# Patient Record
Sex: Female | Born: 1970
Health system: Southern US, Community
[De-identification: ages and names within clinical notes are randomized; demographics above are authoritative.]

## PROBLEM LIST (undated history)

## (undated) DIAGNOSIS — K219 Gastro-esophageal reflux disease without esophagitis: Secondary | ICD-10-CM

## (undated) DIAGNOSIS — R51 Headache: Secondary | ICD-10-CM

## (undated) DIAGNOSIS — E669 Obesity, unspecified: Secondary | ICD-10-CM

## (undated) DIAGNOSIS — R0789 Other chest pain: Secondary | ICD-10-CM

## (undated) DIAGNOSIS — E119 Type 2 diabetes mellitus without complications: Secondary | ICD-10-CM

## (undated) DIAGNOSIS — R519 Headache, unspecified: Secondary | ICD-10-CM

## (undated) DIAGNOSIS — N189 Chronic kidney disease, unspecified: Secondary | ICD-10-CM

## (undated) HISTORY — DX: Chronic kidney disease, unspecified: N18.9

## (undated) HISTORY — PX: COLONOSCOPY: SHX174

## (undated) HISTORY — DX: Type 2 diabetes mellitus without complications: E11.9

## (undated) HISTORY — DX: Other chest pain: R07.89

## (undated) HISTORY — DX: Headache: R51

## (undated) HISTORY — DX: Gastro-esophageal reflux disease without esophagitis: K21.9

## (undated) HISTORY — DX: Obesity, unspecified: E66.9

## (undated) HISTORY — PX: BREAST CYST ASPIRATION: SHX578

## (undated) HISTORY — PX: ABDOMINAL HYSTERECTOMY: SHX81

## (undated) HISTORY — DX: Headache, unspecified: R51.9

---

## 2006-01-10 ENCOUNTER — Other Ambulatory Visit: Admission: RE | Admit: 2006-01-10 | Discharge: 2006-01-10 | Payer: Self-pay | Admitting: Gynecology

## 2006-01-11 ENCOUNTER — Ambulatory Visit: Payer: Self-pay | Admitting: Internal Medicine

## 2006-01-24 ENCOUNTER — Ambulatory Visit: Payer: Self-pay | Admitting: Internal Medicine

## 2006-01-28 ENCOUNTER — Ambulatory Visit: Payer: Self-pay | Admitting: Internal Medicine

## 2006-10-10 ENCOUNTER — Ambulatory Visit: Payer: Self-pay | Admitting: Internal Medicine

## 2006-11-12 ENCOUNTER — Ambulatory Visit: Payer: Self-pay | Admitting: Internal Medicine

## 2006-11-19 ENCOUNTER — Ambulatory Visit: Payer: Self-pay | Admitting: Internal Medicine

## 2006-12-11 ENCOUNTER — Encounter: Admission: RE | Admit: 2006-12-11 | Discharge: 2006-12-11 | Payer: Self-pay | Admitting: Otolaryngology

## 2007-01-21 ENCOUNTER — Ambulatory Visit: Payer: Self-pay | Admitting: Internal Medicine

## 2007-02-17 ENCOUNTER — Other Ambulatory Visit: Admission: RE | Admit: 2007-02-17 | Discharge: 2007-02-17 | Payer: Self-pay | Admitting: Gynecology

## 2007-02-18 ENCOUNTER — Encounter: Admission: RE | Admit: 2007-02-18 | Discharge: 2007-02-18 | Payer: Self-pay | Admitting: Gynecology

## 2007-06-16 ENCOUNTER — Ambulatory Visit: Payer: Self-pay | Admitting: Internal Medicine

## 2007-06-16 LAB — CONVERTED CEMR LAB
ALT: 13 units/L (ref 0–35)
AST: 16 units/L (ref 0–37)
Alkaline Phosphatase: 50 units/L (ref 39–117)
Basophils Relative: 0.4 % (ref 0.0–1.0)
Bilirubin, Direct: 0.1 mg/dL (ref 0.0–0.3)
CO2: 31 meq/L (ref 19–32)
Calcium: 9.3 mg/dL (ref 8.4–10.5)
Chloride: 103 meq/L (ref 96–112)
Eosinophils Relative: 1.5 % (ref 0.0–5.0)
Glucose, Bld: 107 mg/dL — ABNORMAL HIGH (ref 70–99)
Ketones, ur: NEGATIVE mg/dL
Lymphocytes Relative: 46.7 % — ABNORMAL HIGH (ref 12.0–46.0)
Nitrite: NEGATIVE
Platelets: 255 10*3/uL (ref 150–400)
RBC: 3.85 M/uL — ABNORMAL LOW (ref 3.87–5.11)
RDW: 12.4 % (ref 11.5–14.6)
Specific Gravity, Urine: >=1.03 (ref 1.000–1.03)
Total CHOL/HDL Ratio: 2.8
Total Protein, Urine: NEGATIVE mg/dL
Total Protein: 7.2 g/dL (ref 6.0–8.3)
Urine Glucose: NEGATIVE mg/dL
WBC: 4.6 10*3/uL (ref 4.5–10.5)
pH: 5.5 (ref 5.0–8.0)

## 2007-06-18 DIAGNOSIS — R51 Headache: Secondary | ICD-10-CM

## 2007-06-19 ENCOUNTER — Ambulatory Visit: Payer: Self-pay | Admitting: Internal Medicine

## 2007-06-27 ENCOUNTER — Telehealth: Payer: Self-pay | Admitting: Internal Medicine

## 2010-02-27 ENCOUNTER — Ambulatory Visit: Payer: Self-pay | Admitting: Internal Medicine

## 2010-02-27 DIAGNOSIS — E669 Obesity, unspecified: Secondary | ICD-10-CM | POA: Insufficient documentation

## 2010-02-27 LAB — CONVERTED CEMR LAB
AST: 16 units/L (ref 0–37)
Albumin: 4.3 g/dL (ref 3.5–5.2)
Bilirubin, Direct: 0.1 mg/dL (ref 0.0–0.3)
CO2: 28 meq/L (ref 19–32)
Calcium: 9.7 mg/dL (ref 8.4–10.5)
Chloride: 103 meq/L (ref 96–112)
Creatinine, Ser: 0.58 mg/dL (ref 0.40–1.20)
Glucose, Bld: 96 mg/dL (ref 70–99)
LDL Cholesterol: 97 mg/dL (ref 0–99)
Sodium: 140 meq/L (ref 135–145)
TSH: 0.988 microintl units/mL (ref 0.350–4.500)
Total Bilirubin: 0.3 mg/dL (ref 0.3–1.2)
Total CHOL/HDL Ratio: 3

## 2010-02-28 ENCOUNTER — Telehealth: Payer: Self-pay | Admitting: Internal Medicine

## 2010-03-14 ENCOUNTER — Ambulatory Visit: Payer: Self-pay | Admitting: Internal Medicine

## 2010-03-14 DIAGNOSIS — R7303 Prediabetes: Secondary | ICD-10-CM | POA: Insufficient documentation

## 2010-03-31 ENCOUNTER — Encounter (INDEPENDENT_AMBULATORY_CARE_PROVIDER_SITE_OTHER): Payer: Self-pay | Admitting: *Deleted

## 2010-04-12 ENCOUNTER — Ambulatory Visit: Payer: Self-pay | Admitting: Gastroenterology

## 2010-04-19 ENCOUNTER — Ambulatory Visit: Payer: Self-pay | Admitting: Gastroenterology

## 2010-04-19 LAB — HM COLONOSCOPY

## 2010-06-14 ENCOUNTER — Ambulatory Visit: Admit: 2010-06-14 | Payer: Self-pay | Admitting: Internal Medicine

## 2010-06-19 ENCOUNTER — Telehealth: Payer: Self-pay | Admitting: Internal Medicine

## 2010-06-20 NOTE — Assessment & Plan Note (Signed)
Summary: cpx/dt   Vital Signs:  Patient profile:   40 year old female Height:      61 inches Weight:      193 pounds BMI:     36.60 O2 Sat:      99 % on Room air Temp:     97.9 degrees F oral Pulse rate:   66 / minute Pulse rhythm:   regular Resp:     18 per minute BP sitting:   100 / 80  (left arm) Cuff size:   large  Vitals Entered By: Glendell Docker CMA (February 27, 2010 9:48 AM)  O2 Flow:  Room air CC: CPX Is Patient Diabetic? No Pain Assessment Patient in pain? no      Comments fasting for blood work, refill on phentermine and premarin, does not want flu vaccine today   Primary Care Provider:  Dondra Spry DO  CC:  CPX.  History of Present Illness:  40 y/o female for routine cpx   exercise  walks 2 x per week  Current Diet: Breakfast:  oatmeal and banana Lunch: baked chicken, salad or bowl of cereal DInner: avoid fried foods.   baked chicken, baked fish,  vegetables Snacks: yogurt, protein bar Beverage: water or green tea (sweetened),cut out sodas  PAP / Pelvic - Dr. Maple Hudson  (next appt scheduled in 2 weeks)   Preventive Screening-Counseling & Management  Alcohol-Tobacco     Alcohol drinks/day: 0     Smoking Status: never  Caffeine-Diet-Exercise     Caffeine use/day: 1 beverage daily     Does Patient Exercise: yes     Times/week: <3  Allergies (verified): No Known Drug Allergies  Past History:  Past Surgical History: Hysterectomy with BSO colonoscopy 2003 - negative PMH reviewed for relevance  Family History: Mother is age 48  - hypertension, breast cancer Father is age 58 - he was diagnosed with colon cancer in his early 24's. Grandmother has diabetes and history of breast cancer. 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  The patient denies chest pain, syncope, dyspnea on exertion, prolonged cough, abdominal pain, melena, hematochezia, severe indigestion/heartburn, and depression.    Physical Exam  General:  alert and overweight-appearing.   Head:  normocephalic and atraumatic.   Eyes:  pupils equal, pupils round, and pupils reactive to light.   Ears:  R ear normal and L ear normal.   Mouth:  good dentition and pharynx pink and moist.   Neck:  supple and no masses.   Lungs:  normal respiratory effort and normal breath sounds.   Heart:  normal rate, regular rhythm, no murmur, and no gallop.   Abdomen:  soft, non-tender, normal bowel sounds, no masses, no hepatomegaly, and no splenomegaly.   Extremities:  trace left pedal edema and trace right pedal edema.   Neurologic:  cranial nerves II-XII intact and gait normal.   Psych:  normally interactive, good eye contact, not anxious appearing, and not depressed appearing.     Impression & Recommendations:  Problem # 1:  ROUTINE GENERAL MEDICAL EXAM@HEALTH  CARE FACL (ICD-V70.0) PAP and pelvic scheduled with GYN she has fam hx of colon  ca. colonoscopy in 2003 was normal. I suggest f/u colonoscopy  Pap smear: normal (02/08/2009) Td Booster: Td (04/08/2006)   Flu Vax: Fluvax 3+ (08/14/2004)   Chol: 186 (06/16/2007)   HDL: 66.5 (06/16/2007)   LDL: 114 (06/16/2007)   TG: 26 (06/16/2007) TSH: 1.20 (06/16/2007)     Problem # 2:  OBESITY, UNSPECIFIED (ICD-278.00) Pt counseled on diet and exercise. I advised against use of phentermine Orders: T-Basic Metabolic Panel [id number]) T-Hepatic Function [id number]) T-Lipid Profile [id number]) T-TSH [id number]) T- Hemoglobin A1C [id number])  Ht: 61 (02/27/2010)   Wt: 193 (02/27/2010)   BMI: 36.60 (02/27/2010)  Complete Medication List: 1)  Premarin 0.9 Mg Tabs (Estrogens conjugated) .... Take 1 tablet by  mouth once a day  Other Orders: Gastroenterology Referral (GI)  Patient Instructions: 1)  [email or web address]-calorie-[email or web address]/ 2)  Limit to [phone] cal per day. 3)  Please schedule a follow-up appointment in 6 months. Prescriptions: PREMARIN 0.9 MG  TABS (ESTROGENS CONJUGATED) Take 1 tablet by mouth once a day  #90 x 1   Entered and Authorized by:   D. Thomos Lemons DO   Signed by:   D. Thomos Lemons DO on 02/27/2010   Method used:   [facility]  [address] [address] [id number]       [address]       [place], [place]  [address]       Ph: [phone] or [phone]       Fax: [phone]   RxID:   [id number]   Current Allergies (reviewed today): No known allergies    Preventive Care Screening  Pap Smear:    Date:  02/08/2009    Results:  normal

## 2010-06-20 NOTE — Progress Notes (Signed)
Summary: Lab results  Phone Note Outgoing Call   Summary of Call: call pt - blood work shows pt is borderline diabetic.  I suggest pt make appt within 2 weeks Initial call taken by: D. Thomos Lemons DO,  February 28, 2010 5:31 PM  Follow-up for Phone Call        call placed to patient at [phone], she has been advised per Dr Artist Pais instructions. Appointment scheduled for 03/14/2010 @ 9:15 am Follow-up by: Glendell Docker CMA,  March 01, 2010 12:07 PM

## 2010-06-20 NOTE — Assessment & Plan Note (Signed)
Summary: FOLLOW UP  BLOOD WORK/DK   Vital Signs:  Patient profile:   40 year old female Height:      61 inches Weight:      191 pounds BMI:     36.22 O2 Sat:      98 % on Room air Temp:     97.9 degrees F oral Pulse rate:   79 / minute Pulse rhythm:   regular Resp:     18 per minute BP sitting:   94 / 60  (right arm) Cuff size:   large  Vitals Entered By: Glendell Docker CMA (March 14, 2010 9:04 AM)  O2 Flow:  Room air CC: follow-up visit Is Patient Diabetic? No Pain Assessment Patient in pain? no      Comments discuss blood work, discuss tingling in feet and legs   Primary Care Provider:  Dondra Spry DO  CC:  follow-up visit.  History of Present Illness: 40 yo AA female for f/u recent blood work showed A1c 6.2 fam hx of diabetes wt gain  reviewed current diet   Preventive Screening-Counseling & Management  Alcohol-Tobacco     Smoking Status: never  Allergies: No Known Drug Allergies  Past History:  Past Medical History: Obesity Atypical Chest pain secondary to GERD GERD  History of Headache  Past Surgical History: Hysterectomy with BSO colonoscopy 2003 - negative   Family History: Mother is age 17  - hypertension, breast cancer Father is age 73 - he was diagnosed with colon cancer in his early 61's. Grandmother has diabetes and history of breast cancer. 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   Lungs:  normal respiratory effort and normal breath sounds.   Heart:  normal rate, regular rhythm, no murmur, and no gallop.     Impression & Recommendations:  Problem # 1:  HYPERGLYCEMIA (ICD-790.29) A1c 6.2.  I provided educational material on carb counting.  pt will try to limit to 25 grams  per meal  Her updated medication list for this problem includes:    Metformin Hcl 500 Mg Tabs (Metformin hcl) .Marland Kitchen... 1/2 by mouth two times a day x 1 week, then one by mouth two times a day  Labs Reviewed: Creat: 0.58 (02/27/2010)     Complete Medication List: 1)  Premarin 0.9 Mg Tabs (Estrogens conjugated) .... Take 1 tablet by mouth once a day 2)  Metformin Hcl 500 Mg Tabs (Metformin hcl) .... 1/2 by mouth two times a day x 1 week, then one by mouth two times a day  Other Orders: Flu Vaccine 12yrs + (04540) Admin 1st Vaccine (98119)  Patient Instructions: 1)  Please schedule a follow-up appointment in 3 months. 2)  BMP prior to visit, ICD-9:  790.29 3)  HbgA1C prior to visit, ICD-9:  790.29 4)  Please return for lab work one (1) week before your next appointment.  Prescriptions: METFORMIN HCL 500 MG TABS (METFORMIN HCL) 1/2 by mouth two times a day x 1 week, then one by mouth two times a day  #60 x 2   Entered and Authorized by:   D. Thomos Lemons DO   Signed by:   D. Thomos Lemons DO on 03/14/2010   Method used:   [facility]  [address] [id number]       [address]       [place], [place]  [address]       Ph: [phone] or [phone]       Fax: [phone]   RxID:   [id number]    Orders Added: 1)  Flu Vaccine 38yrs + [23762] 2)  Admin 1st Vaccine [90471] 3)  Est. Patient Level III [83151]   Immunizations Administered:  Influenza Vaccine # 1:    Vaccine Type: Fluvax 3+    Site: left deltoid    Mfr: GlaxoSmithKline    Dose: 0.5 ml    Route: IM    Given by: Glendell Docker CMA    Exp. Date: 11/18/2010    Lot #: [id number]    VIS given: 12/13/09 version given March 14, 2010.  Flu Vaccine Consent Questions:    Do you have a history of severe allergic reactions to this vaccine? no    Any prior history of allergic reactions to egg and/or gelatin? no    Do you have a sensitivity to the preservative Thimersol? no    Do  you have a past history of Guillan-Barre Syndrome? no    Do you currently have an acute febrile illness? no    Have you ever had a severe reaction to latex? no    Vaccine information given and explained to patient? yes    Are you currently pregnant? no   Immunizations Administered:  Influenza Vaccine # 1:    Vaccine Type: Fluvax 3+    Site: left deltoid    Mfr: GlaxoSmithKline    Dose: 0.5 ml    Route: IM    Given by: Glendell Docker CMA    Exp. Date: 11/18/2010    Lot #: [id number]    VIS given: 12/13/09 version given March 14, 2010.

## 2010-06-20 NOTE — Letter (Signed)
Summary: [facility] Instructions  [facility]  [address] [place], [place] [address]   Phone: [phone]  Fax: [phone]       ATHALIAH BAUMBACH    11/20/1971    MRN: [id number]        Procedure Day Dorna Bloom: Wednesday 04-19-10     Arrival Time: 9:30 a.m.     Procedure Time: 10:30 a.m.     Location of Procedure:                    _x _  [facility] (4th Floor)   PREPARATION FOR COLONOSCOPY WITH MOVIPREP   Starting 5 days prior to your procedure  04-14-10  do not eat nuts, seeds, popcorn, corn, beans, peas,  salads, or any raw vegetables.  Do not take any fiber supplements (e.g. Metamucil, Citrucel, and Benefiber).  THE DAY BEFORE YOUR PROCEDURE         DATE:  04-18-10   DAY: Tuesday  1.  Drink clear liquids the entire day-NO SOLID FOOD  2.  Do not drink anything colored red or purple.  Avoid juices with pulp.  No orange juice.  3.  Drink at least 64 oz. (8 glasses) of fluid/clear liquids during the day to prevent dehydration and help the prep work efficiently.  CLEAR LIQUIDS INCLUDE: Water Jello Ice Popsicles Tea (sugar ok, no milk/cream) Powdered fruit flavored drinks Coffee (sugar ok, no milk/cream) Gatorade Juice: apple, white grape, white cranberry  Lemonade Clear bullion, consomm, broth Carbonated beverages (any kind) Strained chicken noodle soup Hard Candy                             4.  In the morning, mix first dose of MoviPrep solution:    Empty 1 Pouch A and 1 Pouch B into the disposable container    Add lukewarm drinking water to the top line of the container. Mix to dissolve    Refrigerate (mixed solution should be used within 24 hrs)  5.  Begin drinking the prep at 5:00 p.m. The MoviPrep container is divided by 4 marks.   Every 15 minutes drink the solution down to the next mark (approximately 8 oz) until the full liter is complete.   6.  Follow completed prep with 16 oz of clear liquid of your choice (Nothing red or  purple).  Continue to drink clear liquids until bedtime.  7.  Before going to bed, mix second dose of MoviPrep solution:    Empty 1 Pouch A and 1 Pouch B into the disposable container    Add lukewarm drinking water to the top line of the container. Mix to dissolve    Refrigerate  THE DAY OF YOUR PROCEDURE      DATE:  04-19-10   DAY:  Wednesday  Beginning at  5:30 a.m. (5 hours before procedure):         1. Every 15 minutes, drink the solution down to the next mark (approx 8 oz) until the full liter is complete.  2. Follow completed prep with 16 oz. of clear liquid of your choice.    3. You may drink clear liquids until  8:30 a.m. (2 HOURS BEFORE PROCEDURE).   MEDICATION INSTRUCTIONS  Unless otherwise instructed, you should take regular prescription medications with a small sip of water   as early as possible the morning of your procedure.  Diabetic patients - see separate instructions.  OTHER INSTRUCTIONS  You will need a responsible adult at least 39 years of age to accompany you and drive you home.   This person must remain in the waiting room during your procedure.  Wear loose fitting clothing that is easily removed.  Leave jewelry and other valuables at home.  However, you may wish to bring a book to read or  an iPod/MP3 player to listen to music as you wait for your procedure to start.  Remove all body piercing jewelry and leave at home.  Total time from sign-in until discharge is approximately 2-3 hours.  You should go home directly after your procedure and rest.  You can resume normal activities the  day after your procedure.  The day of your procedure you should not:   Drive   Make legal decisions   Operate machinery   Drink alcohol   Return to work  You will receive specific instructions about eating, activities and medications before you leave.    The above instructions have been reviewed and explained to me by   Clide Cliff,  RN_______________________    I fully understand and can verbalize these instructions _____________________________ Date _________

## 2010-06-20 NOTE — Procedures (Signed)
Summary: Colonoscopy  Patient: Beverly Townsend Note: All result statuses are Final unless otherwise noted.  Tests: (1) Colonoscopy (COL)   COL Colonoscopy           DONE     [facility]     [address]     [place], [place]  [address]           COLONOSCOPY PROCEDURE REPORT           PATIENT:  Beverly Townsend, Beverly Townsend  MR#:  [id number]     BIRTHDATE:  [birth year], 39 yrs. old  GENDER:  female           ENDOSCOPIST:  Barbette Hair. Arlyce Dice, MD     Referred by:  Thomos Lemons, DO           PROCEDURE DATE:  04/19/2010     PROCEDURE:  Diagnostic Colonoscopy     ASA CLASS:  Class II     INDICATIONS:  1) Elevated Risk Screening  2) family history of     colon cancer Father in late 20's           MEDICATIONS:   Fentanyl 75 mcg IV, Versed 6 mg IV           DESCRIPTION OF PROCEDURE:   After the risks benefits and     alternatives of the procedure were thoroughly explained, informed     consent was obtained.  Digital rectal exam was performed and     revealed no abnormalities.   The LB [id number] endoscope     was introduced through the anus and advanced to the cecum, which     was identified by both the appendix and ileocecal valve, without     limitations.  The quality of the prep was excellent, using     MoviPrep.  The instrument was then slowly withdrawn as the colon     was fully examined.     <<PROCEDUREIMAGES>>           FINDINGS:  A normal appearing cecum, ileocecal valve, and     appendiceal orifice were identified. The ascending, hepatic     flexure, transverse, splenic flexure, descending, sigmoid colon,     and rectum appeared unremarkable (see image1, image2, image4,     image6, image8, image11, and image12).   Retroflexed views in the     rectum revealed no abnormalities.    The time to cecum =  3.50     minutes. The scope was then withdrawn (time =  5.0  min) from the     patient and the procedure completed.           COMPLICATIONS:  None           ENDOSCOPIC  IMPRESSION:     1) Normal colon     RECOMMENDATIONS:     1) Given your significant family history of colon cancer, you     should have a repeat colonoscopy in 5 years           REPEAT EXAM:   5 year(s) Colonoscopy           ______________________________     Barbette Hair. Arlyce Dice, MD           CC:           n.     eSIGNED:   Barbette Hair. Kaplan at 04/19/2010 11:57 AM           Lenord Fellers, [id number]  Note:  An exclamation mark (!) indicates a result that was not dispersed into the flowsheet. Document Creation Date: 04/19/2010 11:58 AM _______________________________________________________________________  (1) Order result status: Final Collection or observation date-time: 04/19/2010 11:54 Requested date-time:  Receipt date-time:  Reported date-time:  Referring Physician:   Ordering Physician: Melvia Heaps [phone]) Specimen Source:  Source: Launa Grill Order Number: [id number] Lab site:   Appended Document: Colonoscopy    Clinical Lists Changes  Observations: Added new observation of COLONNXTDUE: 03/2015 (04/19/2010 13:04)

## 2010-06-20 NOTE — Miscellaneous (Signed)
Summary: DIRECT COLON STRONG FAM HX/YF  Clinical Lists Changes  Medications: Added new medication of MOVIPREP 100 GM  SOLR (PEG-KCL-NACL-NASULF-NA ASC-C) As per prep instructions. - Signed Rx of MOVIPREP 100 GM  SOLR (PEG-KCL-NACL-NASULF-NA ASC-C) As per prep instructions.;  #1 x 0;  Signed;  Entered by: Clide Cliff RN;  Authorized by: Louis Meckel MD;  Method [facility]: [facility]  [address] [facility] [address], [place], [place], [place]  [address], Ph: [phone] or [phone], Fax: [phone] Observations: Added new observation of ALLERGY REV: Done (04/12/2010 10:17)    Prescriptions: MOVIPREP 100 GM  SOLR (PEG-KCL-NACL-NASULF-NA ASC-C) As per prep instructions.  #1 x 0   Entered by:   Clide Cliff RN   Authorized by:   Louis Meckel MD   Signed by:   Clide Cliff RN on 04/12/2010   Method used:   [facility]        [facility]  [address] [facility]       [address]       [place], [place]  [address]       Ph: [phone] or [phone]       Fax: [phone]   RxID:   [id number]

## 2010-06-20 NOTE — Letter (Signed)
Summary: Diabetic Instructions   [facility]  [address] [place], [place] [address]   Phone: [phone]  Fax: [phone]    Beverly Townsend 16-Jun-1970 MRN: [id number]   _x  _   ORAL DIABETIC MEDICATION INSTRUCTIONS  The day before your procedure:   Take your diabetic pill as you do normally  The day of your procedure:   Do not take your diabetic pill    We will check your blood sugar levels during the admission process and again in Recovery before discharging you home  ________________________________________________________________________  _  _   INSULIN (LONG ACTING) MEDICATION INSTRUCTIONS (Lantus, NPH, 70/30, Humulin, Novolin-N)   The day before your procedure:   Take  your regular evening dose    The day of your procedure:   Do not take your morning dose    _  _   INSULIN (SHORT ACTING) MEDICATION INSTRUCTIONS (Regular, Humulog, Novolog)   The day before your procedure:   Do not take your evening dose   The day of your procedure:   Do not take your morning dose   _  _   INSULIN PUMP MEDICATION INSTRUCTIONS  We will contact the physician managing your diabetic care for written dosage instructions for the day before your procedure and the day of your procedure.  Once we have received the instructions, we will contact you.

## 2010-06-22 ENCOUNTER — Encounter: Payer: Self-pay | Admitting: Internal Medicine

## 2010-06-28 NOTE — Progress Notes (Signed)
Summary: 90 day refill Metformin  Phone Note Refill Request Message from:  Fax from Pharmacy on June 19, 2010 8:57 AM  Refills Requested: Medication #1:  METFORMIN HCL 500 MG TABS 1/2 by mouth two times a day x 1 week   Dosage confirmed as above?Dosage Confirmed   Brand Name Necessary? No   Supply Requested: 3 months   [facility] [address] [place] [address] [phone]   Method Requested: Electronic Next Appointment Scheduled: 07-14-10 [place]  Initial call taken by: Roselle Locus,  June 19, 2010 8:57 AM  Follow-up for Phone Call        [facility] day supply rx. Nicki Guadalajara Fergerson CMA (AAMA)  June 19, 2010 9:51 AM     New/Updated Medications: METFORMIN HCL 500 MG TABS (METFORMIN HCL) Take one tablet by mouth two times a day. Prescriptions: METFORMIN HCL 500 MG TABS (METFORMIN HCL) Take one tablet by mouth two times a day.  #180 x 0   Entered by:   Mervin Kung CMA (AAMA)   Authorized by:   D. Thomos Lemons DO   Signed by:   Mervin Kung CMA (AAMA) on 06/19/2010   Method used:   [facility]        [facility]  [address] [facility]       [address]       [place], [place]  [address]       Ph: [phone] or [phone]       Fax: [phone]   RxID:   [id number] METFORMIN HCL 500 MG TABS (METFORMIN HCL) Take one tablet by mouth two times a day.  #60 x 0   Entered by:   Mervin Kung CMA (AAMA)   Authorized by:   D. Thomos Lemons DO   Signed by:   Mervin Kung CMA (AAMA) on 06/19/2010   Method used:   [facility]        [facility]  [address] [facility]       [address]       [place], [place]  [address]       Ph: [phone] or [phone]       Fax: [phone]   RxID:   [id number]

## 2010-07-14 ENCOUNTER — Ambulatory Visit: Payer: Self-pay | Admitting: Family

## 2010-07-18 ENCOUNTER — Ambulatory Visit (INDEPENDENT_AMBULATORY_CARE_PROVIDER_SITE_OTHER): Payer: BC Managed Care – PPO | Admitting: Internal Medicine

## 2010-07-18 ENCOUNTER — Encounter: Payer: Self-pay | Admitting: Internal Medicine

## 2010-07-18 DIAGNOSIS — M549 Dorsalgia, unspecified: Secondary | ICD-10-CM | POA: Insufficient documentation

## 2010-07-18 DIAGNOSIS — R7309 Other abnormal glucose: Secondary | ICD-10-CM

## 2010-07-18 LAB — CONVERTED CEMR LAB
Bilirubin Urine: NEGATIVE
Glucose, Urine, Semiquant: NEGATIVE
Ketones, urine, test strip: NEGATIVE
Urobilinogen, UA: 0.2
pH: 5

## 2010-07-20 ENCOUNTER — Encounter: Payer: Self-pay | Admitting: Internal Medicine

## 2010-07-20 ENCOUNTER — Telehealth: Payer: Self-pay | Admitting: Internal Medicine

## 2010-07-27 NOTE — Progress Notes (Signed)
Summary: work note  Phone Note Call from Patient Call back at [phone]   Caller: Patient Call For: D. Thomos Lemons DO Summary of Call: Pt states she is able to sleep at night with the medication that she was prescribed but her pain is worse during the day after she moves around. States that the pain is not worse but does not feel improved.  She is requesting a note to excuse her from work this weekend. Pt states she is only working weekends at present.   Pt wants note faxed to her at [phone].  Please advise.  Follow-up for Phone Call        okay to provide letter Follow-up by: D. Thomos Lemons DO,  July 20, 2010 3:35 PM  Additional Follow-up for Phone Call Additional follow up Details #1::        Letter printed and faxed to above number. Nicki Guadalajara Fergerson CMA Duncan Dull)  July 20, 2010 4:26 PM

## 2010-07-27 NOTE — Letter (Signed)
Summary: Out of Work  Adult nurse at [employer]. [address]   [place], [place] [address]   Phone: [phone]  Fax: [phone]    July 20, 2010   Employee:  Beverly Townsend    To Whom It May Concern:   For Medical reasons, please excuse the above named employee from work for the following dates:  Start:   07-21-10  End:   07-23-10.  Pt may return to work 07-28-10.  If you need additional information, please feel free to contact our office.   Sincerely,    Mervin Kung CMA (AAMA) Thomos Lemons, DO

## 2010-08-01 LAB — GLUCOSE, CAPILLARY
Glucose-Capillary: 93 mg/dL (ref 70–99)
Glucose-Capillary: 98 mg/dL (ref 70–99)

## 2010-08-08 NOTE — Assessment & Plan Note (Signed)
Summary: 3 MONTH FOLLO WUP/MHF   Vital Signs:  Patient profile:   39 year old female Height:      61 inches Weight:      188 pounds BMI:     35.65 O2 Sat:      99 % on Room air Temp:     97.8 degrees F oral Pulse rate:   78 / minute BP sitting:   90 / 60  (right arm) Cuff size:   large  Vitals Entered By: Glendell Docker CMA (July 18, 2010 10:39 AM)  O2 Flow:  Room air CC: 3 Month Follow up Is Patient Diabetic? No Pain Assessment Patient in pain? yes     Location: lower back Intensity: 8 Type: sharp Onset of pain  With activity Comments c/o lower right side back pain-may have pulled a muscle sudden onset Sunday, pain with movement and causing difficulty sleeping     Last PAP Result appt scheduled for 2 weeks   Primary Care Provider:  D. Robert Xzayvion Vaeth DO  CC:  3 Month Follow up.  History of Present Illness: 39 y/o female c/o low back pain onset sunday shart pain when I move worse with laying on back and right side  no urinary symptoms no fever  Preventive Screening-Counseling & Management  Alcohol-Tobacco     Smoking Status: never  Allergies (verified): No Known Drug Allergies  Past History:  Past Medical History: Obesity Atypical Chest pain secondary to GERD GERD   History of Headache  Past Surgical History: Hysterectomy with BSO colonoscopy 2003 - negative    Physical Exam  General:  alert, well-developed, and well-nourished.   Nose:  nose piercing noted.   Lungs:  normal respiratory effort and normal breath sounds.   Heart:  normal rate, regular rhythm, and no gallop.     Impression & Recommendations:  Problem # 1:  BACK PAIN (ICD-724.5) probable strain conservative tx Patient advised to call office if symptoms persist or worsen.  Her updated medication list for this problem includes:    Cyclobenzaprine Hcl 5 Mg Tabs (Cyclobenzaprine hcl) ..... One by mouth at bedtime as needed for back pain  Problem # 2:  HYPERGLYCEMIA  (ICD-790.29)  Her updated medication list for this problem includes:    Metformin Hcl 500 Mg Tabs (Metformin hcl) ..... Take one tablet by mouth two times a day.  Orders: T-Basic Metabolic Panel (80048-22910) T- Hemoglobin A1C (83036-23375)  Complete Medication List: 1)  Premarin 0.9 Mg Tabs (Estrogens conjugated) .... Take 1 tablet by mouth once a day 2)  Metformin Hcl 500 Mg Tabs (Metformin hcl) .... Take one tablet by mouth two times a day. 3)  Cyclobenzaprine Hcl 5 Mg Tabs (Cyclobenzaprine hcl) .... One by mouth at bedtime as needed for back pain  Other Orders: UA Dipstick w/o Micro (manual) (81002)  Patient Instructions: 1)  Please schedule a follow-up appointment in 4 months. 2)  Call our office if your symptoms do not  improve or gets worse. Prescriptions: CYCLOBENZAPRINE HCL 5 MG TABS (CYCLOBENZAPRINE HCL) one by mouth at bedtime as needed for back pain  #30 x 0   Entered and Authorized by:   D. Robert Zinedine Ellner DO   Signed by:   D. Robert Deltha Bernales DO on 07/18/2010   Method used:   Electronically to        [facility]  [address] [address] [id number] (retail)       [address]       [place]  [place], [place]  [address]       Ph: [phone] or [phone]       Fax: [phone]   RxID:   [id number] PREMARIN 0.9 MG  TABS (ESTROGENS CONJUGATED) Take 1 tablet by mouth once a day  #90 x 1   Entered and Authorized by:   D. Thomos Lemons DO   Signed by:   D. Thomos Lemons DO on 07/18/2010   Method used:   Electronically to        [facility]  [address] [id number] (retail)       [address]       [place], [place]  [address]       Ph: [phone] or [phone]       Fax: [phone]   RxID:   [id number] METFORMIN HCL 500 MG TABS (METFORMIN HCL) Take one tablet by mouth two times a day.  #180 x 1   Entered and Authorized by:   D. Thomos Lemons DO   Signed by:   D. Thomos Lemons DO on 07/18/2010   Method used:   [facility]ically to        [facility]  [address] [id number]  (retail)       [address]       [place], [place]  [address]       Ph: [phone] or [phone]       Fax: [phone]   RxID:   [id number]    Orders Added: 1)  UA Dipstick w/o Micro (manual) [81002] 2)  T-Basic Metabolic Panel [80048-22910] 3)  T- Hemoglobin A1C [83036-23375] 4)  Est. Patient Level III [16967]    Current Allergies (reviewed today): No known allergies    Preventive Care Screening  Pap Smear:    Date:  07/18/2010    Results:  appt scheduled for 2 weeks   Laboratory Results   Urine Tests    Routine Urinalysis   Color: yellow Appearance: Clear Glucose: negative   (Normal Range: Negative) Bilirubin: negative   (Normal Range: Negative) Ketone: negative   (Normal Range: Negative) Spec. Gravity: 1.020   (Normal Range: 1.003-1.035) Blood: trace-intact   (Normal Range: Negative) pH: 5.0   (Normal Range: 5.0-8.0) Protein: trace   (Normal Range: Negative) Urobilinogen: 0.2   (Normal Range: 0-1) Nitrite: negative   (Normal Range: Negative) Leukocyte Esterace: negative   (Normal Range: Negative)

## 2010-08-30 ENCOUNTER — Ambulatory Visit: Payer: Self-pay | Admitting: Internal Medicine

## 2010-10-06 NOTE — Assessment & Plan Note (Signed)
[facility]                             PRIMARY CARE OFFICE NOTE   NAME:Beverly Townsend, Beverly Townsend                    MRN:          [id number]  DATE:01/11/2006                            DOB:          September 16, 1970    CHIEF COMPLAINT:  New patient/physical.   HISTORY OF PRESENT ILLNESS:  Patient is a 40 year old African American  female here to establish primary care.  She has not had a primary care  physician in the past.  She does have an OB/GYN in the area, and,  apparently, due to an endometrial infection and cyst, underwent hysterectomy  in 1992, and also bilateral salpingo-oophorectomy.  She has been on Premarin  since that time.  She has been healthy otherwise.  Denies any history of  heart disease, high blood pressure, high cholesterol, although she has not  had her cholesterol checked in the past.  She does report some history of  chronic headaches and states that she takes a BC Powder at least once per  week.  Approximately 2 weeks ago, she had an episode of nonexertional chest  pain; was evaluated at Urgent Care in [place].  Patient had a negative  EKG and blood work.  She has not had any reoccurrence of that pain in the  past.  She exercises on a regular basis, including using the treadmill and a  stepper and has never experienced exertional chest discomfort in the past.  She denies any history of heartburn.   PAST MEDICAL HISTORY SUMMARY:  1. Status post hysterectomy in 1992 secondary to endometrial infection.  2. Family history of colon cancer and blood in her stool; status post      colonoscopy in 2004, reported normal.  3. History of headache.  4. History of blood transfusion.   CURRENT MEDICATIONS:  Premarin 2.25 mg 1 a day.   ALLERGY TO MEDICATION:  NONE KNOWN.   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   FAMILY HISTORY:  Mother alive at age 60; has hypertension.  Father 64; has  history of colon cancer diagnosed in his early 50s.  Paternal grandmother  has type 2 diabetes.  No other cancer in the family.   HABITS:  She does not drink, does not smoke.   REVIEW OF SYSTEMS:  No fevers, chills.  No HEENT symptoms.  No shortness of  breath.  Cardiovascular complaints as noted above.  Denies any history of  palpitations.  No heartburn, nausea, vomiting.  No dark stools or blood in  her stool, dysuria, frequency, urgency.  No polyuria or polydipsia.  All  other systems negative.   PHYSICAL EXAMINATION:  VITAL SIGNS:  Height is 5 feet 1 inch, weight is 182  pounds.  Temperature is 97.4, pulse is 78, and BP is 105/67.  GENERAL:  The patient is a pleasant, overweight 40 year old [place] American  female, pleasant, in no apparent distress.  HEENT:  Normocephalic, atraumatic.  Pupils are equal and reactive to light  bilaterally.  Extraocular muscles were intact.  The patient was anicteric.  Conjunctivae was within  normal limits.  External auditory canal and tympanic  membranes were clear bilaterally.  Hearing was grossly normal.  Oropharyngeal exam unremarkable.  NECK EXAM:  Supple.  No adenopathy, carotid bruit, thyromegaly, or thyroid  nodules.  CHEST EXAM:  Normal respiratory effort.  Chest was clear to auscultation  bilaterally.  No rhonchi, rales or wheezing.  CARDIOVASCULAR:  Regular rate and rhythm.  No significant murmurs, rubs, or  gallops appreciated.  ABDOMEN:  Abdomen was soft, nontender.  Positive bowel sounds.  No  organomegaly.  MUSCULOSKELETAL EXAM:  No clubbing, cyanosis, or edema.  Skin was warm and  dry.  NEUROLOGIC:  Cranial nerves II-XII were grossly intact.  She was nonfocal.   IMPRESSIONS:  1. Atypical chest pain.  2. Status post hysterectomy, on hormone replacement.  3. History of chronic headache.  4. Health maintenance.   RECOMMENDATIONS:  Patient's history is somewhat atypical.  I doubt cardiac  etiology.  We will try to obtain records  from Urgent Care, including blood  work.  Patient will be sent for screening labs, including a cholesterol and  blood sugar for risk stratification.  She is to try taking Prilosec over-the-  counter before her followup visit.  Due to history of NSAID use, patient may  have gastritis.   On followup visit, we will further discuss her recurrent headache, whether  migraine or tension.  Followup time in approximately 2-3 weeks.                                   Barbette Hair. Artist Pais, DO   RDY/MedQ  DD:  01/11/2006  DT:  01/12/2006  Job #:  [id number]

## 2010-11-13 ENCOUNTER — Ambulatory Visit: Payer: BC Managed Care – PPO | Admitting: Internal Medicine

## 2011-03-22 ENCOUNTER — Ambulatory Visit: Payer: BC Managed Care – PPO | Admitting: Internal Medicine

## 2011-04-02 ENCOUNTER — Encounter: Payer: Self-pay | Admitting: Internal Medicine

## 2011-04-02 ENCOUNTER — Ambulatory Visit (INDEPENDENT_AMBULATORY_CARE_PROVIDER_SITE_OTHER): Payer: BC Managed Care – PPO | Admitting: Internal Medicine

## 2011-04-02 VITALS — BP 110/64 | HR 66 | Temp 97.7°F | Resp 18 | Wt 183.0 lb

## 2011-04-02 DIAGNOSIS — R0789 Other chest pain: Secondary | ICD-10-CM

## 2011-04-02 DIAGNOSIS — R739 Hyperglycemia, unspecified: Secondary | ICD-10-CM

## 2011-04-02 DIAGNOSIS — R079 Chest pain, unspecified: Secondary | ICD-10-CM

## 2011-04-02 DIAGNOSIS — Z87898 Personal history of other specified conditions: Secondary | ICD-10-CM

## 2011-04-02 DIAGNOSIS — Z800 Family history of malignant neoplasm of digestive organs: Secondary | ICD-10-CM

## 2011-04-02 DIAGNOSIS — R51 Headache: Secondary | ICD-10-CM

## 2011-04-02 DIAGNOSIS — R7309 Other abnormal glucose: Secondary | ICD-10-CM

## 2011-04-02 LAB — BASIC METABOLIC PANEL
BUN: 11 mg/dL (ref 6–23)
CO2: 23 mEq/L (ref 19–32)
Calcium: 9.4 mg/dL (ref 8.4–10.5)
Creat: 0.6 mg/dL (ref 0.50–1.10)
Glucose, Bld: 88 mg/dL (ref 70–99)
Sodium: 139 mEq/L (ref 135–145)

## 2011-04-02 LAB — HEMOGLOBIN A1C: Hgb A1c MFr Bld: 6.2 % — ABNORMAL HIGH (ref <5.7)

## 2011-04-02 MED ORDER — DICLOFENAC SODIUM 50 MG PO TBEC: DELAYED_RELEASE_TABLET | ORAL | Status: AC

## 2011-04-02 MED ORDER — ESTROGENS CONJUGATED 0.9 MG PO TABS: 0.9000 mg | ORAL_TABLET | Freq: Every day | ORAL | Status: DC

## 2011-04-02 NOTE — Progress Notes (Signed)
  Subjective:    Patient ID: Beverly Townsend, female    DOB: Jun 26, 1970, 40 y.o.   MRN: [id number]  HPI Pt presents to clinic for followup of multiple medical problems.  Notes atypical cp described as sharp and lasting secs only. Pain is nonexertional and without radiation. Has one month h/o right sided ha's without neurologic sx's. No associated n/v or visual changes. Tolerating metformin for hyperglycemia. Notes fam hx of colon ca with father. No changes in bowel habits or blood in stool. No other complaints.  Past Medical History  Diagnosis Date  . Obesity   . GERD (gastroesophageal reflux disease)   . Atypical chest pain     secondary to GERD  . Head ache    Past Surgical History  Procedure Date  . Abdominal hysterectomy     with BSO    reports that she has never smoked. She has never used smokeless tobacco. She reports that she does not drink alcohol. Her drug history not on file. family history includes Cancer in her father and mother; Diabetes in an unspecified family member; Hypertension in her mother; and Stroke in her paternal grandmother. No Known Allergies   Review of Systems see hpi     Objective:   Physical Exam  Nursing note and vitals reviewed. Constitutional: She appears well-developed and well-nourished. No distress.  HENT:  Head: Normocephalic and atraumatic.  Eyes: Conjunctivae and EOM are normal. Pupils are equal, round, and reactive to light. No scleral icterus.  Neck: Neck supple.  Cardiovascular: Normal rate, regular rhythm and normal heart sounds.  Exam reveals no gallop and no friction rub.   No murmur heard. Pulmonary/Chest: Effort normal and breath sounds normal. No respiratory distress. She has no wheezes. She has no rales.  Neurological: She is alert. No cranial nerve deficit. Coordination normal.  Skin: Skin is warm and dry. She is not diaphoretic.  Psychiatric: She has a normal mood and affect.          Assessment & Plan:

## 2011-04-08 DIAGNOSIS — R0789 Other chest pain: Secondary | ICD-10-CM | POA: Insufficient documentation

## 2011-04-08 DIAGNOSIS — Z800 Family history of malignant neoplasm of digestive organs: Secondary | ICD-10-CM | POA: Insufficient documentation

## 2011-04-08 NOTE — Assessment & Plan Note (Signed)
Atypical. Suspect msk etiology. ekg shows nsr with nl intervals and axis

## 2011-04-08 NOTE — Assessment & Plan Note (Signed)
Obtain chem7 and a1c 

## 2011-04-08 NOTE — Assessment & Plan Note (Signed)
Recommend gi consult at age 40.

## 2011-04-08 NOTE — Assessment & Plan Note (Signed)
Attempt nsaid with food and no other nsaid

## 2011-07-30 ENCOUNTER — Ambulatory Visit: Payer: BC Managed Care – PPO | Admitting: Internal Medicine

## 2011-07-30 DIAGNOSIS — Z0289 Encounter for other administrative examinations: Secondary | ICD-10-CM

## 2012-07-03 ENCOUNTER — Encounter (HOSPITAL_COMMUNITY): Payer: Self-pay

## 2012-07-03 ENCOUNTER — Emergency Department (HOSPITAL_COMMUNITY)
Admission: EM | Admit: 2012-07-03 | Discharge: 2012-07-04 | Disposition: A | Discharge status: Home / Self Care | Payer: PRIVATE HEALTH INSURANCE | Attending: Emergency Medicine | Admitting: Emergency Medicine

## 2012-07-03 ENCOUNTER — Emergency Department (HOSPITAL_COMMUNITY): Payer: PRIVATE HEALTH INSURANCE

## 2012-07-03 DIAGNOSIS — W19XXXA Unspecified fall, initial encounter: Secondary | ICD-10-CM

## 2012-07-03 DIAGNOSIS — E669 Obesity, unspecified: Secondary | ICD-10-CM | POA: Insufficient documentation

## 2012-07-03 DIAGNOSIS — Y929 Unspecified place or not applicable: Secondary | ICD-10-CM | POA: Insufficient documentation

## 2012-07-03 DIAGNOSIS — Z8719 Personal history of other diseases of the digestive system: Secondary | ICD-10-CM | POA: Insufficient documentation

## 2012-07-03 DIAGNOSIS — IMO0002 Reserved for concepts with insufficient information to code with codable children: Secondary | ICD-10-CM | POA: Insufficient documentation

## 2012-07-03 DIAGNOSIS — S46909A Unspecified injury of unspecified muscle, fascia and tendon at shoulder and upper arm level, unspecified arm, initial encounter: Secondary | ICD-10-CM | POA: Insufficient documentation

## 2012-07-03 DIAGNOSIS — S4980XA Other specified injuries of shoulder and upper arm, unspecified arm, initial encounter: Secondary | ICD-10-CM | POA: Insufficient documentation

## 2012-07-03 DIAGNOSIS — W1789XA Other fall from one level to another, initial encounter: Secondary | ICD-10-CM | POA: Insufficient documentation

## 2012-07-03 DIAGNOSIS — Z79899 Other long term (current) drug therapy: Secondary | ICD-10-CM | POA: Insufficient documentation

## 2012-07-03 DIAGNOSIS — Y9301 Activity, walking, marching and hiking: Secondary | ICD-10-CM | POA: Insufficient documentation

## 2012-07-03 NOTE — ED Notes (Signed)
Pt is [employer] employee who was coming in to work when she slipped on ice, fell backward and landed on her back and left shoulder.  Pain to mid/lower back and left shoulder only, denies loc

## 2012-07-04 MED ORDER — HYDROCODONE-ACETAMINOPHEN 5-325 MG PO TABS
1.0000 | ORAL_TABLET | Freq: Once | ORAL | Status: AC
  Administered 2012-07-04: 1 via ORAL

## 2012-07-04 MED ORDER — HYDROCODONE-ACETAMINOPHEN 5-325 MG PO TABS: 1.0000 | ORAL_TABLET | Freq: Four times a day (QID) | ORAL | Status: DC | PRN

## 2012-07-04 MED ORDER — HYDROCODONE-ACETAMINOPHEN 5-325 MG PO TABS
ORAL_TABLET | ORAL | Status: AC
  Administered 2012-07-04: 1 via ORAL
  Filled 2012-07-04: qty 1

## 2012-07-04 NOTE — ED Provider Notes (Signed)
History     CSN: [id number]  Arrival date & time 07/03/12  2336   First MD Initiated Contact with Patient 07/03/12 2339      Chief Complaint  Patient presents with  . Fall    (Consider location/radiation/quality/duration/timing/severity/associated sxs/prior treatment) Patient is a 42 y.o. female presenting with fall. The history is provided by the patient (pt slipped and fell on the ice.  she has back and left shoulder pain). No language interpreter was used.  Fall The accident occurred less than 1 hour ago. The fall occurred while walking. She fell from a height of 1 to 2 ft. She landed on concrete. The point of impact was the left shoulder (lower back). The pain is present in the left shoulder (lower back). The pain is at a severity of 5/10. The pain is moderate. She was not ambulatory at the scene. There was no drug use involved in the accident. Pertinent negatives include no abdominal pain, no hematuria and no headaches. The symptoms are aggravated by activity.    Past Medical History  Diagnosis Date  . Obesity   . GERD (gastroesophageal reflux disease)   . Atypical chest pain     secondary to GERD  . Head ache     Past Surgical History  Procedure Laterality Date  . Abdominal hysterectomy      with BSO    Family History  Problem Relation Age of Onset  . Cancer Mother     breast  . Hypertension Mother   . Cancer Father     colon in early 64's  . Stroke Paternal Grandmother   . Diabetes      History  Substance Use Topics  . Smoking status: Never Smoker   . Smokeless tobacco: Never Used  . Alcohol Use: No    OB History   Grav Para Term Preterm Abortions TAB SAB Ect Mult Living                  Review of Systems  Constitutional: Negative for fatigue.  HENT: Negative for congestion, sinus pressure and ear discharge.   Eyes: Negative for discharge.  Respiratory: Negative for cough.   Cardiovascular: Negative for chest pain.  Gastrointestinal: Negative  for abdominal pain and diarrhea.  Genitourinary: Negative for frequency and hematuria.  Musculoskeletal: Positive for back pain.       Left shoulder pain  Skin: Negative for rash.  Neurological: Negative for seizures and headaches.  Psychiatric/Behavioral: Negative for hallucinations.    Allergies  Review of patient's allergies indicates no known allergies.  Home Medications   Current Outpatient Rx  Name  Route  Sig  Dispense  Refill  . estrogens, conjugated, (PREMARIN) 0.9 MG tablet   Oral   Take 1 tablet (0.9 mg total) by mouth daily. Take daily for 21 days then do not take for 7 days.   30 tablet   6   . HYDROcodone-acetaminophen (NORCO/VICODIN) 5-325 MG per tablet   Oral   Take 1 tablet by mouth every 6 (six) hours as needed for pain.   20 tablet   0   . metFORMIN (GLUCOPHAGE) 500 MG tablet   Oral   Take 500 mg by mouth 2 (two) times daily with meals.             BP 103/57  Pulse 70  Temp(Src) 98.1 F (36.7 C) (Oral)  Resp 18  Ht 5\' 1"  (1.549 m)  Wt 180 lb (81.647 kg)  BMI 34.03 kg/m2  SpO2 100%  Physical Exam  Constitutional: She is oriented to person, place, and time. She appears well-developed.  HENT:  Head: Normocephalic and atraumatic.  Eyes: Conjunctivae and EOM are normal. No scleral icterus.  Neck: Neck supple. No thyromegaly present.  Cardiovascular: Normal rate and regular rhythm.  Exam reveals no gallop and no friction rub.   No murmur heard. Pulmonary/Chest: No stridor. She has no wheezes. She has no rales. She exhibits no tenderness.  Abdominal: She exhibits no distension. There is no tenderness. There is no rebound.  Musculoskeletal: She exhibits no edema.  Tender lumbar spine and left shoulder.  Full range of motion  Lymphadenopathy:    She has no cervical adenopathy.  Neurological: She is oriented to person, place, and time. Coordination normal.  Skin: No rash noted. No erythema.  Psychiatric: She has a normal mood and affect. Her  behavior is normal.    ED Course  Procedures (including critical care time)  Labs Reviewed - No data to display Dg Thoracic Spine W/swimmers  07/04/2012  *RADIOLOGY REPORT*  Clinical Data: Fall.  Back pain.  THORACIC SPINE - 2 VIEW + SWIMMERS  Comparison: None.  Findings: Thoracic vertebral alignment appears normal.  No thoracic spine fracture or acute subluxation is identified.  No acute thoracic spine findings noted.  IMPRESSION:  No significant abnormality identified.   Original Report Authenticated By: Gaylyn Rong, M.D.    Dg Lumbar Spine Complete  07/04/2012  *RADIOLOGY REPORT*  Clinical Data: Fall.  Left shoulder pain.  Back pain.  LUMBAR SPINE - COMPLETE 4+ VIEW  Comparison: None.  Findings: Mild dextroconvex lumbar scoliosis noted, possibly positional.  Vascular calcifications present in the pelvis, and a 7 mm calcification projecting over the left L3 transverse process is also probably vascular.  Prominence of stool throughout the colon suggests constipation. Lumbar vertebral alignment appears normal.  Mild spurring noted along the anterior superior endplate of L4, with slight linear lucency below the spur which is probably incidental given the lack of endplate can cavity or loss of vertebral height.  No subluxation observed.  IMPRESSION:  1.  No acute lumbar spine findings 1.  Prominence of stool throughout the colon suggests constipation. 2.  No fracture observed. 3.  Minimal spurring along the anterior superior endplate of L4.   Original Report Authenticated By: Gaylyn Rong, M.D.    Dg Shoulder Left  07/04/2012  *RADIOLOGY REPORT*  Clinical Data: Fall.  Left shoulder pain.  LEFT SHOULDER - 2+ VIEW  Comparison: None.  Findings: The patient is unable to fully externally rotate the shoulder.  No fracture observed. No dislocation.  There is a sclerotic lesion in the proximal metadiaphysis measuring about 2.0 x 1.3 cm, slightly indistinct zone of transition.  IMPRESSION:  1.  No  acute bony findings. 2.  Sclerotic lesion in the proximal humeral metadiaphysis. Although statistically most likely to be a fibrous cortical defect which is ossified, or a sclerotic enchondroma or bone island, a sclerotic osseous metastatic lesion could have a similar appearance. Correlate with any history of malignancy with predilection for bony spread, and consider follow-up radiography, MRI, or bone scan for further characterization.   Original Report Authenticated By: Gaylyn Rong, M.D.      1. Fall       MDM          Benny Lennert, MD 07/04/12 [phone]

## 2012-09-01 ENCOUNTER — Other Ambulatory Visit: Payer: Self-pay

## 2012-09-02 ENCOUNTER — Other Ambulatory Visit (INDEPENDENT_AMBULATORY_CARE_PROVIDER_SITE_OTHER): Payer: BC Managed Care – PPO

## 2012-09-02 DIAGNOSIS — Z0000 Encounter for general adult medical examination without abnormal findings: Secondary | ICD-10-CM

## 2012-09-02 LAB — POCT URINALYSIS DIPSTICK
Glucose, UA: NEGATIVE
Ketones, UA: NEGATIVE
Spec Grav, UA: 1.02

## 2012-09-02 LAB — TSH: TSH: 0.96 u[IU]/mL (ref 0.35–5.50)

## 2012-09-02 LAB — LIPID PANEL
Cholesterol: 166 mg/dL (ref 0–200)
LDL Cholesterol: 107 mg/dL — ABNORMAL HIGH (ref 0–99)
Triglycerides: 34 mg/dL (ref 0.0–149.0)
VLDL: 6.8 mg/dL (ref 0.0–40.0)

## 2012-09-02 LAB — BASIC METABOLIC PANEL
BUN: 12 mg/dL (ref 6–23)
Chloride: 100 mEq/L (ref 96–112)
Creatinine, Ser: 0.6 mg/dL (ref 0.4–1.2)
GFR: 136.61 mL/min (ref >60.00)
Potassium: 4.5 mEq/L (ref 3.5–5.1)

## 2012-09-02 LAB — CBC WITH DIFFERENTIAL/PLATELET
Basophils Absolute: 0.1 10*3/uL (ref 0.0–0.1)
Basophils Relative: 1.4 % (ref 0.0–3.0)
Eosinophils Absolute: 0.1 10*3/uL (ref 0.0–0.7)
Lymphocytes Relative: 43 % (ref 12.0–46.0)
MCHC: 32.9 g/dL (ref 30.0–36.0)
MCV: 95 fl (ref 78.0–100.0)
Monocytes Absolute: 0.6 10*3/uL (ref 0.1–1.0)
Neutrophils Relative %: 44.4 % (ref 43.0–77.0)
RBC: 4.07 Mil/uL (ref 3.87–5.11)
RDW: 14 % (ref 11.5–14.6)

## 2012-09-02 LAB — HEPATIC FUNCTION PANEL
ALT: 17 U/L (ref 0–35)
AST: 17 U/L (ref 0–37)
Alkaline Phosphatase: 52 U/L (ref 39–117)
Total Bilirubin: 0.6 mg/dL (ref 0.3–1.2)

## 2012-09-08 ENCOUNTER — Encounter: Payer: Self-pay | Admitting: Internal Medicine

## 2012-09-17 ENCOUNTER — Encounter: Payer: Self-pay | Admitting: Internal Medicine

## 2012-10-20 ENCOUNTER — Ambulatory Visit (INDEPENDENT_AMBULATORY_CARE_PROVIDER_SITE_OTHER): Payer: BC Managed Care – PPO | Admitting: Internal Medicine

## 2012-10-20 ENCOUNTER — Encounter: Payer: Self-pay | Admitting: Internal Medicine

## 2012-10-20 VITALS — BP 102/70 | HR 76 | Temp 98.6°F | Ht 61.5 in | Wt 190.0 lb

## 2012-10-20 DIAGNOSIS — R7309 Other abnormal glucose: Secondary | ICD-10-CM

## 2012-10-20 DIAGNOSIS — R7303 Prediabetes: Secondary | ICD-10-CM

## 2012-10-20 DIAGNOSIS — E669 Obesity, unspecified: Secondary | ICD-10-CM

## 2012-10-20 DIAGNOSIS — Z0000 Encounter for general adult medical examination without abnormal findings: Secondary | ICD-10-CM | POA: Insufficient documentation

## 2012-10-20 NOTE — Progress Notes (Signed)
Subjective:    Patient ID: Beverly Townsend, female    DOB: 30-Oct-1970, 42 y.o.   MRN: [id number]  HPI  42 year old African American female with history of obesity and prediabetes for routine physical. Patient was seen in the emergency room 2-3 months ago after suffering fall. X-rays of her left shoulder were obtained. There was question of bone island of left shoulder. Patient completely asymptomatic. She denies any pain or discomfort.  Patient continues to struggle with her weight. She has gained 5-10 pounds over last 6 months.  Review of Systems  Constitutional: Negative for activity change, appetite change and unexpected weight change.  Eyes: Negative for visual disturbance.  Respiratory: Negative for cough, chest tightness and shortness of breath.   Cardiovascular: Negative for chest pain.  Genitourinary: Negative for difficulty urinating.  Neurological: Negative for headaches.  Gastrointestinal: Negative for abdominal pain, heartburn melena or hematochezia Psych: Negative for depression or anxiety Endo:  No polyuria or polydypsia        Past Medical History  Diagnosis Date  . Obesity   . GERD (gastroesophageal reflux disease)   . Atypical chest pain     secondary to GERD  . Head ache     History   Social History  . Marital Status: Married    Spouse Name: N/A    Number of Children: 1  . Years of Education: N/A   Occupational History  . CNA    Social History Main Topics  . Smoking status: Never Smoker   . Smokeless tobacco: Never Used  . Alcohol Use: No  . Drug Use: Not on file  . Sexually Active: Not on file   Other Topics Concern  . Not on file   Social History Narrative  . No narrative on file    Past Surgical History  Procedure Laterality Date  . Abdominal hysterectomy      with BSO    Family History  Problem Relation Age of Onset  . Cancer Mother     breast  . Hypertension Mother   . Cancer Father     colon in early 6's  . Stroke  Paternal Grandmother   . Diabetes      No Known Allergies  Current Outpatient Prescriptions on File Prior to Visit  Medication Sig Dispense Refill  . estrogens, conjugated, (PREMARIN) 0.9 MG tablet Take 1 tablet (0.9 mg total) by mouth daily. Take daily for 21 days then do not take for 7 days.  30 tablet  6   No current facility-administered medications on file prior to visit.    BP 102/70  Pulse 76  Temp(Src) 98.6 F (37 C) (Oral)  Ht 5' 1.5" (1.562 m)  Wt 190 lb (86.183 kg)  BMI 35.32 kg/m2    Objective:   Physical Exam  Constitutional: She is oriented to person, place, and time. She appears well-developed and well-nourished.  HENT:  Head: Normocephalic and atraumatic.  Right Ear: External ear normal.  Left Ear: External ear normal.  Mouth/Throat: Oropharynx is clear and moist.  Neck: Normal range of motion. Neck supple.  Cardiovascular: Normal rate, regular rhythm and normal heart sounds.   Pulmonary/Chest: Effort normal.  Abdominal: Soft. Bowel sounds are normal. She exhibits no mass. There is no tenderness.  Musculoskeletal: She exhibits no edema.  Lymphadenopathy:    She has no cervical adenopathy.  Neurological: She is alert and oriented to person, place, and time. No cranial nerve deficit.  Skin: Skin is warm and dry.  Psychiatric: She  has a normal mood and affect. Her behavior is normal.          Assessment & Plan:

## 2012-10-20 NOTE — Assessment & Plan Note (Addendum)
Reviewed adult health maintenance protocols.  Patient experiencing difficulty with weight loss efforts. Patient unable to continue going to [employer] to scheduling conflicts. Refer to dietician for meal planning/Nutritional counseling.  She has history of prediabetes. Monitor A1c. If A1c greater than 6, we discussed restarting metformin. Patient up-to-date with adult vaccines. Patient advised to followup with her gynecologist.  He follows  yearly mammogram (fam hx of breast cancer).  She also has history of colon cancer.  She completed colonoscopy in the past. Patient agrees to contact her gastroenterologist to inquire about next colonoscopy.

## 2012-10-20 NOTE — Patient Instructions (Addendum)
Follow up with your GYN for yearly mammogram.  Please forward a copy your results to our office. Call your GI doctor re: date for your next colonoscopy Our office will contact you referral to nutritionist

## 2012-10-21 ENCOUNTER — Telehealth (HOSPITAL_COMMUNITY): Payer: Self-pay | Admitting: Dietician

## 2012-10-21 NOTE — Telephone Encounter (Signed)
Called back and left message at 1620. Offered appointment for 11/17/12 at 1000.

## 2012-10-21 NOTE — Telephone Encounter (Signed)
Received voicemail from Lilly at [facility] at 1555, requesting appointment for pt. Dx: obesity, prediabetes.

## 2012-10-22 NOTE — Telephone Encounter (Signed)
Received voicemail back from [place] at [phone]. Appt for 11/17/12 at 1000 has been confirmed.

## 2012-11-17 ENCOUNTER — Encounter (HOSPITAL_COMMUNITY): Payer: Self-pay | Admitting: Dietician

## 2012-11-17 NOTE — Progress Notes (Signed)
Outpatient Initial Nutrition Assessment  Date:11/17/2012   Appt Start Time: 0956  Referring Physician: [facility] (Dr. Artist Pais) Reason for Visit: prediabetes, obesity  Nutrition Assessment:  Height: 5' 1.5" (156.2 cm)   Weight: 188 lb (85.276 kg)   IBW: 108# %IBW: 174% UBW: 160# %UBW: 118%  Body mass index is 34.95 kg/(m^2). Meets criteria for obesity, class I.  Goal Weight: 140# (pt desired wt); 169# (10% wt loss of current wt) Weight hx: Pt reports current weight as highest adult weight (188#). She reports 160# as her "ususal" weight, but has not weight that for "several years". She desires to be between 130-140#; the last time she achieved that weight was in her late teens- early 20's,   Estimated nutritional needs: 1400-1500 kcals daily, 69-86 grams protein daily, 1.4-1.5 L fluid daily  PMH:  Past Medical History  Diagnosis Date  . Obesity   . GERD (gastroesophageal reflux disease)   . Atypical chest pain     secondary to GERD  . Head ache     Medications:  Current Outpatient Rx  Name  Route  Sig  Dispense  Refill  . estrogens, conjugated, (PREMARIN) 0.9 MG tablet   Oral   Take 1 tablet (0.9 mg total) by mouth daily. Take daily for 21 days then do not take for 7 days.   30 tablet   6     Labs: CMP     Component Value Date/Time   NA 135 09/02/2012 0955   K 4.5 09/02/2012 0955   CL 100 09/02/2012 0955   CO2 26 09/02/2012 0955   GLUCOSE 87 09/02/2012 0955   BUN 12 09/02/2012 0955   CREATININE 0.6 09/02/2012 0955   CREATININE 0.60 04/02/2011 1054   CALCIUM 9.2 09/02/2012 0955   PROT 7.6 09/02/2012 0955   ALBUMIN 3.9 09/02/2012 0955   AST 17 09/02/2012 0955   ALT 17 09/02/2012 0955   ALKPHOS 52 09/02/2012 0955   BILITOT 0.6 09/02/2012 0955   GFRNONAA 121 06/16/2007 0914   GFRAA 146 06/16/2007 0914    Lipid Panel     Component Value Date/Time   CHOL 166 09/02/2012 0955   TRIG 34.0 09/02/2012 0955   HDL 51.80 09/02/2012 0955   CHOLHDL 3 09/02/2012 0955    VLDL 6.8 09/02/2012 0955   LDLCALC 107* 09/02/2012 0955     Lab Results  Component Value Date   HGBA1C 6.3 10/20/2012   HGBA1C 6.2* 04/02/2011   HGBA1C 6.2* 02/27/2010   Lab Results  Component Value Date   LDLCALC 107* 09/02/2012   CREATININE 0.6 09/02/2012     Lifestyle/ social habits: Ms. Attwood is a very pleasant woman who resides in [place] with her husband and 12 year old son. 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 She reports her stress level is 4/10, citing she enjoys her job and has a very supportive family. She does not currently exercise. She reports that she has a [employer] and has done kettlebell classes in the distant past. She also has access to a treadmill and elliptical at home. She is contemplating walking at a nearby trail after she drops her son off at school.   Nutrition hx/habits: Ms. Diegel reports no significant changes to her diet recently. She is trying to cut back on sodas, reporting that "Cheerwine is my weakness". She has eliminated fried foods from her diet, and now bakes or grills all meats. She is now bringing salads to  work instead of fast food or donuts. She proudly reports that she has been using a food scale and measuring cups to better portion out her food. She has started a food diary using a paper form that she printed off the internet and she enjoys doing this.  She reports that she tried Weight Watchers about 4 years ago, but did not stick with it because she was not motivated at the time. She also stated "going to those meetings and keeping up with those points was not for me". She was able to lose "a few pounds" on this program. She has not tried any other weight loss programs.  She also eats out frequently; multiple times on weekends and at least 1-2 times during the week. Restaurants consists mostly of burger and seafood restaurants. When food is prepared at home (responsibilities shared with husband), seasonings  consist of salt, pepper, and no salt seasonings. She uses Smart Balance spread. Beverages consist mostly of water, and occasionally juice. She has cut back on soda intake to 2 12 oz cans per week.  She reveals to me that she wants to prevent diabetes diagnosis, but is concerned due to family history.   Diet recall: Breakfast: [place] biscuit OR cereal (Frosted flakes or rice krispies) with 2% milk; Lunch: grilled chicken OR turkwy burger, broccoli, brown rice, water; Dinner: salad with spinach, mushrooms, cucumbers, tomato, walnuts, sunflower seeds, boiled egg, bacon, and raspberry vinaigrette dressing, water OR juice.   Nutrition Diagnosis: Involuntary weight gain r/t excessive energy intake, physical inactivity AEB diet recall, Hgb A1c: 6.2.  Nutrition Intervention: Nutrition rx: 1200 kcal NAS, diabetic diet; 3 meals per day (45-60 grams carbohydrate per meal); no snacks; low calorie beverages only; 30 minutes physical activity daily  Education/Counseling Provided: Educated pt on principles of diabetic diet and weight management. Discussed carbohydrate metabolism in relation to diabetes and principles of energy expenditure and how changes in diet and physical activity affect weight status. Discussed Hgb A1c results, what Hgb A1c measures, and increased risk of developing diabetes given Hgb A1c and lifestyle factors. Educated pt on plate method, portion sizes, and sources of carbohydrate. Discussed importance of regular meal pattern. Discussed importance of adding sources of whole grains to diet to improve glycemic control. Also encouraged to choose low fat dairy, lean meats, and whole fruits and vegetables more often. Discussed options of artificial sweeteners and encouraged pt to use which brand she liked best. Discussed nutritional content of foods commonly eaten and discussed healthier alternatives. Discussed importance of compliance to prevent further complications of disease. Educated pt on  importance of physical activity (goal of at least 30 minutes 5 times per week) along with a healthy diet to achieve weight loss and glycemic goals. Encouraged slow, moderate weight loss of 1-2# per week, or 7-10% of current body weight, focusing on adopting healthy lifestyle changes vs. obtaining a certain body type or weight. Encouraged weighing self weekly at a consistent day and time of choice. Showed pt functionality of MyFitnessPal and encouraged using a food diary to better track caloric intake.  Provided "10 Simple Steps" and "Carb Counting and Meal Planning" handouts. Also provided AND Nutrition Care manual's "Weight Loss Tips" handout. Used TeachBack to assess understanding.   Understanding, Motivation, Ability to Follow Recommendations: Expect fair to good compliance.   Monitoring and Evaluation: Goals: 1) 1-2# wt loss per week; 2) 30 minutes physical activity daily; 3) Hgb A1c: 5.7  Recommendations: 1) For weight loss 1200-1300 kcals daily; 2) Make exercise enjoyable  and part of daily routine; 3) Consider water or sugar free drink mixes instead of soda; 4) Split entrees at restaurants or save half for a later meal  F/U: PRN. Pt does not desire follow-up at this time. Reports "I have what I need to work on this".   Melody Haver, RD, LDN 11/17/2012  Appt EndTime: 1125

## 2013-01-26 ENCOUNTER — Ambulatory Visit: Payer: Self-pay | Admitting: Family Medicine

## 2013-02-09 ENCOUNTER — Ambulatory Visit: Payer: BC Managed Care – PPO | Admitting: Internal Medicine

## 2013-02-09 DIAGNOSIS — Z0289 Encounter for other administrative examinations: Secondary | ICD-10-CM

## 2013-07-15 ENCOUNTER — Other Ambulatory Visit: Payer: Self-pay | Admitting: Gynecology

## 2013-07-15 DIAGNOSIS — R928 Other abnormal and inconclusive findings on diagnostic imaging of breast: Secondary | ICD-10-CM

## 2013-07-21 ENCOUNTER — Other Ambulatory Visit: Payer: Self-pay

## 2013-07-27 ENCOUNTER — Ambulatory Visit
Admission: RE | Admit: 2013-07-27 | Discharge: 2013-07-27 | Disposition: A | Discharge status: Home / Self Care | Payer: BC Managed Care – PPO | Source: Ambulatory Visit | Attending: Gynecology | Admitting: Gynecology

## 2013-07-27 ENCOUNTER — Other Ambulatory Visit: Payer: Self-pay | Admitting: Gynecology

## 2013-07-27 DIAGNOSIS — R928 Other abnormal and inconclusive findings on diagnostic imaging of breast: Secondary | ICD-10-CM

## 2013-08-06 ENCOUNTER — Ambulatory Visit
Admission: RE | Admit: 2013-08-06 | Discharge: 2013-08-06 | Disposition: A | Discharge status: Home / Self Care | Payer: PRIVATE HEALTH INSURANCE | Source: Ambulatory Visit | Attending: Gynecology | Admitting: Gynecology

## 2013-08-06 ENCOUNTER — Other Ambulatory Visit: Payer: Self-pay | Admitting: Gynecology

## 2013-08-06 ENCOUNTER — Ambulatory Visit
Admission: RE | Admit: 2013-08-06 | Discharge: 2013-08-06 | Disposition: A | Discharge status: Home / Self Care | Payer: BC Managed Care – PPO | Source: Ambulatory Visit | Attending: Gynecology | Admitting: Gynecology

## 2013-08-06 DIAGNOSIS — R928 Other abnormal and inconclusive findings on diagnostic imaging of breast: Secondary | ICD-10-CM

## 2013-08-06 DIAGNOSIS — N6002 Solitary cyst of left breast: Secondary | ICD-10-CM

## 2014-05-21 HISTORY — PX: COLONOSCOPY: SHX174

## 2014-06-24 ENCOUNTER — Emergency Department (HOSPITAL_COMMUNITY): Payer: PRIVATE HEALTH INSURANCE

## 2014-06-24 ENCOUNTER — Emergency Department (HOSPITAL_COMMUNITY)
Admission: EM | Admit: 2014-06-24 | Discharge: 2014-06-24 | Disposition: A | Discharge status: Home / Self Care | Payer: PRIVATE HEALTH INSURANCE | Attending: Emergency Medicine | Admitting: Emergency Medicine

## 2014-06-24 ENCOUNTER — Encounter (HOSPITAL_COMMUNITY): Payer: Self-pay | Admitting: Emergency Medicine

## 2014-06-24 DIAGNOSIS — S63501A Unspecified sprain of right wrist, initial encounter: Secondary | ICD-10-CM | POA: Insufficient documentation

## 2014-06-24 DIAGNOSIS — X58XXXA Exposure to other specified factors, initial encounter: Secondary | ICD-10-CM | POA: Diagnosis not present

## 2014-06-24 DIAGNOSIS — E669 Obesity, unspecified: Secondary | ICD-10-CM | POA: Insufficient documentation

## 2014-06-24 DIAGNOSIS — Y990 Civilian activity done for income or pay: Secondary | ICD-10-CM | POA: Insufficient documentation

## 2014-06-24 DIAGNOSIS — S6391XA Sprain of unspecified part of right wrist and hand, initial encounter: Secondary | ICD-10-CM | POA: Diagnosis not present

## 2014-06-24 DIAGNOSIS — Y9389 Activity, other specified: Secondary | ICD-10-CM | POA: Diagnosis not present

## 2014-06-24 DIAGNOSIS — Y92531 Health care provider office as the place of occurrence of the external cause: Secondary | ICD-10-CM | POA: Diagnosis not present

## 2014-06-24 DIAGNOSIS — S6991XA Unspecified injury of right wrist, hand and finger(s), initial encounter: Secondary | ICD-10-CM | POA: Diagnosis present

## 2014-06-24 DIAGNOSIS — Z8719 Personal history of other diseases of the digestive system: Secondary | ICD-10-CM | POA: Insufficient documentation

## 2014-06-24 MED ORDER — IBUPROFEN 800 MG PO TABS
800.0000 mg | ORAL_TABLET | Freq: Once | ORAL | Status: AC
  Administered 2014-06-24: 800 mg via ORAL
  Filled 2014-06-24 (×2): qty 1

## 2014-06-24 NOTE — ED Notes (Signed)
EDP at bedside  

## 2014-06-24 NOTE — ED Provider Notes (Signed)
CSN: [id number]     Arrival date & time 06/24/14  0149 History   First MD Initiated Contact with Patient 06/24/14 0201     Chief Complaint  Patient presents with  . Hand Pain    Patient is a 44 y.o. female presenting with hand pain. The history is provided by the patient.  Hand Pain This is a new problem. The current episode started 1 to 2 hours ago. The problem occurs constantly. The problem has not changed since onset.Pertinent negatives include no chest pain and no abdominal pain. Exacerbated by: movement. The symptoms are relieved by rest.  Patient reports she was at work moving a patient (she works at [employer]) The patient rolled back on her right hand, causing her to hyper-extend her fingers/wrist She reports she heard a "pop" Since that time she has had pain/swelling in right hand/wrist   Past Medical History  Diagnosis Date  . Obesity   . GERD (gastroesophageal reflux disease)   . Atypical chest pain     secondary to GERD  . Head ache    Past Surgical History  Procedure Laterality Date  . Abdominal hysterectomy      with BSO   Family History  Problem Relation Age of Onset  . Cancer Mother     breast  . Hypertension Mother   . Cancer Father     colon in early 16's  . Stroke Paternal Grandmother   . Diabetes     History  Substance Use Topics  . Smoking status: Never Smoker   . Smokeless tobacco: Never Used  . Alcohol Use: No   OB History    No data available     Review of Systems  Cardiovascular: Negative for chest pain.  Gastrointestinal: Negative for abdominal pain.  Musculoskeletal: Positive for arthralgias.      Allergies  Review of patient's allergies indicates no known allergies.  Home Medications   Prior to Admission medications   Medication Sig Start Date End Date Taking? Authorizing Provider  estrogens, conjugated, (PREMARIN) 0.9 MG tablet Take 1 tablet (0.9 mg total) by mouth daily. Take daily for 21 days then do not take for 7 days.  04/02/11   Burnice Logan, MD   BP 106/82 mmHg  Pulse 80  Temp(Src) 98.2 F (36.8 C) (Oral)  Resp 15  Ht 5\' 1"  (1.549 m)  Wt 170 lb (77.111 kg)  BMI 32.14 kg/m2  SpO2 97% Physical Exam CONSTITUTIONAL: Well developed/well nourished HEAD: Normocephalic/atraumatic ENMT: Mucous membranes moist NECK: supple no meningeal signs CV: S1/S2 noted, no murmurs/rubs/gallops noted LUNGS: Lungs are clear to auscultation bilaterally, no apparent distress ABDOMEN: soft, nontender, no rebound or guarding, bowel sounds noted throughout abdomens NEURO: Pt is awake/alert/appropriate, moves all extremitiesx4.  No facial droop.   EXTREMITIES: pulses normal/equal, full ROM. Tenderness noted to right wrist/hand.  No deformity.  She can make a fist and fully extend fingers on right hand.  She can flex/extend right wrist Distal radial/media/ulnar motor/sensory intact on right hand.   SKIN: warm, color normal PSYCH: no abnormalities of mood noted, alert and oriented to situation  ED Course  Procedures   SPLINT APPLICATION Date/Time: [id number] Authorized by: Sharyon Cable Consent: Verbal consent obtained. Consent given by: patient Splint applied by: nurse Location details: right wrist Splint type: removable wrist splint  Post-procedure: The splinted body part was neurovascularly unchanged following the procedure. Patient tolerance: Patient tolerated the procedure well with no immediate complications.     Imaging Review Dg Wrist  Complete Right  06/24/2014   CLINICAL DATA:  Right hand and wrist pain after injury. Generalized wrist pain.  EXAM: RIGHT WRIST - COMPLETE 3+ VIEW  COMPARISON:  None.  FINDINGS: No fracture or dislocation. The alignment and joint spaces are maintained. The scaphoid is normal. There are no osseous erosions. No focal soft tissue abnormality.  IMPRESSION: Unremarkable radiographs of the right wrist. No fracture or dislocation.   Electronically Signed   By: Jeb Levering M.D.    On: 06/24/2014 02:35   Dg Hand Complete Right  06/24/2014   CLINICAL DATA:  Right hand and wrist pain after injury. Generalized hand pain.  EXAM: RIGHT HAND - COMPLETE 3+ VIEW  COMPARISON:  None.  FINDINGS: No fracture or dislocation. The alignment and joint spaces are maintained. No erosion or periosteal reaction. No focal soft tissue abnormality.  IMPRESSION: Unremarkable radiographs of the right hand. No fracture or dislocation.   Electronically Signed   By: Jeb Levering M.D.   On: 06/24/2014 02:34     Medications  ibuprofen (ADVIL,MOTRIN) tablet 800 mg (800 mg Oral Given 06/24/14 0208)   2:51 AM Due to nature of injury at work advised close ortho/occupational health evaluation and advised to talk to her supervisor tonight about this Advised Ibuprofen for pain  MDM   Final diagnoses:  Hand sprain, right, initial encounter  Right wrist sprain, initial encounter    Nursing notes including past medical history and social history reviewed and considered in documentation xrays/imaging reviewed by myself and considered during evaluation     Sharyon Cable, MD 06/24/14 [phone]

## 2014-06-24 NOTE — ED Notes (Signed)
Pt c/o rt hand and wrist pain after work injury.

## 2014-07-06 ENCOUNTER — Other Ambulatory Visit: Payer: Self-pay

## 2014-07-06 DIAGNOSIS — Z1231 Encounter for screening mammogram for malignant neoplasm of breast: Secondary | ICD-10-CM

## 2014-07-14 ENCOUNTER — Ambulatory Visit: Payer: Self-pay

## 2014-07-26 ENCOUNTER — Ambulatory Visit
Admission: RE | Admit: 2014-07-26 | Discharge: 2014-07-26 | Disposition: A | Discharge status: Home / Self Care | Payer: BLUE CROSS/BLUE SHIELD | Source: Ambulatory Visit

## 2014-07-26 DIAGNOSIS — Z1231 Encounter for screening mammogram for malignant neoplasm of breast: Secondary | ICD-10-CM

## 2015-02-01 ENCOUNTER — Encounter: Payer: Self-pay | Admitting: Gastroenterology

## 2015-04-27 ENCOUNTER — Encounter: Payer: Self-pay | Admitting: Adult Health

## 2015-04-27 ENCOUNTER — Ambulatory Visit (INDEPENDENT_AMBULATORY_CARE_PROVIDER_SITE_OTHER): Payer: BLUE CROSS/BLUE SHIELD | Admitting: Adult Health

## 2015-04-27 VITALS — BP 102/70 | Temp 98.1°F | Ht 61.0 in | Wt 194.3 lb

## 2015-04-27 DIAGNOSIS — Z0000 Encounter for general adult medical examination without abnormal findings: Secondary | ICD-10-CM

## 2015-04-27 DIAGNOSIS — G43009 Migraine without aura, not intractable, without status migrainosus: Secondary | ICD-10-CM | POA: Diagnosis not present

## 2015-04-27 DIAGNOSIS — R079 Chest pain, unspecified: Secondary | ICD-10-CM

## 2015-04-27 LAB — CBC WITH DIFFERENTIAL/PLATELET
BASOS ABS: 0.1 10*3/uL (ref 0.0–0.1)
Basophils Relative: 0.9 % (ref 0.0–3.0)
Eosinophils Absolute: 0.2 10*3/uL (ref 0.0–0.7)
Eosinophils Relative: 2.2 % (ref 0.0–5.0)
HEMATOCRIT: 38 % (ref 36.0–46.0)
Hemoglobin: 12.4 g/dL (ref 12.0–15.0)
Lymphocytes Relative: 41.4 % (ref 12.0–46.0)
Lymphs Abs: 3 10*3/uL (ref 0.7–4.0)
MCHC: 32.6 g/dL (ref 30.0–36.0)
MCV: 94.6 fl (ref 78.0–100.0)
Monocytes Absolute: 0.6 10*3/uL (ref 0.1–1.0)
Monocytes Relative: 7.8 % (ref 3.0–12.0)
Neutro Abs: 3.4 10*3/uL (ref 1.4–7.7)
Neutrophils Relative %: 47.7 % (ref 43.0–77.0)
Platelets: 280 10*3/uL (ref 150.0–400.0)
RBC: 4.03 Mil/uL (ref 3.87–5.11)
RDW: 14.3 % (ref 11.5–15.5)
WBC: 7.2 10*3/uL (ref 4.0–10.5)

## 2015-04-27 LAB — POCT URINALYSIS DIPSTICK
BILIRUBIN UA: NEGATIVE
GLUCOSE UA: NEGATIVE
KETONES UA: NEGATIVE
Leukocytes, UA: NEGATIVE
Nitrite, UA: NEGATIVE
Protein, UA: NEGATIVE
RBC UA: NEGATIVE
UROBILINOGEN UA: 0.2
pH, UA: 5.5

## 2015-04-27 LAB — LIPID PANEL
Cholesterol: 172 mg/dL (ref 0–200)
HDL: 53.3 mg/dL (ref >39.00)
LDL CALC: 108 mg/dL — AB (ref 0–99)
NonHDL: 118.67
TRIGLYCERIDES: 54 mg/dL (ref 0.0–149.0)
Total CHOL/HDL Ratio: 3
VLDL: 10.8 mg/dL (ref 0.0–40.0)

## 2015-04-27 LAB — BASIC METABOLIC PANEL
BUN: 14 mg/dL (ref 6–23)
CALCIUM: 9.6 mg/dL (ref 8.4–10.5)
CO2: 29 mEq/L (ref 19–32)
Chloride: 105 mEq/L (ref 96–112)
Creatinine, Ser: 0.62 mg/dL (ref 0.40–1.20)
GFR: 134.87 mL/min (ref >60.00)
Glucose, Bld: 99 mg/dL (ref 70–99)
Potassium: 4.7 mEq/L (ref 3.5–5.1)
Sodium: 142 mEq/L (ref 135–145)

## 2015-04-27 LAB — HEPATIC FUNCTION PANEL
ALT: 14 U/L (ref 0–35)
AST: 17 U/L (ref 0–37)
Albumin: 4.1 g/dL (ref 3.5–5.2)
Alkaline Phosphatase: 65 U/L (ref 39–117)
BILIRUBIN DIRECT: 0.1 mg/dL (ref 0.0–0.3)
Total Bilirubin: 0.3 mg/dL (ref 0.2–1.2)
Total Protein: 7.1 g/dL (ref 6.0–8.3)

## 2015-04-27 LAB — HEMOGLOBIN A1C: Hgb A1c MFr Bld: 6.3 % (ref 4.6–6.5)

## 2015-04-27 LAB — TSH: TSH: 1.54 u[IU]/mL (ref 0.35–4.50)

## 2015-04-27 MED ORDER — SUMATRIPTAN SUCCINATE 50 MG PO TABS: 50.0000 mg | ORAL_TABLET | ORAL | Status: DC | PRN

## 2015-04-27 NOTE — Patient Instructions (Addendum)
It was great meeting you today!  I will follow up with you regarding your labs.   Continue to work on diet and exercise.   I have sent in a prescription for Imitrex for your headaches. Take as directed.   Melatonin 5 mg for sleep   Omeprazole for acid reflux.   Follow up in one year or sooner if needed.   Health Maintenance, Female Adopting a healthy lifestyle and getting preventive care can go a long way to promote health and wellness. Talk with your health care provider about what schedule of regular examinations is right for you. This is a good chance for you to check in with your provider about disease prevention and staying healthy. In between checkups, there are plenty of things you can do on your own. Experts have done a lot of research about which lifestyle changes and preventive measures are most likely to keep you healthy. Ask your health care provider for more information. WEIGHT AND DIET  Eat a healthy diet  Be sure to include plenty of vegetables, fruits, low-fat dairy products, and lean protein.  Do not eat a lot of foods high in solid fats, added sugars, or salt.  Get regular exercise. This is one of the most important things you can do for your health.  Most adults should exercise for at least 150 minutes each week. The exercise should increase your heart rate and make you sweat (moderate-intensity exercise).  Most adults should also do strengthening exercises at least twice a week. This is in addition to the moderate-intensity exercise.  Maintain a healthy weight  Body mass index (BMI) is a measurement that can be used to identify possible weight problems. It estimates body fat based on height and weight. Your health care provider can help determine your BMI and help you achieve or maintain a healthy weight.  For females 92 years of age and older:   A BMI below 18.5 is considered underweight.  A BMI of 18.5 to 24.9 is normal.  A BMI of 25 to 29.9 is  considered overweight.  A BMI of 30 and above is considered obese.  Watch levels of cholesterol and blood lipids  You should start having your blood tested for lipids and cholesterol at 43 years of age, then have this test every 5 years.  You may need to have your cholesterol levels checked more often if:  Your lipid or cholesterol levels are high.  You are older than 43 years of age.  You are at high risk for heart disease.  CANCER SCREENING   Lung Cancer  Lung cancer screening is recommended for adults 6-75 years old who are at high risk for lung cancer because of a history of smoking.  A yearly low-dose CT scan of the lungs is recommended for people who:  Currently smoke.  Have quit within the past 15 years.  Have at least a 30-pack-year history of smoking. A pack year is smoking an average of one pack of cigarettes a day for 1 year.  Yearly screening should continue until it has been 15 years since you quit.  Yearly screening should stop if you develop a health problem that would prevent you from having lung cancer treatment.  Breast Cancer  Practice breast self-awareness. This means understanding how your breasts normally appear and feel.  It also means doing regular breast self-exams. Let your health care provider know about any changes, no matter how small.  If you are in your 33s or  65s, you should have a clinical breast exam (CBE) by a health care provider every 1-3 years as part of a regular health exam.  If you are 68 or older, have a CBE every year. Also consider having a breast X-ray (mammogram) every year.  If you have a family history of breast cancer, talk to your health care provider about genetic screening.  If you are at high risk for breast cancer, talk to your health care provider about having an MRI and a mammogram every year.  Breast cancer gene (BRCA) assessment is recommended for women who have family members with BRCA-related cancers.  BRCA-related cancers include:  Breast.  Ovarian.  Tubal.  Peritoneal cancers.  Results of the assessment will determine the need for genetic counseling and BRCA1 and BRCA2 testing. Cervical Cancer Your health care provider may recommend that you be screened regularly for cancer of the pelvic organs (ovaries, uterus, and vagina). This screening involves a pelvic examination, including checking for microscopic changes to the surface of your cervix (Pap test). You may be encouraged to have this screening done every 3 years, beginning at age 15.  For women ages 29-65, health care providers may recommend pelvic exams and Pap testing every 3 years, or they may recommend the Pap and pelvic exam, combined with testing for human papilloma virus (HPV), every 5 years. Some types of HPV increase your risk of cervical cancer. Testing for HPV may also be done on women of any age with unclear Pap test results.  Other health care providers may not recommend any screening for nonpregnant women who are considered low risk for pelvic cancer and who do not have symptoms. Ask your health care provider if a screening pelvic exam is right for you.  If you have had past treatment for cervical cancer or a condition that could lead to cancer, you need Pap tests and screening for cancer for at least 20 years after your treatment. If Pap tests have been discontinued, your risk factors (such as having a new sexual partner) need to be reassessed to determine if screening should resume. Some women have medical problems that increase the chance of getting cervical cancer. In these cases, your health care provider may recommend more frequent screening and Pap tests. Colorectal Cancer  This type of cancer can be detected and often prevented.  Routine colorectal cancer screening usually begins at 43 years of age and continues through 43 years of age.  Your health care provider may recommend screening at an earlier age if you  have risk factors for colon cancer.  Your health care provider may also recommend using home test kits to check for hidden blood in the stool.  A small camera at the end of a tube can be used to examine your colon directly (sigmoidoscopy or colonoscopy). This is done to check for the earliest forms of colorectal cancer.  Routine screening usually begins at age 38.  Direct examination of the colon should be repeated every 5-10 years through 43 years of age. However, you may need to be screened more often if early forms of precancerous polyps or small growths are found. Skin Cancer  Check your skin from head to toe regularly.  Tell your health care provider about any new moles or changes in moles, especially if there is a change in a mole's shape or color.  Also tell your health care provider if you have a mole that is larger than the size of a pencil eraser.  Always  use sunscreen. Apply sunscreen liberally and repeatedly throughout the day.  Protect yourself by wearing long sleeves, pants, a wide-brimmed hat, and sunglasses whenever you are outside. HEART DISEASE, DIABETES, AND HIGH BLOOD PRESSURE   High blood pressure causes heart disease and increases the risk of stroke. High blood pressure is more likely to develop in:  People who have blood pressure in the high end of the normal range (130-139/85-89 mm Hg).  People who are overweight or obese.  People who are African American.  If you are 66-33 years of age, have your blood pressure checked every 3-5 years. If you are 83 years of age or older, have your blood pressure checked every year. You should have your blood pressure measured twice--once when you are at a hospital or clinic, and once when you are not at a hospital or clinic. Record the average of the two measurements. To check your blood pressure when you are not at a hospital or clinic, you can use:  An automated blood pressure machine at a pharmacy.  A home blood pressure  monitor.  If you are between 64 years and 56 years old, ask your health care provider if you should take aspirin to prevent strokes.  Have regular diabetes screenings. This involves taking a blood sample to check your fasting blood sugar level.  If you are at a normal weight and have a low risk for diabetes, have this test once every three years after 43 years of age.  If you are overweight and have a high risk for diabetes, consider being tested at a younger age or more often. PREVENTING INFECTION  Hepatitis B  If you have a higher risk for hepatitis B, you should be screened for this virus. You are considered at high risk for hepatitis B if:  You were born in a country where hepatitis B is common. Ask your health care provider which countries are considered high risk.  Your parents were born in a high-risk country, and you have not been immunized against hepatitis B (hepatitis B vaccine).  You have HIV or AIDS.  You use needles to inject street drugs.  You live with someone who has hepatitis B.  You have had sex with someone who has hepatitis B.  You get hemodialysis treatment.  You take certain medicines for conditions, including cancer, organ transplantation, and autoimmune conditions. Hepatitis C  Blood testing is recommended for:  Everyone born from [birth year] through 1964.  Anyone with known risk factors for hepatitis C. Sexually transmitted infections (STIs)  You should be screened for sexually transmitted infections (STIs) including gonorrhea and chlamydia if:  You are sexually active and are younger than 43 years of age.  You are older than 43 years of age and your health care provider tells you that you are at risk for this type of infection.  Your sexual activity has changed since you were last screened and you are at an increased risk for chlamydia or gonorrhea. Ask your health care provider if you are at risk.  If you do not have HIV, but are at risk, it may be  recommended that you take a prescription medicine daily to prevent HIV infection. This is called pre-exposure prophylaxis (PrEP). You are considered at risk if:  You are sexually active and do not regularly use condoms or know the HIV status of your partner(s).  You take drugs by injection.  You are sexually active with a partner who has HIV. Talk with your health care  provider about whether you are at high risk of being infected with HIV. If you choose to begin PrEP, you should first be tested for HIV. You should then be tested every 3 months for as long as you are taking PrEP.  PREGNANCY   If you are premenopausal and you may become pregnant, ask your health care provider about preconception counseling.  If you may become pregnant, take 400 to 800 micrograms (mcg) of folic acid every day.  If you want to prevent pregnancy, talk to your health care provider about birth control (contraception). OSTEOPOROSIS AND MENOPAUSE   Osteoporosis is a disease in which the bones lose minerals and strength with aging. This can result in serious bone fractures. Your risk for osteoporosis can be identified using a bone density scan.  If you are 11 years of age or older, or if you are at risk for osteoporosis and fractures, ask your health care provider if you should be screened.  Ask your health care provider whether you should take a calcium or vitamin D supplement to lower your risk for osteoporosis.  Menopause may have certain physical symptoms and risks.  Hormone replacement therapy may reduce some of these symptoms and risks. Talk to your health care provider about whether hormone replacement therapy is right for you.  HOME CARE INSTRUCTIONS   Schedule regular health, dental, and eye exams.  Stay current with your immunizations.   Do not use any tobacco products including cigarettes, chewing tobacco, or electronic cigarettes.  If you are pregnant, do not drink alcohol.  If you are  breastfeeding, limit how much and how often you drink alcohol.  Limit alcohol intake to no more than 1 drink per day for nonpregnant women. One drink equals 12 ounces of beer, 5 ounces of wine, or 1 ounces of hard liquor.  Do not use street drugs.  Do not share needles.  Ask your health care provider for help if you need support or information about quitting drugs.  Tell your health care provider if you often feel depressed.  Tell your health care provider if you have ever been abused or do not feel safe at home.   This information is not intended to replace advice given to you by your health care provider. Make sure you discuss any questions you have with your health care provider.   Document Released: 11/20/2010 Document Revised: 05/28/2014 Document Reviewed: 04/08/2013 Elsevier Interactive Patient Education [employer].

## 2015-04-27 NOTE — Progress Notes (Signed)
Pre visit review using our clinic review tool, if applicable. No additional management support is needed unless otherwise documented below in the visit note. 

## 2015-04-27 NOTE — Progress Notes (Signed)
HPI:  Beverly Townsend is here to establish care and have her complete physical. She is a pleasant AA female who  has a past medical history of Obesity; GERD (gastroesophageal reflux disease); Atypical chest pain; and Head ache.  Last PCP and physical: 08/2012 with Dr. Shawna Orleans  Has the following chronic problems that require follow up and concerns today:  Atypical Chest Pain She has left sided atypical chest pain- this is a chronic issue and is likely due to GERD.Marland Kitchen Has never seen cardiology or worn an event monitor.   Headache  - Gets headaches 3-4 times a month. This is a chronic issue for the patient and has been happening for years. She usually takes Excedrin migraine which does not always work. Has never tried Imitrex. She denies any blurred vision   ROS negative for unless reported above: fevers, chills,feeling poorly, unintentional weight loss, hearing or vision loss, chest pain, palpitations, leg claudication, struggling to breath,Not feeling congested in the chest, no orthopenia, no cough,no wheezing, normal appetite, no soft tissue swelling, no hemoptysis, melena, hematochezia, hematuria, falls, loc, si, or thoughts of self harm.  Immunizations:UTD Diet: Eats lean meats. Eats out on the weekends. Exercise: Walks on the tread mill and in the park. Does weight training. Twice a week.  Pap Smear: 2015 - Has GYN - no abnormal paps Mammogram: March 2016 - Normal  Colonoscopy: She has one scheduled for this month. Had one 10 years ago due to family history.   She does her routine eye and dental appointments.   Past Medical History  Diagnosis Date  . Obesity   . GERD (gastroesophageal reflux disease)   . Atypical chest pain     secondary to GERD  . Head ache     Past Surgical History  Procedure Laterality Date  . Abdominal hysterectomy      with BSO    Family History  Problem Relation Age of Onset  . Cancer Mother     breast  . Hypertension Mother   . Cancer Father     colon in early 35's  . Stroke Paternal Grandmother   . Diabetes      Social History   Social History  . Marital Status: Married    Spouse Name: N/A  . Number of Children: 1  . Years of Education: N/A   Occupational History  . CNA    Social History Main Topics  . Smoking status: Never Smoker   . Smokeless tobacco: Never Used  . Alcohol Use: No  . Drug Use: None  . Sexual Activity: Not Asked   Other Topics Concern  . None   Social History Narrative     Current outpatient prescriptions:  .  estrogens, conjugated, (PREMARIN) 0.9 MG tablet, Take 1 tablet (0.9 mg total) by mouth daily. Take daily for 21 days then do not take for 7 days., Disp: 30 tablet, Rfl: 6  EXAM:  Filed Vitals:   04/27/15 0838  BP: 102/70  Temp: 98.1 F (36.7 C)    Body mass index is 36.73 kg/(m^2).  GENERAL: vitals reviewed and listed above, alert, oriented, appears well hydrated and in no acute distress. Slightly obese.  HEENT: atraumatic, conjunttiva clear, no obvious abnormalities on inspection of external nose and ears  NECK: Neck is soft and supple without masses, no adenopathy or thyromegaly, trachea midline, no JVD. Normal range of motion.   LUNGS: clear to auscultation bilaterally, no wheezes, rales or rhonchi, good air movement  CV: Regular rate  and rhythm, normal S1/S2, no audible murmurs, gallops, or rubs. No carotid bruit and no peripheral edema.   MS: moves all extremities without noticeable abnormality. No edema noted  Abd: soft/nontender/nondistended/normal bowel sounds   Skin: warm and dry, no rash   Extremities: No clubbing, cyanosis, or edema. Capillary refill is WNL. Pulses intact bilaterally in upper and lower extremities.   Neuro: CN II-XII intact, sensation and reflexes normal throughout, 5/5 muscle strength in bilateral upper and lower extremities. Normal finger to nose. Normal rapid alternating movements. Normal romberg. No pronator drift.   PSYCH: pleasant  and cooperative, no obvious depression or anxiety  ASSESSMENT AND PLAN:  1. Chest pain, unspecified chest pain type  - EKG 12-Lead- NSR, rate 75  2. Routine general medical examination at a health care facility  - Basic metabolic panel - CBC with Differential/Platelet - Hemoglobin A1c - Hepatic function panel - Lipid panel - POCT urinalysis dipstick - TSH  3. Migraine without aura and without status migrainosus, not intractable - SUMAtriptan (IMITREX) 50 MG tablet; Take 1 tablet (50 mg total) by mouth every 2 (two) hours as needed for migraine. May repeat in 2 hours if headache persists or recurs.  Dispense: 10 tablet; Refill: 3    Discussed the following assessment and plan: -We reviewed the PMH, PSH, FH, SH, Meds and Allergies. -We provided refills for any medications we will prescribe as needed. -We addressed current concerns per orders and patient instructions. -We have asked for records for pertinent exams, studies, vaccines and notes from previous providers. -We have advised patient to follow up per instructions below.   -Patient advised to return or notify a provider immediately if symptoms worsen or persist or new concerns arise.    [employer]

## 2015-05-09 ENCOUNTER — Ambulatory Visit (AMBULATORY_SURGERY_CENTER): Payer: Self-pay

## 2015-05-09 VITALS — Ht 61.0 in | Wt 193.0 lb

## 2015-05-09 DIAGNOSIS — Z800 Family history of malignant neoplasm of digestive organs: Secondary | ICD-10-CM

## 2015-05-09 MED ORDER — NA SULFATE-K SULFATE-MG SULF 17.5-3.13-1.6 GM/177ML PO SOLN: 1.0000 | Freq: Once | ORAL | Status: DC

## 2015-05-09 NOTE — Progress Notes (Signed)
No egg or soy allergies Not on home 02 No previous anesthesia complications No diet or weight loss meds 

## 2015-05-18 ENCOUNTER — Ambulatory Visit (AMBULATORY_SURGERY_CENTER): Payer: BLUE CROSS/BLUE SHIELD | Admitting: Gastroenterology

## 2015-05-18 ENCOUNTER — Encounter: Payer: Self-pay | Admitting: Gastroenterology

## 2015-05-18 VITALS — BP 104/72 | HR 70 | Temp 96.2°F | Resp 19 | Ht 61.0 in | Wt 193.0 lb

## 2015-05-18 DIAGNOSIS — D125 Benign neoplasm of sigmoid colon: Secondary | ICD-10-CM | POA: Diagnosis not present

## 2015-05-18 DIAGNOSIS — Z800 Family history of malignant neoplasm of digestive organs: Secondary | ICD-10-CM | POA: Diagnosis present

## 2015-05-18 DIAGNOSIS — Z1211 Encounter for screening for malignant neoplasm of colon: Secondary | ICD-10-CM | POA: Diagnosis not present

## 2015-05-18 MED ORDER — SODIUM CHLORIDE 0.9 % IV SOLN: 500.0000 mL | INTRAVENOUS | Status: DC

## 2015-05-18 NOTE — Patient Instructions (Signed)
YOU HAD AN ENDOSCOPIC PROCEDURE TODAY AT THE [facility]:   Refer to the procedure report that was given to you for any specific questions about what was found during the examination.  If the procedure report does not answer your questions, please call your gastroenterologist to clarify.  If you requested that your care partner not be given the details of your procedure findings, then the procedure report has been included in a sealed envelope for you to review at your convenience later.  YOU SHOULD EXPECT: Some feelings of bloating in the abdomen. Passage of more gas than usual.  Walking can help get rid of the air that was put into your GI tract during the procedure and reduce the bloating. If you had a lower endoscopy (such as a colonoscopy or flexible sigmoidoscopy) you may notice spotting of blood in your stool or on the toilet paper. If you underwent a bowel prep for your procedure, you may not have a normal bowel movement for a few days.  Please Note:  You might notice some irritation and congestion in your nose or some drainage.  This is from the oxygen used during your procedure.  There is no need for concern and it should clear up in a day or so.  SYMPTOMS TO REPORT IMMEDIATELY:   Following lower endoscopy (colonoscopy or flexible sigmoidoscopy):  Excessive amounts of blood in the stool  Significant tenderness or worsening of abdominal pains  Swelling of the abdomen that is new, acute  Fever of 100F or higher   For urgent or emergent issues, a gastroenterologist can be reached at any hour by calling [phone].   DIET: Your first meal following the procedure should be a small meal and then it is ok to progress to your normal diet. Heavy or fried foods are harder to digest and may make you feel nauseous or bloated.  Likewise, meals heavy in dairy and vegetables can increase bloating.  Drink plenty of fluids but you should avoid alcoholic beverages for 24  hours.  ACTIVITY:  You should plan to take it easy for the rest of today and you should NOT DRIVE or use heavy machinery until tomorrow (because of the sedation medicines used during the test).    FOLLOW UP: Our staff will call the number listed on your records the next business day following your procedure to check on you and address any questions or concerns that you may have regarding the information given to you following your procedure. If we do not reach you, we will leave a message.  However, if you are feeling well and you are not experiencing any problems, there is no need to return our call.  We will assume that you have returned to your regular daily activities without incident.  If any biopsies were taken you will be contacted by phone or by letter within the next 1-3 weeks.  Please call us at [phone] if you have not heard about the biopsies in 3 weeks.    SIGNATURES/CONFIDENTIALITY: You and/or your care partner have signed paperwork which will be entered into your electronic medical record.  These signatures attest to the fact that that the information above on your After Visit Summary has been reviewed and is understood.  Full responsibility of the confidentiality of this discharge information lies with you and/or your care-partner.    Resume medications. Information given on polyps. 

## 2015-05-18 NOTE — Progress Notes (Signed)
To recovery, report to Brown, RN,  

## 2015-05-18 NOTE — Progress Notes (Signed)
Called to room to assist during endoscopic procedure.  Patient ID and intended procedure confirmed with present staff. Received instructions for my participation in the procedure from the performing physician.  

## 2015-05-18 NOTE — Op Note (Signed)
[place]  [address]. [place], [address]   COLONOSCOPY PROCEDURE REPORT  PATIENT: Beverly Townsend, Beverly Townsend  MR#: [id number] BIRTHDATE: 17-Nov-1970 , 44  yrs. old GENDER: female ENDOSCOPIST: Harl Bowie, MD REFERRED [id number] Shawna Orleans, DO PROCEDURE DATE:  05/18/2015 PROCEDURE:   Colonoscopy, screening and Colonoscopy with snare polypectomy First Screening Colonoscopy - Avg.  risk and is 50 yrs.  old or older - No.  Prior Negative Screening - Now for repeat screening. Less than 10 yrs Prior Negative Screening - Now for repeat screening.  Above average risk  History of Adenoma - Now for follow-up colonoscopy & has been > or = to 3 yrs.  N/A  Polyps removed today? Yes ASA CLASS:   Class II INDICATIONS:Screening for colonic neoplasia and FH Colon or Rectal Adenocarcinoma. MEDICATIONS: Propofol 250 mg IV  DESCRIPTION OF PROCEDURE:   After the risks benefits and alternatives of the procedure were thoroughly explained, informed consent was obtained.  The digital rectal exam revealed no abnormalities of the rectum.   The LB [id number]  endoscope was introduced through the anus and advanced to the terminal ileum which was intubated for a short distance. No adverse events experienced.   The quality of the prep was good.  Boston prep score 3-3-3(9)The instrument was then slowly withdrawn as the colon was fully examined. Estimated blood loss is zero unless otherwise noted in this procedure report.   COLON FINDINGS: A sessile polyp ranging between 3-67mm in size was found in the sigmoid colon.  A polypectomy was performed with a cold snare.  The resection was complete, the polyp tissue was completely retrieved and sent to histology.   The examination was otherwise normal.   The examined terminal ileum appeared to be normal.  Retroflexed views revealed internal Grade I hemorrhoids. The time to cecum = 4.9 Withdrawal time = 6.9   The scope was withdrawn and the  procedure completed. COMPLICATIONS: There were no immediate complications.  ENDOSCOPIC IMPRESSION: 1.   Sessile polyp ranging between 3-81mm in size was found in the sigmoid colon; polypectomy was performed with a cold snare 2.   The examination was otherwise normal 3.   The examined terminal ileum appeared to be normal  RECOMMENDATIONS: 1.  Await pathology results 2.  If the polyp(s) removed today are proven to be adenomatous (pre-cancerous) polyps or not, you will need a repeat colonoscopy in 5 years.    You will receive a letter within 1-2 weeks with the results of your biopsy as well as final recommendations.  Please call my office if you have not received a letter after 3 weeks.  eSigned:  Harl Bowie, MD 05/18/2015 9:54 AM

## 2015-05-19 ENCOUNTER — Telehealth: Payer: Self-pay | Admitting: *Deleted

## 2015-05-19 NOTE — Telephone Encounter (Signed)
  Follow up Call-  Call back number 05/18/2015  Post procedure Call Back phone  # [phone]  Permission to leave phone message Yes     Patient questions:  Do you have a fever, pain , or abdominal swelling? No. Pain Score  0 *  Have you tolerated food without any problems? Yes.    Have you been able to return to your normal activities? Yes.    Do you have any questions about your discharge instructions: Diet   No. Medications  No. Follow up visit  No.  Do you have questions or concerns about your Care? No.  Actions: * If pain score is 4 or above: No action needed, pain <4.

## 2015-05-26 ENCOUNTER — Encounter: Payer: Self-pay | Admitting: Gastroenterology

## 2015-06-23 ENCOUNTER — Other Ambulatory Visit: Payer: Self-pay

## 2015-06-23 DIAGNOSIS — Z1231 Encounter for screening mammogram for malignant neoplasm of breast: Secondary | ICD-10-CM

## 2015-07-27 ENCOUNTER — Ambulatory Visit
Admission: RE | Admit: 2015-07-27 | Discharge: 2015-07-27 | Disposition: A | Discharge status: Home / Self Care | Payer: 59 | Source: Ambulatory Visit

## 2015-07-27 DIAGNOSIS — Z1231 Encounter for screening mammogram for malignant neoplasm of breast: Secondary | ICD-10-CM

## 2015-11-12 ENCOUNTER — Emergency Department (HOSPITAL_COMMUNITY)
Admission: EM | Admit: 2015-11-12 | Discharge: 2015-11-12 | Disposition: A | Discharge status: Home / Self Care | Payer: 59 | Attending: Emergency Medicine | Admitting: Emergency Medicine

## 2015-11-12 ENCOUNTER — Emergency Department (HOSPITAL_COMMUNITY): Payer: 59

## 2015-11-12 ENCOUNTER — Encounter (HOSPITAL_COMMUNITY): Payer: Self-pay

## 2015-11-12 ENCOUNTER — Other Ambulatory Visit: Payer: Self-pay

## 2015-11-12 DIAGNOSIS — R079 Chest pain, unspecified: Secondary | ICD-10-CM | POA: Diagnosis not present

## 2015-11-12 DIAGNOSIS — R0789 Other chest pain: Secondary | ICD-10-CM | POA: Diagnosis not present

## 2015-11-12 DIAGNOSIS — G4700 Insomnia, unspecified: Secondary | ICD-10-CM | POA: Diagnosis not present

## 2015-11-12 LAB — BASIC METABOLIC PANEL
ANION GAP: 9 (ref 5–15)
BUN: 10 mg/dL (ref 6–20)
CHLORIDE: 107 mmol/L (ref 101–111)
CO2: 24 mmol/L (ref 22–32)
Calcium: 9.8 mg/dL (ref 8.9–10.3)
Creatinine, Ser: 0.68 mg/dL (ref 0.44–1.00)
GFR calc Af Amer: >60 mL/min (ref >60)
Glucose, Bld: 96 mg/dL (ref 65–99)
POTASSIUM: 3.9 mmol/L (ref 3.5–5.1)
SODIUM: 140 mmol/L (ref 135–145)

## 2015-11-12 LAB — CBC
HEMATOCRIT: 36.3 % (ref 36.0–46.0)
HEMOGLOBIN: 11.6 g/dL — AB (ref 12.0–15.0)
MCH: 29.3 pg (ref 26.0–34.0)
MCHC: 32 g/dL (ref 30.0–36.0)
MCV: 91.7 fL (ref 78.0–100.0)
Platelets: 293 10*3/uL (ref 150–400)
RBC: 3.96 MIL/uL (ref 3.87–5.11)
RDW: 13.6 % (ref 11.5–15.5)
WBC: 6.6 10*3/uL (ref 4.0–10.5)

## 2015-11-12 LAB — I-STAT TROPONIN, ED: Troponin i, poc: 0 ng/mL (ref 0.00–0.08)

## 2015-11-12 MED ORDER — ESZOPICLONE 1 MG PO TABS: 1.0000 mg | ORAL_TABLET | Freq: Every evening | ORAL | Status: DC | PRN

## 2015-11-12 NOTE — ED Notes (Signed)
Patient complains of left anterior chest pain with radiation to left neck x 1 week. States that the pain is intermittent and pain worse with expiration.  describes as heavines

## 2015-11-12 NOTE — ED Notes (Signed)
Patient transported to X-ray 

## 2015-11-12 NOTE — Discharge Instructions (Signed)
Insomnia Insomnia is a sleep disorder that makes it difficult to fall asleep or to stay asleep. Insomnia can cause tiredness (fatigue), low energy, difficulty concentrating, mood swings, and poor performance at work or school.  There are three different ways to classify insomnia:  Difficulty falling asleep.  Difficulty staying asleep.  Waking up too early in the morning. Any type of insomnia can be long-term (chronic) or short-term (acute). Both are common. Short-term insomnia usually lasts for three months or less. Chronic insomnia occurs at least three times a week for longer than three months. CAUSES  Insomnia may be caused by another condition, situation, or substance, such as:  Anxiety.  Certain medicines.  Gastroesophageal reflux disease (GERD) or other gastrointestinal conditions.  Asthma or other breathing conditions.  Restless legs syndrome, sleep apnea, or other sleep disorders.  Chronic pain.  Menopause. This may include hot flashes.  Stroke.  Abuse of alcohol, tobacco, or illegal drugs.  Depression.  Caffeine.   Neurological disorders, such as Alzheimer disease.  An overactive thyroid (hyperthyroidism). The cause of insomnia may not be known. RISK FACTORS Risk factors for insomnia include:  Gender. Women are more commonly affected than men.  Age. Insomnia is more common as you get older.  Stress. This may involve your professional or personal life.  Income. Insomnia is more common in people with lower income.  Lack of exercise.   Irregular work schedule or night shifts.  Traveling between different time zones. SIGNS AND SYMPTOMS If you have insomnia, trouble falling asleep or trouble staying asleep is the main symptom. This may lead to other symptoms, such as:  Feeling fatigued.  Feeling nervous about going to sleep.  Not feeling rested in the morning.  Having trouble concentrating.  Feeling irritable, anxious, or depressed. TREATMENT    Treatment for insomnia depends on the cause. If your insomnia is caused by an underlying condition, treatment will focus on addressing the condition. Treatment may also include:   Medicines to help you sleep.  Counseling or therapy.  Lifestyle adjustments. HOME CARE INSTRUCTIONS   Take medicines only as directed by your health care provider.  Keep regular sleeping and waking hours. Avoid naps.  Keep a sleep diary to help you and your health care provider figure out what could be causing your insomnia. Include:   When you sleep.  When you wake up during the night.  How well you sleep.   How rested you feel the next day.  Any side effects of medicines you are taking.  What you eat and drink.   Make your bedroom a comfortable place where it is easy to fall asleep:  Put up shades or special blackout curtains to block light from outside.  Use a white noise machine to block noise.  Keep the temperature cool.   Exercise regularly as directed by your health care provider. Avoid exercising right before bedtime.  Use relaxation techniques to manage stress. Ask your health care provider to suggest some techniques that may work well for you. These may include:  Breathing exercises.  Routines to release muscle tension.  Visualizing peaceful scenes.  Cut back on alcohol, caffeinated beverages, and cigarettes, especially close to bedtime. These can disrupt your sleep.  Do not overeat or eat spicy foods right before bedtime. This can lead to digestive discomfort that can make it hard for you to sleep.  Limit screen use before bedtime. This includes:  Watching TV.  Using your smartphone, tablet, and computer.  Stick to a routine.  This can help you fall asleep faster. Try to do a quiet activity, brush your teeth, and go to bed at the same time each night.  Get out of bed if you are still awake after 15 minutes of trying to sleep. Keep the lights down, but try reading or  doing a quiet activity. When you feel sleepy, go back to bed.  Make sure that you drive carefully. Avoid driving if you feel very sleepy.  Keep all follow-up appointments as directed by your health care provider. This is important. SEEK MEDICAL CARE IF:   You are tired throughout the day or have trouble in your daily routine due to sleepiness.  You continue to have sleep problems or your sleep problems get worse. SEEK IMMEDIATE MEDICAL CARE IF:   You have serious thoughts about hurting yourself or someone else.   This information is not intended to replace advice given to you by your health care provider. Make sure you discuss any questions you have with your health care provider.   Document Released: 05/04/2000 Document Revised: 01/26/2015 Document Reviewed: 02/05/2014 Elsevier Interactive Patient Education 2016 [employer]  Nonspecific Chest Pain  Chest pain can be caused by many different conditions. There is always a chance that your pain could be related to something serious, such as a heart attack or a blood clot in your lungs. Chest pain can also be caused by conditions that are not life-threatening. If you have chest pain, it is very important to follow up with your health care provider. CAUSES  Chest pain can be caused by:  Heartburn.  Pneumonia or bronchitis.  Anxiety or stress.  Inflammation around your heart (pericarditis) or lung (pleuritis or pleurisy).  A blood clot in your lung.  A collapsed lung (pneumothorax). It can develop suddenly on its own (spontaneous pneumothorax) or from trauma to the chest.  Shingles infection (varicella-zoster virus).  Heart attack.  Damage to the bones, muscles, and cartilage that make up your chest wall. This can include:  Bruised bones due to injury.  Strained muscles or cartilage due to frequent or repeated coughing or overwork.  Fracture to one or more ribs.  Sore cartilage due to inflammation  (costochondritis). RISK FACTORS  Risk factors for chest pain may include:  Activities that increase your risk for trauma or injury to your chest.  Respiratory infections or conditions that cause frequent coughing.  Medical conditions or overeating that can cause heartburn.  Heart disease or family history of heart disease.  Conditions or health behaviors that increase your risk of developing a blood clot.  Having had chicken pox (varicella zoster). SIGNS AND SYMPTOMS Chest pain can feel like:  Burning or tingling on the surface of your chest or deep in your chest.  Crushing, pressure, aching, or squeezing pain.  Dull or sharp pain that is worse when you move, cough, or take a deep breath.  Pain that is also felt in your back, neck, shoulder, or arm, or pain that spreads to any of these areas. Your chest pain may come and go, or it may stay constant. DIAGNOSIS Lab tests or other studies may be needed to find the cause of your pain. Your health care provider may have you take a test called an ambulatory ECG (electrocardiogram). An ECG records your heartbeat patterns at the time the test is performed. You may also have other tests, such as:  Transthoracic echocardiogram (TTE). During echocardiography, sound waves are used to create a picture of all of the  heart structures and to look at how blood flows through your heart.  Transesophageal echocardiogram (TEE).This is a more advanced imaging test that obtains images from inside your body. It allows your health care provider to see your heart in finer detail.  Cardiac monitoring. This allows your health care provider to monitor your heart rate and rhythm in real time.  Holter monitor. This is a portable device that records your heartbeat and can help to diagnose abnormal heartbeats. It allows your health care provider to track your heart activity for several days, if needed.  Stress tests. These can be done through exercise or by  taking medicine that makes your heart beat more quickly.  Blood tests.  Imaging tests. TREATMENT  Your treatment depends on what is causing your chest pain. Treatment may include:  Medicines. These may include:  Acid blockers for heartburn.  Anti-inflammatory medicine.  Pain medicine for inflammatory conditions.  Antibiotic medicine, if an infection is present.  Medicines to dissolve blood clots.  Medicines to treat coronary artery disease.  Supportive care for conditions that do not require medicines. This may include:  Resting.  Applying heat or cold packs to injured areas.  Limiting activities until pain decreases. HOME CARE INSTRUCTIONS  If you were prescribed an antibiotic medicine, finish it all even if you start to feel better.  Avoid any activities that bring on chest pain.  Do not use any tobacco products, including cigarettes, chewing tobacco, or electronic cigarettes. If you need help quitting, ask your health care provider.  Do not drink alcohol.  Take medicines only as directed by your health care provider.  Keep all follow-up visits as directed by your health care provider. This is important. This includes any further testing if your chest pain does not go away.  If heartburn is the cause for your chest pain, you may be told to keep your head raised (elevated) while sleeping. This reduces the chance that acid will go from your stomach into your esophagus.  Make lifestyle changes as directed by your health care provider. These may include:  Getting regular exercise. Ask your health care provider to suggest some activities that are safe for you.  Eating a heart-healthy diet. A registered dietitian can help you to learn healthy eating options.  Maintaining a healthy weight.  Managing diabetes, if necessary.  Reducing stress. SEEK MEDICAL CARE IF:  Your chest pain does not go away after treatment.  You have a rash with blisters on your  chest.  You have a fever. SEEK IMMEDIATE MEDICAL CARE IF:   Your chest pain is worse.  You have an increasing cough, or you cough up blood.  You have severe abdominal pain.  You have severe weakness.  You faint.  You have chills.  You have sudden, unexplained chest discomfort.  You have sudden, unexplained discomfort in your arms, back, neck, or jaw.  You have shortness of breath at any time.  You suddenly start to sweat, or your skin gets clammy.  You feel nauseous or you vomit.  You suddenly feel light-headed or dizzy.  Your heart begins to beat quickly, or it feels like it is skipping beats. These symptoms may represent a serious problem that is an emergency. Do not wait to see if the symptoms will go away. Get medical help right away. Call your local emergency services (911 in the [place] Do not drive yourself to the hospital.   This information is not intended to replace advice given to you by  your health care provider. Make sure you discuss any questions you have with your health care provider.   Document Released: 02/14/2005 Document Revised: 05/28/2014 Document Reviewed: 12/11/2013 Elsevier Interactive Patient Education [employer].

## 2015-11-12 NOTE — ED Notes (Signed)
PT ambulated with baseline gait; VSS; A&Ox3; no signs of distress; respirations even and unlabored; skin warm and dry; no questions upon discharge.  

## 2015-11-13 NOTE — ED Provider Notes (Signed)
CSN: [id number]     Arrival date & time 11/12/15  1137 History   First MD Initiated Contact with Patient 11/12/15 1156     Chief Complaint  Patient presents with  . Chest Pain     ) HPI Patient complains of left anterior chest pain with radiation to left neck x 1 week. States that the pain is intermittent and pain worse with expiration. describes as heavines.She states this heaviness is been present for the last 24 hours constantly.  She also admits to insomnia not been able to sleep.  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  Patient denies diaphoresis, nausea, vomiting. Past Medical History  Diagnosis Date  . Obesity   . GERD (gastroesophageal reflux disease)   . Atypical chest pain     secondary to GERD  . Head ache     Right sided    Past Surgical History  Procedure Laterality Date  . Abdominal hysterectomy      with BSO   Family History  Problem Relation Age of Onset  . Cancer Mother     breast  . Hypertension Mother   . Cancer Father     colon in early 37's  . Colon cancer Father     early 77's  . Stroke Paternal Grandmother   . Diabetes Paternal Grandmother   . Diabetes    . Breast cancer Maternal Aunt    Social History  Substance Use Topics  . Smoking status: Never Smoker   . Smokeless tobacco: Never Used  . Alcohol Use: No   OB History    No data available     Review of Systems  All other systems reviewed and are negative  Allergies  Review of patient's allergies indicates no known allergies.  Home Medications   Prior to Admission medications   Medication Sig Start Date End Date Taking? Authorizing Provider  CALCIUM PO Take by mouth.    Historical Provider, MD  Esomeprazole Magnesium (NEXIUM PO) Take by mouth.    Historical Provider, MD  estrogens, conjugated, (PREMARIN) 0.9 MG tablet Take 1 tablet (0.9 mg total) by mouth daily. Take daily for 21 days then do not take for 7 days. 04/02/11   Burnice Logan,  MD  eszopiclone (LUNESTA) 1 MG TABS tablet Take 1 tablet (1 mg total) by mouth at bedtime as needed for sleep. Take immediately before bedtime 11/12/15   Leonard Schwartz, MD  Multiple Vitamins-Minerals (MULTIVITAMIN WITH MINERALS) tablet Take 1 tablet by mouth daily.    Historical Provider, MD  SUMAtriptan (IMITREX) 50 MG tablet Take 1 tablet (50 mg total) by mouth every 2 (two) hours as needed for migraine. May repeat in 2 hours if headache persists or recurs. 04/27/15   Dorothyann Peng, NP  vitamin C (ASCORBIC ACID) 500 MG tablet Take 500 mg by mouth daily.    Historical Provider, MD   BP 96/63 mmHg  Pulse 63  Temp(Src) 98.8 F (37.1 C) (Oral)  Resp 18  Ht 5\' 1"  (1.549 m)  Wt 189 lb (85.73 kg)  BMI 35.73 kg/m2  SpO2 100% Physical Exam Physical Exam  Nursing note and vitals reviewed. Constitutional: She is oriented to person, place, and time. She appears well-developed and well-nourished. No distress.  HENT:  Head: Normocephalic and atraumatic.  Eyes: Pupils are equal, round, and reactive to light.  Neck: Normal range of motion.  Cardiovascular: Normal rate and intact distal pulses.   Pulmonary/Chest: No respiratory  distress.  Abdominal: Normal appearance. She exhibits no distension.  Musculoskeletal: Normal range of motion.  Patient does have some anterior chest wall tenderness to palpation.   Neurological: She is alert and oriented to person, place, and time. No cranial nerve deficit.  Skin: Skin is warm and dry. No rash noted.  Psychiatric: She has a normal mood and affect. Her behavior is normal.   ED Course  Procedures (including critical care time) Labs Review Labs Reviewed  CBC - Abnormal; Notable for the following:    Hemoglobin 11.6 (*)    All other components within normal limits  BASIC METABOLIC PANEL  I-STAT TROPOININ, ED    Imaging Review Dg Chest 2 View  11/12/2015  CLINICAL DATA:  Chest pain EXAM: CHEST  2 VIEW COMPARISON:  None. FINDINGS: Normal heart size.  Normal mediastinal contour. No pneumothorax. No pleural effusion. Lungs appear clear, with no acute consolidative airspace disease and no pulmonary edema. IMPRESSION: No active cardiopulmonary disease. Electronically Signed   By: Ilona Sorrel M.D.   On: 11/12/2015 12:31   I have personally reviewed and evaluated these images and lab results as part of my medical decision-making.   EKG Interpretation   Date/Time:  Saturday November 12 2015 11:44:12 EDT Ventricular Rate:  71 PR Interval:  136 QRS Duration: 68 QT Interval:  386 QTC Calculation: 419 R Axis:   73 Text Interpretation:  Normal sinus rhythm Normal ECG Confirmed by Hogan Hoobler   MD, Iver Miklas ([id number]) on 11/12/2015 11:57:08 AM      MDM   Final diagnoses:  Chest discomfort  Insomnia        Leonard Schwartz, MD 11/13/15 [phone]

## 2015-12-21 ENCOUNTER — Encounter (HOSPITAL_COMMUNITY): Payer: Self-pay

## 2015-12-27 ENCOUNTER — Ambulatory Visit (HOSPITAL_COMMUNITY): Payer: PRIVATE HEALTH INSURANCE | Attending: Family Medicine | Admitting: Physical Therapy

## 2015-12-27 DIAGNOSIS — M542 Cervicalgia: Secondary | ICD-10-CM | POA: Insufficient documentation

## 2015-12-27 DIAGNOSIS — M546 Pain in thoracic spine: Secondary | ICD-10-CM | POA: Insufficient documentation

## 2015-12-27 DIAGNOSIS — M545 Low back pain, unspecified: Secondary | ICD-10-CM

## 2015-12-27 DIAGNOSIS — R29898 Other symptoms and signs involving the musculoskeletal system: Secondary | ICD-10-CM | POA: Insufficient documentation

## 2015-12-27 NOTE — Therapy (Signed)
[place], [place], [address] Phone: [phone]   Fax:  [phone]  Physical Therapy Evaluation  Patient Details  Name: Beverly Townsend MRN: [id number] Date of Birth: 1970-07-14 Referring Provider: Odis Luster   Encounter Date: 12/27/2015      PT End of Session - 12/27/15 1206    Visit Number 1   Number of Visits 4   Date for PT Re-Evaluation 01/24/16   Authorization Type [employer] (6 visit limit)   Authorization Time Period 12/27/15 to 01/27/16   Authorization - Visit Number 1   Authorization - Number of Visits 6   PT Start Time 1120   PT Stop Time 1200   PT Time Calculation (min) 40 min   Activity Tolerance Patient tolerated treatment well   Behavior During Therapy [facility] for tasks assessed/performed      Past Medical History:  Diagnosis Date  . Atypical chest pain    secondary to GERD  . GERD (gastroesophageal reflux disease)   . Head ache    Right sided   . Obesity     Past Surgical History:  Procedure Laterality Date  . ABDOMINAL HYSTERECTOMY     with BSO    There were no vitals filed for this visit.       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 Pain started and she went to see the MD on the 28th, 1 week after the injury. The MD send her directly to PT and also a sheet of exercises to try as well.  She describeds the pain as a sort of "hollow" feeling, especially when she is movnig, sitting, laying. She is on light duty at work.     Pertinent History no significant PMH   Diagnostic tests no images done    Patient Stated Goals get rid of pain, sleep better    Currently in Pain? No/denies   Pain Score 2    Pain Location Other (Comment)  neck, thoracic, lumbar, B shoulders    Pain Orientation  Right;Left;Mid   Pain Descriptors / Indicators Other (Comment);Numbness  "hollow, stretching pain"    Pain Type Acute pain   Pain Radiating Towards none    Pain Onset 1 to 4 weeks ago   Pain Frequency Intermittent   Aggravating Factors  work based tasks    Pain Relieving Factors ibuprofen   Effect of Pain on Daily Activities limitinig ability to return to work full duty            [facility] PT Assessment - 12/27/15 0001      Assessment   Medical Diagnosis neckk, back, shoulder    Referring Provider Odis Luster    Onset Date/Surgical Date 12/09/15   Next MD Visit Dr. Maylon Peppers this month      Precautions   Precaution Comments light duty at work, can only lift 10-15#     Balance Screen   Has the patient fallen in the past 6 months No   Has the patient had a decrease in activity level because of a fear of falling?  No   Is the patient reluctant to leave their home because of a fear of falling?  No     Prior Function   Level of Independence  Independent;Independent with basic ADLs;Independent with gait;Independent with transfers   Vocation Full time employment   Vocation Requirements CNA    Leisure cooking, entertaining      Posture/Postural Control   Posture/Postural Control Postural limitations   Postural Limitations Rounded Shoulders;Forward head;Increased lumbar lordosis     AROM   Right Shoulder Flexion --  min limiation, WFL with overpressure    Right Shoulder ABduction --  WFL with overpressure    Right Shoulder Internal Rotation --  approx T7   Right Shoulder External Rotation --  approx T3    Left Shoulder Flexion --  WFL with overpressure, min limiation initially    Left Shoulder ABduction --  WFL with overpressure    Left Shoulder Internal Rotation --  approx T7   Left Shoulder External Rotation --  approx T1    Cervical Flexion 19  WFL with over pressure    Cervical Extension 9  WFL with overpressure    Cervical - Right Side Bend 18  WFL with  overpressure    Cervical - Left Side Bend 24  min limiation with overpressure    Cervical - Right Rotation 52  WFL with overpressure   Cervical - Left Rotation 53  WFL with overpressure    Lumbar Flexion 31   Lumbar Extension 9   Lumbar - Right Side Bend fingertips of midline of knee joint    Lumbar - Left Side Bend fingertips midline of knee joint   Thoracic Flexion min limitation    Thoracic Extension min limiation    Thoracic - Right Side Bend WFL R    Thoracic - Left Side Bend mod limiation L    Thoracic - Right Rotation min limiation   WFL with overpressure    Thoracic - Left Rotation min limiation   WFL with overpressure      Strength   Right Shoulder Flexion 4/5   Right Shoulder ABduction 4/5   Right Shoulder Internal Rotation 4/5   Right Shoulder External Rotation 4-/5   Left Shoulder Flexion 4/5   Left Shoulder ABduction 4/5   Left Shoulder Internal Rotation 5/5   Left Shoulder External Rotation 4-/5   Right Hip Flexion 3/5   Right Hip Extension 3-/5   Right Hip ABduction 4+/5   Left Hip Flexion 3/5   Left Hip Extension 3-/5   Left Hip ABduction 4+/5   Right Knee Flexion 4/5   Right Knee Extension 4/5   Left Knee Flexion 4/5   Left Knee Extension 4/5   Right Ankle Dorsiflexion 5/5   Left Ankle Dorsiflexion 5/5     Flexibility   Hamstrings WFL    Piriformis WLF with mild tightness                            PT Education - 12/27/15 1206    Education provided Yes   Education Details Prognosis, PT POC, HEP    Person(s) Educated Patient   Methods Explanation;Demonstration;Handout   Comprehension Verbalized understanding;Returned demonstration;Need further instruction          PT Short Term Goals - 12/27/15 1216      PT SHORT TERM GOAL #1   Title Patient to demonstrate correct posture at least 80% of the time in order to reduce symptoms and improve function    Time 2   Period Weeks   Status New     PT SHORT TERM GOAL #2   Title  Patient to  experience pain no more than 1/10 in order to facilitate return to work    Time 2   Period Weeks   Status New     PT SHORT TERM GOAL #3   Title Patient to be independent in correctly and consistetly performing appropriate HEP, to be updated PRN    Time 2   Period Weeks   Status New           PT Long Term Goals - 12/27/15 1218      PT LONG TERM GOAL #1   Title Patient to demonstrate functional strength 5/5 in order to assist in reducing pain and improving function    Time 4   Period Weeks   Status New     PT LONG TERM GOAL #2   Title Patient to be able to correctly perform functional lifting and will demonstrate correct biomechanics related to her job as a Quarry manager in order to prevent exacerbation of pain    Time 4   Period Weeks   Status New     PT LONG TERM GOAL #3   Title Patient to reduce FOTO limitation score to less than 20% in order to show improved overall function and improvement of symptoms    Time 4   Period Weeks   Status New               Plan - 12/27/15 1209    Clinical Impression Statement Patient arrives today after an incident at work in which she was participating in a +2 transfer to slide a patient up in bed and felt an entire body "popping" and felt pain throughout her entire back; she was seen by worker's comp MD one week later. Upon examination, patient tends to severely actively limit her ROM in her shoulders, cervical, thoracic, and lumbar spines, however she is able to attain [facility] ROM with overpressure from PT and no sensation of muscle guarding or muscle stiffness during overpressure tasks however patient states that this is painful. No pain exacerbated during MMT, and no muscle guarding noted during palpation of paraspinals beyond ordinary muscle tension. She does demonstrate some mild strength impairment as well as postural impairment and hypomobility throughout her spine. Patient appears to demonstrate minimal effort during examination  today. At this time recommend short stint of skilled PT services to address posture, functional lifting mechanics, strength, and spine hypomobility.    Rehab Potential Excellent   Clinical Impairments Affecting Rehab Potential impaired perception of disability    PT Frequency 1x / week   PT Duration 4 weeks   PT Treatment/Interventions ADLs/Self Care Home Management;Cryotherapy;Moist Heat;Functional mobility training;Therapeutic activities;Therapeutic exercise;Balance training;Neuromuscular re-education;Patient/family education;Manual techniques;Energy conservation;Taping   PT Next Visit Plan review HEP and goals; postural training, functional lifting, functional strength, spine PAs.    PT Home Exercise Plan 3D thoracic and hip excursions, she is already doing cervical from MD    Consulted and Agree with Plan of Care Patient      Patient will benefit from skilled therapeutic intervention in order to improve the following deficits and impairments:  Improper body mechanics, Pain, Postural dysfunction, Hypomobility  Visit Diagnosis: Bilateral low back pain without sciatica - Plan: PT plan of care cert/re-cert  Other symptoms and signs involving the musculoskeletal system - Plan: PT plan of care cert/re-cert  Pain in thoracic spine - Plan: PT plan of care cert/re-cert  Cervicalgia - Plan: PT plan of care cert/re-cert     Problem List Patient Active Problem List  Diagnosis Date Noted  . Preventative health care 10/20/2012  . Family history of colon cancer 04/08/2011  . BACK PAIN 07/18/2010  . HYPERGLYCEMIA 03/14/2010  . OBESITY, UNSPECIFIED 02/27/2010  . HEADACHES, HX OF 06/18/2007    Deniece Ree PT, DPT [place] [address] [place], [place], [address] Phone: [phone]   Fax:  [phone]  Name: GIANNIE MCPHATTER MRN: [id number] Date of Birth: May 26, 1970

## 2016-01-16 ENCOUNTER — Ambulatory Visit (HOSPITAL_COMMUNITY): Payer: 59 | Attending: Family Medicine | Admitting: Physical Therapy

## 2016-01-18 ENCOUNTER — Telehealth (HOSPITAL_COMMUNITY): Payer: Self-pay

## 2016-01-18 NOTE — Telephone Encounter (Signed)
8/30 called because of no show on 8/28 and pt said she forgot about that appt.  She will look at her schedule and call back to [facility]

## 2016-07-24 ENCOUNTER — Other Ambulatory Visit: Payer: Self-pay | Admitting: Adult Health

## 2016-07-24 DIAGNOSIS — Z1231 Encounter for screening mammogram for malignant neoplasm of breast: Secondary | ICD-10-CM

## 2016-08-20 ENCOUNTER — Ambulatory Visit
Admission: RE | Admit: 2016-08-20 | Discharge: 2016-08-20 | Disposition: A | Discharge status: Home / Self Care | Payer: 59 | Source: Ambulatory Visit | Attending: Adult Health | Admitting: Adult Health

## 2016-08-20 DIAGNOSIS — Z1231 Encounter for screening mammogram for malignant neoplasm of breast: Secondary | ICD-10-CM

## 2016-12-19 ENCOUNTER — Telehealth: Payer: Self-pay | Admitting: Adult Health

## 2016-12-19 NOTE — Telephone Encounter (Signed)
Tommi Rumps, will need your authorization in order for pt to switch.  Please advise.

## 2016-12-19 NOTE — Telephone Encounter (Signed)
Transfer okay? 

## 2016-12-19 NOTE — Telephone Encounter (Signed)
Pt would like to switch from cory to dr Martinique. Can I sch?

## 2016-12-20 NOTE — Telephone Encounter (Signed)
Ok with me 

## 2016-12-20 NOTE — Telephone Encounter (Signed)
Fine with me

## 2016-12-20 NOTE — Telephone Encounter (Signed)
lmom for pt to callbaack

## 2016-12-25 NOTE — Telephone Encounter (Signed)
Pt will callback to sch with dr Martinique

## 2017-02-04 NOTE — Progress Notes (Deleted)
HPI:  [name] is a 46 y.o.female here today for his routine physical examination.  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  Last CPE: *** He lives with ***  Regular exercise 3 or more times per week: *** Following a healthy diet: ***   Chronic medical problems: GERD,back pain,prediabetes,insomnia,and migraine-headache among some.  Pap smear: S/P hysterectomy on hormonal therapy. Hx of abnormal pap smears: *** Mammogram: 08/2016 [facility] 1  Hx of STD's: ***  Immunization History  Administered Date(s) Administered  . Influenza Whole 08/14/2004, 03/14/2010  . Influenza-Unspecified 02/19/2015  . Td 04/08/2006    Colonoscopy 04/2015.  -Concerns and/or follow up today: ***    Review of Systems   Current Outpatient Prescriptions on File Prior to Visit  Medication Sig Dispense Refill  . CALCIUM PO Take by mouth.    . Esomeprazole Magnesium (NEXIUM PO) Take by mouth.    . estrogens, conjugated, (PREMARIN) 0.9 MG tablet Take 1 tablet (0.9 mg total) by mouth daily. Take daily for 21 days then do not take for 7 days. 30 tablet 6  . eszopiclone (LUNESTA) 1 MG TABS tablet Take 1 tablet (1 mg total) by mouth at bedtime as needed for sleep. Take immediately before bedtime 30 tablet 1  . Multiple Vitamins-Minerals (MULTIVITAMIN WITH MINERALS) tablet Take 1 tablet by mouth daily.    . SUMAtriptan (IMITREX) 50 MG tablet Take 1 tablet (50 mg total) by mouth every 2 (two) hours as needed for migraine. May repeat in 2 hours if headache persists or recurs. 10 tablet 3  . vitamin C (ASCORBIC ACID) 500 MG tablet Take 500 mg by mouth daily.     No current facility-administered medications on file prior to visit.      Past Medical History:  Diagnosis Date  . Atypical chest pain    secondary to GERD  . GERD (gastroesophageal reflux disease)   . Head ache    Right sided   . Obesity     Past Surgical History:  Procedure Laterality Date  . ABDOMINAL HYSTERECTOMY     with BSO    . BREAST CYST ASPIRATION      No Known Allergies  Family History  Problem Relation Age of Onset  . Cancer Mother        breast  . Hypertension Mother   . Breast cancer Mother   . Cancer Father        colon in early 52's  . Colon cancer Father        early 62's  . Stroke Paternal Grandmother   . Diabetes Paternal Grandmother   . Diabetes Unknown   . Breast cancer Maternal Aunt     Social History   Social History  . Marital status: Married    Spouse name: N/A  . Number of children: 1  . Years of education: N/A   Occupational History  . CNA    Social History Main Topics  . Smoking status: Never Smoker  . Smokeless tobacco: Never Used  . Alcohol use No  . Drug use: No  . Sexual activity: Not on file   Other Topics Concern  . Not on file   Social History Narrative   She is a CNA at [employer]    Married for 32 years    45 year old boy    She likes to cook and entertain.      There were no vitals filed for this visit. There is no height  or weight on file to calculate BMI.   Wt Readings from Last 3 Encounters:  11/12/15 189 lb (85.7 kg)  05/18/15 193 lb (87.5 kg)  05/09/15 193 lb (87.5 kg)      Physical Exam      ASSESSMENT AND PLAN:   Discussed the following assessment and plan:   There are no diagnoses linked to this encounter.       No Follow-up on file.    Beverly Amacher G. [place]ique, MD  [facility]. [place] office.

## 2017-02-05 ENCOUNTER — Encounter: Payer: Self-pay | Admitting: Family Medicine

## 2017-02-05 DIAGNOSIS — Z0289 Encounter for other administrative examinations: Secondary | ICD-10-CM

## 2017-02-07 ENCOUNTER — Encounter: Payer: Self-pay | Admitting: Family Medicine

## 2017-04-04 ENCOUNTER — Encounter (HOSPITAL_COMMUNITY): Payer: Self-pay | Admitting: Physical Therapy

## 2017-04-04 NOTE — Therapy (Signed)
[place], [place], [address] Phone: [phone]   Fax:  [phone]  Patient Details  Name: Beverly Townsend MRN: [id number] Date of Birth: [birth year] Referring Provider:  No ref. provider found  Encounter Date: 04/04/2017   PHYSICAL THERAPY DISCHARGE SUMMARY  Visits from Start of Care: 1  Current functional level related to goals / functional outcomes: Patient did not return after initial evaluation, DC.    Remaining deficits: Unable to assess    Education / Equipment: None  Plan: Patient agrees to discharge.  Patient goals were not met. Patient is being discharged due to not returning since the last visit.  ?????     Deniece Ree PT, DPT, CBIS  Supplemental Physical Therapist [place] [address] [place], [place], [address] Phone: [phone]   Fax:  [phone]

## 2017-08-15 ENCOUNTER — Other Ambulatory Visit: Payer: Self-pay | Admitting: Adult Health

## 2017-08-15 DIAGNOSIS — Z1231 Encounter for screening mammogram for malignant neoplasm of breast: Secondary | ICD-10-CM

## 2017-08-23 ENCOUNTER — Ambulatory Visit (INDEPENDENT_AMBULATORY_CARE_PROVIDER_SITE_OTHER): Payer: 59 | Admitting: Family Medicine

## 2017-08-23 ENCOUNTER — Other Ambulatory Visit (HOSPITAL_COMMUNITY)
Admission: RE | Admit: 2017-08-23 | Discharge: 2017-08-23 | Disposition: A | Discharge status: Home / Self Care | Payer: 59 | Source: Ambulatory Visit | Attending: Family Medicine | Admitting: Family Medicine

## 2017-08-23 ENCOUNTER — Encounter: Payer: Self-pay | Admitting: Family Medicine

## 2017-08-23 VITALS — BP 126/72 | HR 78 | Temp 98.3°F | Resp 12 | Ht 61.0 in | Wt 190.5 lb

## 2017-08-23 DIAGNOSIS — Z113 Encounter for screening for infections with a predominantly sexual mode of transmission: Secondary | ICD-10-CM

## 2017-08-23 DIAGNOSIS — Z131 Encounter for screening for diabetes mellitus: Secondary | ICD-10-CM | POA: Diagnosis not present

## 2017-08-23 DIAGNOSIS — K921 Melena: Secondary | ICD-10-CM | POA: Diagnosis not present

## 2017-08-23 DIAGNOSIS — Z0001 Encounter for general adult medical examination with abnormal findings: Secondary | ICD-10-CM | POA: Diagnosis not present

## 2017-08-23 DIAGNOSIS — Z129 Encounter for screening for malignant neoplasm, site unspecified: Secondary | ICD-10-CM

## 2017-08-23 DIAGNOSIS — R51 Headache: Secondary | ICD-10-CM

## 2017-08-23 DIAGNOSIS — R1310 Dysphagia, unspecified: Secondary | ICD-10-CM | POA: Diagnosis not present

## 2017-08-23 DIAGNOSIS — Z124 Encounter for screening for malignant neoplasm of cervix: Secondary | ICD-10-CM | POA: Insufficient documentation

## 2017-08-23 DIAGNOSIS — Z13220 Encounter for screening for lipoid disorders: Secondary | ICD-10-CM | POA: Diagnosis not present

## 2017-08-23 DIAGNOSIS — R519 Headache, unspecified: Secondary | ICD-10-CM

## 2017-08-23 DIAGNOSIS — Z0000 Encounter for general adult medical examination without abnormal findings: Secondary | ICD-10-CM

## 2017-08-23 LAB — BASIC METABOLIC PANEL
BUN: 14 mg/dL (ref 6–23)
CALCIUM: 9.3 mg/dL (ref 8.4–10.5)
CO2: 29 mEq/L (ref 19–32)
CREATININE: 0.66 mg/dL (ref 0.40–1.20)
Chloride: 99 mEq/L (ref 96–112)
GFR: 124.16 mL/min (ref >60.00)
GLUCOSE: 84 mg/dL (ref 70–99)
Potassium: 3.5 mEq/L (ref 3.5–5.1)
SODIUM: 136 meq/L (ref 135–145)

## 2017-08-23 LAB — CBC
HCT: 34.4 % — ABNORMAL LOW (ref 36.0–46.0)
Hemoglobin: 11.5 g/dL — ABNORMAL LOW (ref 12.0–15.0)
MCHC: 33.3 g/dL (ref 30.0–36.0)
MCV: 93.1 fl (ref 78.0–100.0)
PLATELETS: 249 10*3/uL (ref 150.0–400.0)
RBC: 3.69 Mil/uL — ABNORMAL LOW (ref 3.87–5.11)
RDW: 13.9 % (ref 11.5–15.5)
WBC: 6 10*3/uL (ref 4.0–10.5)

## 2017-08-23 LAB — LIPID PANEL
Cholesterol: 153 mg/dL (ref 0–200)
HDL: 49.3 mg/dL (ref >39.00)
LDL Cholesterol: 95 mg/dL (ref 0–99)
NonHDL: 103.88
TRIGLYCERIDES: 44 mg/dL (ref 0.0–149.0)
Total CHOL/HDL Ratio: 3
VLDL: 8.8 mg/dL (ref 0.0–40.0)

## 2017-08-23 LAB — HEMOGLOBIN A1C: Hgb A1c MFr Bld: 6.5 % (ref 4.6–6.5)

## 2017-08-23 LAB — TSH: TSH: 1.98 u[IU]/mL (ref 0.35–4.50)

## 2017-08-23 MED ORDER — BACLOFEN 10 MG PO TABS: 10.0000 mg | ORAL_TABLET | Freq: Three times a day (TID) | ORAL | 1 refills | Status: DC | PRN

## 2017-08-23 NOTE — Patient Instructions (Addendum)
A few things to remember from today's visit:   Blood in the stool - Plan: CBC, Ambulatory referral to Gastroenterology  Dysphagia, unspecified type - Plan: Ambulatory referral to Gastroenterology  Diabetes mellitus screening - Plan: Basic metabolic panel, Hemoglobin A1c  Screening for lipid disorders - Plan: Lipid panel  Chronic nonintractable headache, unspecified headache type - Plan: TSH, baclofen (LIORESAL) 10 MG tablet  Screen for STD (sexually transmitted disease) - Plan: HIV antibody, Hepatitis C antibody screen, Cytology - PAP ([place]), HSV(herpes simplex vrs) 1+2 ab-IgG  Malignant neoplasm screen - Plan: Cytology - PAP ([place] [facility])  Routine general medical examination at a health care facility  Today you have you routine preventive visit.  At least 150 minutes of moderate exercise per week, daily brisk walking for 15-30 min is a good exercise option. Healthy diet low in saturated (animal) fats and sweets and consisting of fresh fruits and vegetables, lean meats such as fish and white chicken and whole grains.  These are some of recommendations for screening depending of age and risk factors:   - Vaccines:  Tdap vaccine every 10 years.  Shingles vaccine recommended at age 56, could be given after 46 years of age but not sure about insurance coverage.   Pneumonia vaccines:  Prevnar 13 at 65 and Pneumovax at 58. Sometimes Pneumovax is giving earlier if history of smoking, lung disease,diabetes,kidney disease among some.    Screening for diabetes at age 82 and every 3 years.  Cervical cancer prevention:  S/P hysterectomy.   -Breast cancer: Mammogram: There is disagreement between experts about when to start screening in low risk asymptomatic female but recent recommendations are to start screening at 69 and not later than 46 years old , every 1-2 years and after 46 yo q 2 years. Screening is recommended until 46 years old but some women can continue screening  depending of healthy issues.   Colon cancer screening: starts at 46 years old until 46 years old.  Cholesterol disorder screening at age 75 and every 3 years.  Also recommended:  1. Dental visit- Brush and floss your teeth twice daily; visit your dentist twice a year. 2. Eye doctor- Get an eye exam at least every 2 years. 3. Helmet use- Always wear a helmet when riding a bicycle, motorcycle, rollerblading or skateboarding. 4. Safe sex- If you may be exposed to sexually transmitted infections, use a condom. 5. Seat belts- Seat belts can save your live; always wear one. 6. Smoke/Carbon Monoxide detectors- These detectors need to be installed on the appropriate level of your home. Replace batteries at least once a year. 7. Skin cancer- When out in the sun please cover up and use sunscreen 15 SPF or higher. 8. Violence- If anyone is threatening or hurting you, please tell your healthcare provider.  9. Drink alcohol in moderation- Limit alcohol intake to one drink or less per day. Never drink and drive.  Please be sure medication list is accurate. If a new problem present, please set up appointment sooner than planned today.

## 2017-08-23 NOTE — Progress Notes (Signed)
HPI:   Beverly Townsend is a 47 y.o. female, who is here today for her routine physical. I have not seen Beverly Townsend before, she is former pt of Dr Shawna Orleans.  Last CPE: 04/2015.  Regular exercise 3 or more time per week: Walks 4 times per week,, 3-4 miles. Following a healthy diet: Yes She lives with her husband and son.  Chronic medical problems: Headaches and prediabetes.  Pap smear 2012. S/P hysterectomy at age 17 due to "infection." She was on hormonal therapy but discontinued, has not had a refill in over a year.  Hx of abnormal pap smears: Denies. Hx of STD's: Denies.  Immunization History  Administered Date(s) Administered  . Influenza Whole 08/14/2004, 03/14/2010  . Influenza-Unspecified 02/19/2015  . Td 04/08/2006    Mammogram: 08/2016 Bi-rads 1   She has some concerns today.  Headaches: Right hemi cranial , from frontal ara to occipital. She did not take Imitrex, which was recommended in the past.  She has had it intermittent for years, about q 2 weeks and sometimes she wakes up with headache.  Pressure pain , "pretty bad", takes OTC Ibuprofen. Problem is stable.  No nausea,vomiting,or photophobia.  STD:  Husband Dx recently with genital herpes. She had lesion months ago,which she assumed was due to shaving. She would like HSV ab check.  Dysphagia: In the past month she has had 2 episodes when food gets "caught in" her chest.  She has no noted heartburn or burping. Not sure the specific type of food exacerbating symptoms. No associated abnormal weight loss.  In the past she has been on PPIs.  She is also concerned about blood mixed with her stool, first noted about 2 weeks ago. Father with history of colon cancer diagnosed in her late 11s.  No associated abdominal pain dyschezia, or changes in bowel habits.   Review of Systems  Constitutional: Negative for appetite change, fatigue, fever and unexpected weight change.  HENT: Positive for  trouble swallowing. Negative for dental problem, hearing loss, nosebleeds and voice change.   Eyes: Negative for redness and visual disturbance.  Respiratory: Negative for cough, shortness of breath and wheezing.   Cardiovascular: Negative for chest pain and leg swelling.  Gastrointestinal: Positive for blood in stool. Negative for abdominal pain, nausea and vomiting.       No changes in bowel habits.  Endocrine: Negative for cold intolerance, heat intolerance, polydipsia, polyphagia and polyuria.  Genitourinary: Negative for decreased urine volume, dysuria, hematuria, menstrual problem, vaginal bleeding and vaginal discharge.       No breast tenderness or nipple discharge.  Musculoskeletal: Negative for gait problem and myalgias.  Skin: Negative for rash.  Neurological: Positive for headaches. Negative for syncope, weakness and numbness.  Hematological: Negative for adenopathy. Does not bruise/bleed easily.  Psychiatric/Behavioral: Negative for confusion and sleep disturbance. The patient is nervous/anxious.   All other systems reviewed and are negative.     Current Outpatient Medications on File Prior to Visit  Medication Sig Dispense Refill  . CALCIUM PO Take by mouth.    . Multiple Vitamins-Minerals (MULTIVITAMIN WITH MINERALS) tablet Take 1 tablet by mouth daily.    . vitamin C (ASCORBIC ACID) 500 MG tablet Take 500 mg by mouth daily.     No current facility-administered medications on file prior to visit.      Past Medical History:  Diagnosis Date  . Atypical chest pain    secondary to GERD  . GERD (gastroesophageal reflux  disease)   . Head ache    Right sided   . Obesity     Past Surgical History:  Procedure Laterality Date  . ABDOMINAL HYSTERECTOMY     with BSO  . BREAST CYST ASPIRATION      No Known Allergies  Family History  Problem Relation Age of Onset  . Cancer Mother        breast  . Hypertension Mother   . Breast cancer Mother   . Cancer Father         colon in early 26's  . Colon cancer Father        early 11's  . Stroke Paternal Grandmother   . Diabetes Paternal Grandmother   . Diabetes Unknown   . Breast cancer Maternal Aunt     Social History   Socioeconomic History  . Marital status: Married    Spouse name: Not on file  . Number of children: 1  . Years of education: Not on file  . Highest education level: Not on file  Occupational History  . Occupation: CNA  Social Needs  . Financial resource strain: Not on file  . Food insecurity:    Worry: Not on file    Inability: Not on file  . Transportation needs:    Medical: Not on file    Non-medical: Not on file  Tobacco Use  . Smoking status: Never Smoker  . Smokeless tobacco: Never Used  Substance and Sexual Activity  . Alcohol use: No    Alcohol/week: 0.0 oz  . Drug use: No  . Sexual activity: Not on file  Lifestyle  . Physical activity:    Days per week: Not on file    Minutes per session: Not on file  . Stress: Not on file  Relationships  . Social connections:    Talks on phone: Not on file    Gets together: Not on file    Attends religious service: Not on file    Active member of club or organization: Not on file    Attends meetings of clubs or organizations: Not on file    Relationship status: Not on file  Other Topics Concern  . Not on file  Social History Narrative   She is a CNA at [employer]    Married for 53 years    46 year old boy    She likes to cook and entertain.      Vitals:   08/23/17 1010  BP: 126/72  Pulse: 78  Resp: 12  Temp: 98.3 F (36.8 C)  SpO2: 99%   Body mass index is 35.99 kg/m.   Wt Readings from Last 3 Encounters:  08/23/17 190 lb 8 oz (86.4 kg)  11/12/15 189 lb (85.7 kg)  05/18/15 193 lb (87.5 kg)      Physical Exam  Nursing note and vitals reviewed. Constitutional: She is oriented to person, place, and time. She appears well-developed. No distress.  HENT:  Head: Normocephalic and atraumatic.  Right  Ear: Hearing, tympanic membrane, external ear and ear canal normal.  Left Ear: Hearing, tympanic membrane, external ear and ear canal normal.  Mouth/Throat: Uvula is midline, oropharynx is clear and moist and mucous membranes are normal.  Eyes: Pupils are equal, round, and reactive to light. Conjunctivae and EOM are normal.  Neck: No tracheal deviation present. No thyromegaly present.  Cardiovascular: Normal rate and regular rhythm.  No murmur heard. Pulses:      Dorsalis pedis pulses are 2+ on  the right side, and 2+ on the left side.       Posterior tibial pulses are 2+ on the right side, and 2+ on the left side.  Respiratory: Effort normal and breath sounds normal. No respiratory distress.  GI: Soft. She exhibits no mass. There is no hepatomegaly. There is no tenderness.  Genitourinary: Rectal exam shows no mass, no tenderness and guaiac negative stool. There is no rash, tenderness or lesion on the right labia. There is no rash, tenderness or lesion on the left labia. No erythema or bleeding in the vagina. No vaginal discharge found.  Genitourinary Comments: Cervix and adnexa absent. Breast: No masses,nipple discharge,or skin abnormalities.  Musculoskeletal: She exhibits no edema.  No major deformity or signs of synovitis appreciated.  Lymphadenopathy:    She has no cervical adenopathy.    She has no axillary adenopathy.       Right: No supraclavicular adenopathy present.       Left: No supraclavicular adenopathy present.  Neurological: She is alert and oriented to person, place, and time. She has normal strength. No cranial nerve deficit. Coordination and gait normal.  Reflex Scores:      Bicep reflexes are 2+ on the right side and 2+ on the left side.      Patellar reflexes are 2+ on the right side and 2+ on the left side. Skin: Skin is warm. No rash noted. No erythema.  Psychiatric: She has a normal mood and affect. Her speech is normal.  Well groomed, good eye contact.     ASSESSMENT AND PLAN:  Beverly Townsend was here today annual physical examination.     Orders Placed This Encounter  Procedures  . Basic metabolic panel  . CBC  . Lipid panel  . Hemoglobin A1c  . TSH  . HIV antibody  . Hepatitis C antibody screen  . HSV(herpes simplex vrs) 1+2 ab-IgG  . Ambulatory referral to Gastroenterology   Lab Results  Component Value Date   CHOL 153 08/23/2017   HDL 49.30 08/23/2017   LDLCALC 95 08/23/2017   TRIG 44.0 08/23/2017   CHOLHDL 3 08/23/2017   Lab Results  Component Value Date   WBC 6.0 08/23/2017   HGB 11.5 (L) 08/23/2017   HCT 34.4 (L) 08/23/2017   MCV 93.1 08/23/2017   PLT 249.0 08/23/2017   Lab Results  Component Value Date   TSH 1.98 08/23/2017   Lab Results  Component Value Date   CREATININE 0.66 08/23/2017   BUN 14 08/23/2017   NA 136 08/23/2017   K 3.5 08/23/2017   CL 99 08/23/2017   CO2 29 08/23/2017     Routine general medical examination at a health care facility  We discussed the importance of regular physical activity and healthy diet for prevention of chronic illness and/or complications. Preventive guidelines reviewed. Vaccination up to date per pt report.Tdap through employer. Mammogram scheduled for next week. Ca++ and vit D supplementation recommended. Next CPE in a year.  The 10-year ASCVD risk score Mikey Bussing DC Brooke Bonito., et al., 2013) is: 1%   Values used to calculate the score:     Age: 66 years     Sex: Female     Is Non-Hispanic African American: Yes     Diabetic: No     Tobacco smoker: No     Systolic Blood Pressure: 119 mmHg     Is BP treated: No     HDL Cholesterol: 49.3 mg/dL     Total Cholesterol:  153 mg/dL  Blood in the stool  Guaiac negative today. + FHx for colon cancer. GI referral placed to discuss colonoscopy.  -     CBC -     Ambulatory referral to Gastroenterology  Dysphagia, unspecified type  ? GERD. Recommend GERD precautions. She could also try OTC Nexium 20  mg. GI referral placed.  -     Ambulatory referral to Gastroenterology  Diabetes mellitus screening -     Basic metabolic panel -     Hemoglobin A1c  Screening for lipid disorders -     Lipid panel  Chronic nonintractable headache, unspecified headache type  Tension headache. ?  Treatment options discussed, she would like to try Baclofen. I do not think brain imaging is needed at this time. Follow-up in 3 months.  -     TSH -     baclofen (LIORESAL) 10 MG tablet; Take 1 tablet (10 mg total) by mouth 3 (three) times daily as needed for muscle spasms.  Screen for STD (sexually transmitted disease) -     HIV antibody -     Hepatitis C antibody screen -     Cytology - PAP ([place]) -     HSV(herpes simplex vrs) 1+2 ab-IgG  Malignant neoplasm screen -     Cytology - PAP ([place])     Return in 3 months (on 11/22/2017) for Headache.          Betty G. Martinique, MD  [facility]. [place] office.

## 2017-08-24 ENCOUNTER — Encounter: Payer: Self-pay | Admitting: Family Medicine

## 2017-08-26 ENCOUNTER — Encounter: Payer: Self-pay | Admitting: Family Medicine

## 2017-08-26 LAB — HSV(HERPES SIMPLEX VRS) I + II AB-IGG: HSV 2 IGG, TYPE SPEC: 16.3 {index} — AB

## 2017-08-26 LAB — HEPATITIS C ANTIBODY
HEP C AB: NONREACTIVE
SIGNAL TO CUT-OFF: 0.03 (ref <1.00)

## 2017-08-26 LAB — HIV ANTIBODY (ROUTINE TESTING W REFLEX): HIV: NONREACTIVE

## 2017-08-28 LAB — CYTOLOGY - PAP
Chlamydia: NEGATIVE
Diagnosis: NEGATIVE
HPV (WINDOPATH): NOT DETECTED
Neisseria Gonorrhea: NEGATIVE
Trichomonas: NEGATIVE

## 2017-08-29 ENCOUNTER — Encounter: Payer: Self-pay | Admitting: Family Medicine

## 2017-08-30 ENCOUNTER — Ambulatory Visit: Payer: 59 | Admitting: Gastroenterology

## 2017-08-30 ENCOUNTER — Encounter: Payer: Self-pay | Admitting: Gastroenterology

## 2017-08-30 VITALS — BP 102/74 | HR 80 | Ht 61.0 in | Wt 190.0 lb

## 2017-08-30 DIAGNOSIS — Z800 Family history of malignant neoplasm of digestive organs: Secondary | ICD-10-CM | POA: Diagnosis not present

## 2017-08-30 DIAGNOSIS — K625 Hemorrhage of anus and rectum: Secondary | ICD-10-CM | POA: Diagnosis not present

## 2017-08-30 MED ORDER — NA SULFATE-K SULFATE-MG SULF 17.5-3.13-1.6 GM/177ML PO SOLN: 1.0000 | Freq: Once | ORAL | 0 refills | Status: AC

## 2017-08-30 NOTE — Progress Notes (Signed)
Beverly Townsend    [id number]    06-28-1970  Primary Care Physician:Jordan, Malka So, MD  Referring Physician: Martinique, Betty G, MD [place], [place] [id number]  Chief complaint:  Rectal bleeding  HPI: 47 year old female here with complaints of rectal bleeding intermittently for past 7- 10 days.  She started noticing blood mixed in stool ,is always bright red.  Denies any change in bowel habits, no constipation or diarrhea.  Denies any excessive straining with bowel movement or rectal discomfort.  No abdominal pain, loss of appetite or weight loss.  Denies any dysphagia, nausea or vomiting Father was diagnosed with colon cancer in his early 54s. Colonoscopy May 18, 2015 with removal of small sessile polyp from sigmoid colon, was lymphoid tissue , prior to that colonoscopy April 19, 2010 was normal. Patient is extremely anxious and is worried given her family history, requesting colonoscopy.    Outpatient Encounter Medications as of 08/30/2017  Medication Sig  . baclofen (LIORESAL) 10 MG tablet Take 1 tablet (10 mg total) by mouth 3 (three) times daily as needed for muscle spasms.  Marland Kitchen CALCIUM PO Take by mouth.  . Multiple Vitamins-Minerals (MULTIVITAMIN WITH MINERALS) tablet Take 1 tablet by mouth daily.  . vitamin C (ASCORBIC ACID) 500 MG tablet Take 500 mg by mouth daily.   No facility-administered encounter medications on file as of 08/30/2017.     Allergies as of 08/30/2017  . (No Known Allergies)    Past Medical History:  Diagnosis Date  . Atypical chest pain    secondary to GERD  . GERD (gastroesophageal reflux disease)   . Head ache    Right sided   . Obesity     Past Surgical History:  Procedure Laterality Date  . ABDOMINAL HYSTERECTOMY     with BSO  . BREAST CYST ASPIRATION      Family History  Problem Relation Age of Onset  . Cancer Mother        breast  . Hypertension Mother   . Breast cancer Mother   . Cancer  Father        colon in early 63's  . Colon cancer Father        early 69's  . Stroke Paternal Grandmother   . Diabetes Paternal Grandmother   . Diabetes Unknown   . Breast cancer Maternal Aunt     Social History   Socioeconomic History  . Marital status: Married    Spouse name: Not on file  . Number of children: 1  . Years of education: Not on file  . Highest education level: Not on file  Occupational History  . Occupation: CNA  Social Needs  . Financial resource strain: Not on file  . Food insecurity:    Worry: Not on file    Inability: Not on file  . Transportation needs:    Medical: Not on file    Non-medical: Not on file  Tobacco Use  . Smoking status: Never Smoker  . Smokeless tobacco: Never Used  Substance and Sexual Activity  . Alcohol use: No    Alcohol/week: 0.0 oz  . Drug use: No  . Sexual activity: Not on file  Lifestyle  . Physical activity:    Days per week: Not on file    Minutes per session: Not on file  . Stress: Not on file  Relationships  . Social connections:    Talks on phone: Not on  file    Gets together: Not on file    Attends religious service: Not on file    Active member of club or organization: Not on file    Attends meetings of clubs or organizations: Not on file    Relationship status: Not on file  . Intimate partner violence:    Fear of current or ex partner: Not on file    Emotionally abused: Not on file    Physically abused: Not on file    Forced sexual activity: Not on file  Other Topics Concern  . Not on file  Social History Narrative   She is a CNA at [employer]    Married for 69 years    46 year old boy    She likes to cook and entertain.       Review of systems: Review of Systems  Constitutional: Negative for fever and chills.  HENT: Negative.   Eyes: Negative for blurred vision.  Respiratory: Negative for cough, shortness of breath and wheezing.   Cardiovascular: Negative for chest pain and palpitations.    Gastrointestinal: as per HPI Genitourinary: Negative for dysuria, urgency, frequency and hematuria.  Musculoskeletal: Negative for myalgias, back pain and joint pain.  Skin: Negative for itching and rash.  Neurological: Negative for dizziness, tremors, focal weakness, seizures and loss of consciousness.  Positive for frequent headaches Endo/Heme/Allergies: Negative for seasonal allergies.  Psychiatric/Behavioral: Negative for depression, suicidal ideas and hallucinations.  All other systems reviewed and are negative.   Physical Exam: Vitals:   08/30/17 1334  BP: 102/74  Pulse: 80   Body mass index is 35.9 kg/m. Gen:      No acute distress HEENT:  EOMI, sclera anicteric Neck:     No masses; no thyromegaly Lungs:    Clear to auscultation bilaterally; normal respiratory effort CV:         Regular rate and rhythm; no murmurs Abd:      + bowel sounds; soft, non-tender; no palpable masses, no distension Ext:    No edema; adequate peripheral perfusion Skin:      Warm and dry; no rash Neuro: alert and oriented x 3 Psych: normal mood and affect  Data Reviewed:  Reviewed labs, radiology imaging, old records and pertinent past GI work up   Assessment and Plan/Recommendations: 47 year old female with family history of colon cancer in her father (his 44s) is here with complaints of small volume blood mixed in stool for the past 7-10 days.  Hemodynamically stable and has no other symptoms. Hemoglobin is slightly lower at 11.5 on August 23, 2017 Will proceed with colonoscopy for evaluation The risks and benefits as well as alternatives of endoscopic procedure(s) have been discussed and reviewed. All questions answered. The patient agrees to proceed.    Damaris Hippo , MD [phone]    CC: Martinique, Betty G, MD

## 2017-08-30 NOTE — Patient Instructions (Addendum)
You have been scheduled for a colonoscopy. Please follow written instructions given to you at your visit today.  Please pick up your prep supplies at the pharmacy within the next 1-3 days. If you use inhalers (even only as needed), please bring them with you on the day of your procedure.  You may have a light breakfast the morning of prep day (the day before the procedure).   You may choose from one of the following items: eggs and toast OR chicken noodle soup and crackers.   You should have your breakfast completed between 8:00 and 9:00 am the day before your procedure.    After you have had your light breakfast you should start a clear liquid diet only, NO SOLIDS. No additional solid food is allowed. You may continue to have clear liquid up to 3 hours prior to your procedure.    If you are age 43 or older, your body mass index should be between 23-30. Your Body mass index is 35.9 kg/m. If this is out of the aforementioned range listed, please consider follow up with your Primary Care Provider.  If you are age 66 or younger, your body mass index should be between 19-25. Your Body mass index is 35.9 kg/m. If this is out of the aformentioned range listed, please consider follow up with your Primary Care Provider.

## 2017-09-10 ENCOUNTER — Ambulatory Visit (AMBULATORY_SURGERY_CENTER): Payer: 59 | Admitting: Gastroenterology

## 2017-09-10 ENCOUNTER — Encounter: Payer: Self-pay | Admitting: Gastroenterology

## 2017-09-10 VITALS — BP 86/48 | HR 67 | Temp 98.6°F | Resp 19 | Ht 61.0 in | Wt 190.0 lb

## 2017-09-10 DIAGNOSIS — Z800 Family history of malignant neoplasm of digestive organs: Secondary | ICD-10-CM | POA: Diagnosis not present

## 2017-09-10 DIAGNOSIS — K625 Hemorrhage of anus and rectum: Secondary | ICD-10-CM

## 2017-09-10 MED ORDER — SODIUM CHLORIDE 0.9 % IV SOLN: 500.0000 mL | Freq: Once | INTRAVENOUS | Status: DC

## 2017-09-10 NOTE — Op Note (Signed)
[facility] Patient Name: Beverly Townsend Procedure Date: 09/10/2017 2:10 PM MRN: [id number] Endoscopist: Mauri Pole , MD Age: 47 Referring MD:  Date of Birth: 12/05/1970 Gender: Female Account #: [id number] Procedure:                Colonoscopy Indications:              Evaluation of unexplained GI bleeding (bright red                            blood per rectum), family history of colon cancer                            in Father in his 4's Medicines:                Monitored Anesthesia Care Procedure:                Pre-Anesthesia Assessment:                           - Prior to the procedure, a History and Physical                            was performed, and patient medications and                            allergies were reviewed. The patient's tolerance of                            previous anesthesia was also reviewed. The risks                            and benefits of the procedure and the sedation                            options and risks were discussed with the patient.                            All questions were answered, and informed consent                            was obtained. Prior Anticoagulants: The patient has                            taken no previous anticoagulant or antiplatelet                            agents. ASA Grade Assessment: II - A patient with                            mild systemic disease. After reviewing the risks                            and benefits, the patient was deemed in  satisfactory condition to undergo the procedure.                           After obtaining informed consent, the colonoscope                            was passed under direct vision. Throughout the                            procedure, the patient's blood pressure, pulse, and                            oxygen saturations were monitored continuously. The                            Colonoscope was introduced  through the anus and                            advanced to the the cecum, identified by                            appendiceal orifice and ileocecal valve. The                            colonoscopy was performed without difficulty. The                            patient tolerated the procedure well. The quality                            of the bowel preparation was excellent. The                            ileocecal valve, appendiceal orifice, and rectum                            were photographed. Scope In: 2:20:08 PM Scope Out: 2:30:37 PM Scope Withdrawal Time: 0 hours 6 minutes 7 seconds  Total Procedure Duration: 0 hours 10 minutes 29 seconds  Findings:                 The perianal and digital rectal examinations were                            normal.                           Non-bleeding internal hemorrhoids were found during                            retroflexion. The hemorrhoids were small.                           The exam was otherwise without abnormality. Complications:            No immediate complications. Estimated Blood Loss:  Estimated blood loss: none. Impression:               - Non-bleeding internal hemorrhoids likely source                            of intermittent bright red blood per rectum                           - The examination was otherwise normal.                           - No specimens collected. Recommendation:           - Patient has a contact number available for                            emergencies. The signs and symptoms of potential                            delayed complications were discussed with the                            patient. Return to normal activities tomorrow.                            Written discharge instructions were provided to the                            patient.                           - Resume previous diet.                           - Continue present medications.                           - Repeat  colonoscopy in 5 years for screening                            purposes. Mauri Pole, MD 09/10/2017 2:34:16 PM This report has been signed electronically.

## 2017-09-10 NOTE — Progress Notes (Signed)
Pt's states no medical or surgical changes since previsit or office visit. 

## 2017-09-10 NOTE — Patient Instructions (Signed)
**  Handout given on office [facility]**   YOU HAD AN ENDOSCOPIC PROCEDURE TODAY: Refer to the procedure report and other information in the discharge instructions given to you for any specific questions about what was found during the examination. If this information does not answer your questions, please call [place] office at [phone] to clarify.   YOU SHOULD EXPECT: Some feelings of bloating in the abdomen. Passage of more gas than usual. Walking can help get rid of the air that was put into your GI tract during the procedure and reduce the bloating. If you had a lower endoscopy (such as a colonoscopy or flexible sigmoidoscopy) you may notice spotting of blood in your stool or on the toilet paper. Some abdominal soreness may be present for a day or two, also.  DIET: Your first meal following the procedure should be a light meal and then it is ok to progress to your normal diet. A half-sandwich or bowl of soup is an example of a good first meal. Heavy or fried foods are harder to digest and may make you feel nauseous or bloated. Drink plenty of fluids but you should avoid alcoholic beverages for 24 hours. If you had a esophageal dilation, please see attached instructions for diet.    ACTIVITY: Your care partner should take you home directly after the procedure. You should plan to take it easy, moving slowly for the rest of the day. You can resume normal activity the day after the procedure however YOU SHOULD NOT DRIVE, use power tools, machinery or perform tasks that involve climbing or major physical exertion for 24 hours (because of the sedation medicines used during the test).   SYMPTOMS TO REPORT IMMEDIATELY: A gastroenterologist can be reached at any hour. Please call [phone]  for any of the following symptoms:  Following lower endoscopy (colonoscopy, flexible sigmoidoscopy) Excessive amounts of blood in the stool  Significant tenderness, worsening of abdominal pains  Swelling of the  abdomen that is new, acute  Fever of 100 or higher    FOLLOW UP:  If any biopsies were taken you will be contacted by phone or by letter within the next 1-3 weeks. Call [phone]  if you have not heard about the biopsies in 3 weeks.  Please also call with any specific questions about appointments or follow up tests.

## 2017-09-10 NOTE — Progress Notes (Signed)
To PACU, VSS. Report to [email or web address] 

## 2017-09-11 ENCOUNTER — Telehealth: Payer: Self-pay

## 2017-09-11 ENCOUNTER — Ambulatory Visit: Payer: Self-pay

## 2017-09-11 NOTE — Telephone Encounter (Signed)
  Follow up Call-  Call back number 09/10/2017 05/18/2015  Post procedure Call Back phone  # [phone]  Permission to leave phone message Yes Yes  Some recent data might be hidden     No ID on machine.  No message left. Mordche Hedglin/Call-back

## 2017-09-11 NOTE — Telephone Encounter (Signed)
Left message

## 2017-10-01 ENCOUNTER — Ambulatory Visit
Admission: RE | Admit: 2017-10-01 | Discharge: 2017-10-01 | Disposition: A | Discharge status: Home / Self Care | Payer: 59 | Source: Ambulatory Visit | Attending: Adult Health | Admitting: Adult Health

## 2017-10-01 DIAGNOSIS — Z1231 Encounter for screening mammogram for malignant neoplasm of breast: Secondary | ICD-10-CM | POA: Diagnosis not present

## 2017-10-10 ENCOUNTER — Encounter: Payer: Self-pay | Admitting: Gastroenterology

## 2017-12-04 DIAGNOSIS — H52223 Regular astigmatism, bilateral: Secondary | ICD-10-CM | POA: Diagnosis not present

## 2017-12-04 DIAGNOSIS — H524 Presbyopia: Secondary | ICD-10-CM | POA: Diagnosis not present

## 2017-12-04 DIAGNOSIS — H5203 Hypermetropia, bilateral: Secondary | ICD-10-CM | POA: Diagnosis not present

## 2017-12-17 ENCOUNTER — Encounter

## 2017-12-17 ENCOUNTER — Encounter: Payer: Self-pay | Admitting: Gastroenterology

## 2018-07-21 ENCOUNTER — Other Ambulatory Visit: Payer: Self-pay | Admitting: Gastroenterology

## 2018-07-21 ENCOUNTER — Other Ambulatory Visit: Payer: Self-pay | Admitting: Adult Health

## 2018-07-21 DIAGNOSIS — Z1231 Encounter for screening mammogram for malignant neoplasm of breast: Secondary | ICD-10-CM

## 2018-08-20 ENCOUNTER — Telehealth: Payer: Self-pay | Admitting: *Deleted

## 2018-08-20 NOTE — Telephone Encounter (Signed)
L/M FOR PT TO CHANGE APPOINTMENT TO WEBEX IF AGREES

## 2018-08-22 NOTE — Telephone Encounter (Signed)
I have spoken to patient and confirmed webex visit. Link sent.

## 2018-08-27 ENCOUNTER — Encounter

## 2018-08-27 ENCOUNTER — Other Ambulatory Visit: Payer: Self-pay

## 2018-08-27 ENCOUNTER — Encounter: Payer: Self-pay | Admitting: Gastroenterology

## 2018-08-27 ENCOUNTER — Ambulatory Visit (INDEPENDENT_AMBULATORY_CARE_PROVIDER_SITE_OTHER): Payer: Self-pay | Admitting: Gastroenterology

## 2018-08-27 DIAGNOSIS — R1310 Dysphagia, unspecified: Secondary | ICD-10-CM

## 2018-08-27 DIAGNOSIS — K219 Gastro-esophageal reflux disease without esophagitis: Secondary | ICD-10-CM

## 2018-08-27 MED ORDER — OMEPRAZOLE 20 MG PO CPDR: DELAYED_RELEASE_CAPSULE | ORAL | 3 refills | Status: DC

## 2018-08-27 NOTE — Progress Notes (Signed)
Beverly Townsend    [id number]    May 27, 1970  Primary Care Physician:Jordan, Malka So, MD  Referring Physician: Martinique, Betty G, MD [place], [place] [address]  This service was provided via telemedicine due to [place] 19.  Patient location: Home Provider location: Office Used 2 patient identifiers to confirm the correct person. Explained the limitations in evaluation and management via telemedicine. Patient is aware of potential medical charges for this visit.  Patient consented to this virtual visit (via telephone/webex).  The persons participating in this telemedicine service were myself and the patient   Chief complaint: Dysphagia HPI:  48 year old female with history of rectal bleeding secondary to internal hemorrhoids, last seen in April 2019 with complaints of intermittent dysphagia. She started noticing 2 months ago intermittent episodes of food getting hung up in her throat and mid chest, she has to stand up and a tap on her chest in order to get it go down.  Denies any regurgitation or vomiting .  She has had episodes with both solids and liquids.  She is having an episode approximately once a week or every other week, does not happen with every meal. Denies any decreased appetite or weight loss  She has been taking baclofen for past 2 months for headaches and muscle spasm, stopped taking it frequently in the past few weeks  BM every other day, no straining No pain and no bleeding  Review of system positive for STD, her partner is currently getting treated.  She took some of his medication but her symptoms are not improving     Outpatient Encounter Medications as of 08/27/2018  Medication Sig  . baclofen (LIORESAL) 10 MG tablet Take 1 tablet (10 mg total) by mouth 3 (three) times daily as needed for muscle spasms.  Marland Kitchen CALCIUM PO Take by mouth.  . Multiple Vitamins-Minerals (MULTIVITAMIN WITH MINERALS) tablet Take 1 tablet by mouth  daily.  . vitamin C (ASCORBIC ACID) 500 MG tablet Take 500 mg by mouth daily.   Facility-Administered Encounter Medications as of 08/27/2018  Medication  . 0.9 %  sodium chloride infusion    Allergies as of 08/27/2018  . (No Known Allergies)    Past Medical History:  Diagnosis Date  . Atypical chest pain    secondary to GERD  . GERD (gastroesophageal reflux disease)   . Head ache    Right sided   . Obesity     Past Surgical History:  Procedure Laterality Date  . ABDOMINAL HYSTERECTOMY     with BSO  . BREAST CYST ASPIRATION      Family History  Problem Relation Age of Onset  . Cancer Mother        breast  . Hypertension Mother   . Breast cancer Mother   . Cancer Father        colon in early 81's  . Colon cancer Father        early 51's  . Stroke Paternal Grandmother   . Diabetes Paternal Grandmother   . Diabetes Unknown   . Breast cancer Maternal Aunt     Social History   Socioeconomic History  . Marital status: Married    Spouse name: Not on file  . Number of children: 1  . Years of education: Not on file  . Highest education level: Not on file  Occupational History  . Occupation: CNA  Social Needs  . Financial resource strain: Not on  file  . Food insecurity:    Worry: Not on file    Inability: Not on file  . Transportation needs:    Medical: Not on file    Non-medical: Not on file  Tobacco Use  . Smoking status: Never Smoker  . Smokeless tobacco: Never Used  Substance and Sexual Activity  . Alcohol use: No    Alcohol/week: 0.0 standard drinks  . Drug use: No  . Sexual activity: Not on file  Lifestyle  . Physical activity:    Days per week: Not on file    Minutes per session: Not on file  . Stress: Not on file  Relationships  . Social connections:    Talks on phone: Not on file    Gets together: Not on file    Attends religious service: Not on file    Active member of club or organization: Not on file    Attends meetings of clubs or  organizations: Not on file    Relationship status: Not on file  . Intimate partner violence:    Fear of current or ex partner: Not on file    Emotionally abused: Not on file    Physically abused: Not on file    Forced sexual activity: Not on file  Other Topics Concern  . Not on file  Social History Narrative   She is a CNA at [employer]    Married for 3 years    47 year old boy    She likes to cook and entertain.       Review of systems: Review of Systems as per HPI All other systems reviewed and are negative.   Physical Exam: Vitals were not taken and physical exam was not performed during this virtual visit.  Data Reviewed:  Reviewed labs, radiology imaging, old records and pertinent past GI work up   Assessment and Plan/Recommendations:  48 year old female with intermittent dysphagia to both solids and liquids  Dysphagia could be secondary to esophageal dysmotility due to baclofen, cannot exclude esophageal stricture or spasm due to uncontrolled GERD  Discontinue baclofen  Omeprazole 20 mg daily  Antireflux measures  Plan for EGD in 6 to 8 weeks to evaluate possible etiology of dysphagia, exclude mechanical stricture, severe esophagitis or neoplastic lesion.  Have to delay the procedure due to COVID-19. The risks and benefits as well as alternatives of endoscopic procedure(s) have been discussed and reviewed. All questions answered. The patient agrees to proceed.   Advised patient to call with any worsening or change in symptoms.   Damaris Hippo , MD   CC: Martinique, Betty G, MD

## 2018-08-27 NOTE — Patient Instructions (Addendum)
If you are age 47 or older, your body mass index should be between 23-30. Your There is no height or weight on file to calculate BMI. If this is out of the aforementioned range listed, please consider follow up with your Primary Care Provider.  If you are age 43 or younger, your body mass index should be between 19-25. Your There is no height or weight on file to calculate BMI. If this is out of the aformentioned range listed, please consider follow up with your Primary Care Provider.   We have sent the following medications to your pharmacy for you to pick up at your convenience: Omeprazole    Start Omeprazole 20mg  daily, 30 minutes before breakfast  Antireflux measures  Discontinue Baclofen  Small bites and chew food well, avoid tough meat and dense bread  Schedule for EGD in 6-8 weeks, will need to delay procedure due to COVID 19  Thank you for choosing me and [facility].  Dr. Silverio Decamp

## 2018-09-29 ENCOUNTER — Telehealth: Payer: Self-pay | Admitting: *Deleted

## 2018-09-29 NOTE — Telephone Encounter (Signed)
L/M for patient to return my call to schedule her EGD for dysphagia for the 19th of May

## 2018-10-03 ENCOUNTER — Ambulatory Visit: Payer: Self-pay

## 2018-10-05 ENCOUNTER — Telehealth: Payer: Self-pay | Admitting: *Deleted

## 2018-10-05 NOTE — Telephone Encounter (Signed)
Attempted covid screen. Left VM

## 2018-10-06 ENCOUNTER — Telehealth: Payer: Self-pay | Admitting: *Deleted

## 2018-10-06 ENCOUNTER — Ambulatory Visit: Payer: Self-pay | Admitting: *Deleted

## 2018-10-06 ENCOUNTER — Other Ambulatory Visit: Payer: Self-pay

## 2018-10-06 ENCOUNTER — Encounter: Payer: Self-pay | Admitting: Gastroenterology

## 2018-10-06 VITALS — Ht 61.0 in | Wt 190.0 lb

## 2018-10-06 DIAGNOSIS — R1310 Dysphagia, unspecified: Secondary | ICD-10-CM

## 2018-10-06 NOTE — Progress Notes (Signed)
No egg or soy allergy known to patient  No issues with past sedation with any surgeries  or procedures, no intubation problems  No diet pills per patient No home 02 use per patient  No blood thinners per patient  Pt denies issues with constipation  No A fib or A flutter  EMMI video sent to pt's e mail    Pt sent  instructions via My Chart as her procedure is tomorrow 5-19-- . PV completed over the phone. Pt encouraged to call with questions or issues - pt will have to sign pre procedure acknowledge ment paper in admitting tomorrow when she gets here

## 2018-10-06 NOTE — Telephone Encounter (Signed)
Covid-19 travel screening questions  Have you traveled in the last 14 days?no If yes where?  Do you now or have you had a fever in the last 14 days?no  Do you have any respiratory symptoms of shortness of breath or cough now or in the last 14 days?no  Do you have a medical history of Congestive Heart Failure?  Do you have a medical history of lung disease?  Do you have any family members or close contacts with diagnosed or suspected Covid-19?no  Pt made aware of care partner policy and will bring a mask with her if available. SM

## 2018-10-07 ENCOUNTER — Encounter: Payer: Self-pay | Admitting: Gastroenterology

## 2018-10-07 ENCOUNTER — Ambulatory Visit (AMBULATORY_SURGERY_CENTER): Payer: Commercial Managed Care - PPO | Admitting: Gastroenterology

## 2018-10-07 VITALS — BP 98/50 | HR 69 | Temp 97.8°F | Resp 24 | Ht 61.0 in | Wt 190.0 lb

## 2018-10-07 DIAGNOSIS — K210 Gastro-esophageal reflux disease with esophagitis: Secondary | ICD-10-CM | POA: Diagnosis not present

## 2018-10-07 DIAGNOSIS — K3189 Other diseases of stomach and duodenum: Secondary | ICD-10-CM

## 2018-10-07 DIAGNOSIS — K2970 Gastritis, unspecified, without bleeding: Secondary | ICD-10-CM | POA: Diagnosis not present

## 2018-10-07 DIAGNOSIS — K222 Esophageal obstruction: Secondary | ICD-10-CM | POA: Diagnosis not present

## 2018-10-07 DIAGNOSIS — R1310 Dysphagia, unspecified: Secondary | ICD-10-CM | POA: Diagnosis not present

## 2018-10-07 DIAGNOSIS — K2990 Gastroduodenitis, unspecified, without bleeding: Secondary | ICD-10-CM

## 2018-10-07 MED ORDER — SODIUM CHLORIDE 0.9 % IV SOLN: 500.0000 mL | Freq: Once | INTRAVENOUS | Status: DC

## 2018-10-07 MED ORDER — ESOMEPRAZOLE MAGNESIUM 40 MG PO PACK: 40.0000 mg | PACK | Freq: Every day | ORAL | 12 refills | Status: DC

## 2018-10-07 NOTE — Op Note (Signed)
[facility] Patient Name: Beverly Townsend Procedure Date: 10/07/2018 10:07 AM MRN: [id number] Endoscopist: Mauri Pole , MD Age: 48 Referring MD:  Date of Birth: [birth year] Gender: Female Account #: [id number] Procedure:                Upper GI endoscopy Indications:              Dysphagia Medicines:                Monitored Anesthesia Care Procedure:                Pre-Anesthesia Assessment:                           - Prior to the procedure, a History and Physical                            was performed, and patient medications and                            allergies were reviewed. The patient's tolerance of                            previous anesthesia was also reviewed. The risks                            and benefits of the procedure and the sedation                            options and risks were discussed with the patient.                            All questions were answered, and informed consent                            was obtained. Prior Anticoagulants: The patient has                            taken no previous anticoagulant or antiplatelet                            agents. ASA Grade Assessment: II - A patient with                            mild systemic disease. After reviewing the risks                            and benefits, the patient was deemed in                            satisfactory condition to undergo the procedure.                           After obtaining informed consent, the endoscope was  passed under direct vision. Throughout the                            procedure, the patient's blood pressure, pulse, and                            oxygen saturations were monitored continuously. The                            Model GIF-[id number] scope was introduced                            through the mouth, and advanced to the second part                            of duodenum. The upper GI endoscopy  was                            accomplished without difficulty. The patient                            tolerated the procedure well. Scope In: Scope Out: Findings:                 LA Grade B (one or more mucosal breaks greater than                            5 mm, not extending between the tops of two mucosal                            folds) esophagitis with no bleeding was found 34 to                            36 cm from the incisors.                           One benign-appearing, intrinsic moderate stenosis                            was found 34 to 35 cm from the incisors. This                            stenosis measured less than one cm (in length). The                            stenosis was traversed. A TTS dilator was passed                            through the scope. Dilation with a 16-17-18 mm                            balloon dilator was performed to 18 mm. The  dilation site was examined and showed moderate                            mucosal disruption.                           Few non-bleeding cratered gastric ulcers with no                            stigmata of bleeding were found in the gastric                            antrum, in the prepyloric region of the stomach and                            at the pylorus. The largest lesion was 5 mm in                            largest dimension. Biopsies were taken with a cold                            forceps for Helicobacter pylori testing.                           Three non-bleeding cratered duodenal ulcers with no                            stigmata of bleeding were found in the duodenal                            bulb. The largest lesion was 3 mm in largest                            dimension.                           The second portion of the duodenum was normal. Complications:            No immediate complications. Estimated Blood Loss:     Estimated blood loss was  minimal. Impression:               - LA Grade B reflux esophagitis.                           - Benign-appearing esophageal stenosis. Dilated.                           - Non-bleeding gastric ulcers with no stigmata of                            bleeding. Biopsied.                           - Non-bleeding duodenal ulcers with no stigmata of  bleeding.                           - Normal second portion of the duodenum. Recommendation:           - Patient has a contact number available for                            emergencies. The signs and symptoms of potential                            delayed complications were discussed with the                            patient. Return to normal activities tomorrow.                            Written discharge instructions were provided to the                            patient.                           - Resume previous diet.                           - Continue present medications.                           - Await pathology results.                           - Return to my office in 1 month, telemedicine. Mauri Pole, MD 10/07/2018 10:37:58 AM This report has been signed electronically.

## 2018-10-07 NOTE — Progress Notes (Signed)
Called to room to assist during endoscopic procedure.  Patient ID and intended procedure confirmed with present staff. Received instructions for my participation in the procedure from the performing physician.  

## 2018-10-07 NOTE — Progress Notes (Signed)
PT taken to PACU. Monitors in place. VSS. Report given to RN. 

## 2018-10-07 NOTE — Patient Instructions (Signed)
Handout given for Ulcers and soft diet.  YOU HAD AN ENDOSCOPIC PROCEDURE TODAY AT [facility]:   Refer to the procedure report that was given to you for any specific questions about what was found during the examination.  If the procedure report does not answer your questions, please call your gastroenterologist to clarify.  If you requested that your care partner not be given the details of your procedure findings, then the procedure report has been included in a sealed envelope for you to review at your convenience later.  YOU SHOULD EXPECT: Some feelings of bloating in the abdomen. Passage of more gas than usual.  Walking can help get rid of the air that was put into your GI tract during the procedure and reduce the bloating. If you had a lower endoscopy (such as a colonoscopy or flexible sigmoidoscopy) you may notice spotting of blood in your stool or on the toilet paper. If you underwent a bowel prep for your procedure, you may not have a normal bowel movement for a few days.  Please Note:  You might notice some irritation and congestion in your nose or some drainage.  This is from the oxygen used during your procedure.  There is no need for concern and it should clear up in a day or so.  SYMPTOMS TO REPORT IMMEDIATELY:    Following upper endoscopy (EGD)  Vomiting of blood or coffee ground material  New chest pain or pain under the shoulder blades  Painful or persistently difficult swallowing  New shortness of breath  Fever of 100F or higher  Black, tarry-looking stools  For urgent or emergent issues, a gastroenterologist can be reached at any hour by calling [phone].   DIET:  We do recommend a small meal at first, but then you may proceed to your regular diet.  Drink plenty of fluids but you should avoid alcoholic beverages for 24 hours.  ACTIVITY:  You should plan to take it easy for the rest of today and you should NOT DRIVE or use heavy machinery until  tomorrow (because of the sedation medicines used during the test).    FOLLOW UP: Our staff will call the number listed on your records 48-72 hours following your procedure to check on you and address any questions or concerns that you may have regarding the information given to you following your procedure. If we do not reach you, we will leave a message.  We will attempt to reach you two times.  During this call, we will ask if you have developed any symptoms of COVID 19. If you develop any symptoms (for example fever, flu-like symptoms, shortness of breath, cough etc.) before then, please call [phone].  If any biopsies were taken you will be contacted by phone or by letter within the next 1-3 weeks.  Please call us at [phone] if you have not heard about the biopsies in 3 weeks.    SIGNATURES/CONFIDENTIALITY: You and/or your care partner have signed paperwork which will be entered into your electronic medical record.  These signatures attest to the fact that that the information above on your After Visit Summary has been reviewed and is understood.  Full responsibility of the confidentiality of this discharge information lies with you and/or your care-partner.

## 2018-10-07 NOTE — Progress Notes (Signed)
covid screen and temp per Loel Ro, LPN

## 2018-10-09 ENCOUNTER — Telehealth: Payer: Self-pay

## 2018-10-09 NOTE — Telephone Encounter (Signed)
  Follow up Call-  Call back number 10/07/2018 09/10/2017  Post procedure Call Back phone  # [phone]  Permission to leave phone message Yes Yes  Some recent data might be hidden     Patient questions:  Do you have a fever, pain , or abdominal swelling? No. Pain Score  0 *  Have you tolerated food without any problems? Yes.    Have you been able to return to your normal activities? Yes.    Do you have any questions about your discharge instructions: Diet   No. Medications  No. Follow up visit  No.  Do you have questions or concerns about your Care? No.  Actions: * If pain score is 4 or above: No action needed, pain <4.   1. Have you developed a fever since your procedure? No.  2.   Have you had an respiratory symptoms (SOB or cough) since your procedure? No.  3.   Have you tested positive for COVID 19 since your procedure No.  3.   Have you had any family members/close contacts diagnosed with the COVID 19 since your procedure?  No   If any of these questions are a yes, please inquire if patient has been seen by family doctor and route this note to Joylene John, Therapist, sports.

## 2018-10-10 NOTE — Telephone Encounter (Signed)
Patient had EGD on May 19th

## 2018-10-15 ENCOUNTER — Encounter: Payer: Self-pay | Admitting: Family Medicine

## 2018-10-15 ENCOUNTER — Other Ambulatory Visit: Payer: Self-pay

## 2018-10-15 ENCOUNTER — Ambulatory Visit (INDEPENDENT_AMBULATORY_CARE_PROVIDER_SITE_OTHER): Payer: Commercial Managed Care - PPO | Admitting: Family Medicine

## 2018-10-15 ENCOUNTER — Encounter: Payer: Self-pay | Admitting: Gastroenterology

## 2018-10-15 DIAGNOSIS — A6000 Herpesviral infection of urogenital system, unspecified: Secondary | ICD-10-CM | POA: Diagnosis not present

## 2018-10-15 MED ORDER — VALACYCLOVIR HCL 1 G PO TABS: ORAL_TABLET | ORAL | 0 refills | Status: DC

## 2018-10-15 NOTE — Progress Notes (Signed)
Virtual Visit via Video Note   I tried to connect with [name] on 10/15/18 at 12:15 PM EDT by a video enabled telemedicine application,unsuccessful;so changed to telephone visit. I verified that I am speaking with the correct person using two identifiers.  Location patient: home Location provider:home office Persons participating in the virtual visit: patient, provider  I discussed the limitations of evaluation and management by telemedicine and the availability of in person appointments. The patient expressed understanding and agreed to proceed.   HPI:  [name] is a 48 yo female c/o 2 days of "little burning" sensation with urination. Denies increased urinary frequency, gross hematuria,or decreased urine output. She noted sore lesion on left perineal area,very tender "blister." Problem is stable.  She has had a similar episode once,resoved spontaneously. Sex partner has Hx of HSV 2. She has pos HSV 2 ab.  She denies fever,chills,abdominal pain,nausea,vomiting,vaginal bleeding or discharge.  She has not tried OTC treatments.  ROS: See pertinent positives and negatives per HPI.  Past Medical History:  Diagnosis Date  . Atypical chest pain    secondary to GERD  . GERD (gastroesophageal reflux disease)   . Head ache    Right sided   . Obesity     Past Surgical History:  Procedure Laterality Date  . ABDOMINAL HYSTERECTOMY     with BSO  . BREAST CYST ASPIRATION      Family History  Problem Relation Age of Onset  . Cancer Mother        breast  . Hypertension Mother   . Breast cancer Mother   . Cancer Father        colon in early 71's  . Colon cancer Father        early 32's  . Stroke Paternal Grandmother   . Diabetes Paternal Grandmother   . Diabetes Other   . Breast cancer Maternal Aunt   . Stomach cancer Maternal Grandfather   . Colon polyps Neg Hx   . Esophageal cancer Neg Hx   . Rectal cancer Neg Hx     Social History   Socioeconomic History  .  Marital status: Married    Spouse name: Not on file  . Number of children: 1  . Years of education: Not on file  . Highest education level: Not on file  Occupational History  . Occupation: CNA  Social Needs  . Financial resource strain: Not on file  . Food insecurity:    Worry: Not on file    Inability: Not on file  . Transportation needs:    Medical: Not on file    Non-medical: Not on file  Tobacco Use  . Smoking status: Never Smoker  . Smokeless tobacco: Never Used  Substance and Sexual Activity  . Alcohol use: No    Alcohol/week: 0.0 standard drinks  . Drug use: No  . Sexual activity: Not on file  Lifestyle  . Physical activity:    Days per week: Not on file    Minutes per session: Not on file  . Stress: Not on file  Relationships  . Social connections:    Talks on phone: Not on file    Gets together: Not on file    Attends religious service: Not on file    Active member of club or organization: Not on file    Attends meetings of clubs or organizations: Not on file    Relationship status: Not on file  . Intimate partner violence:    Fear of  current or ex partner: Not on file    Emotionally abused: Not on file    Physically abused: Not on file    Forced sexual activity: Not on file  Other Topics Concern  . Not on file  Social History Narrative   She is a CNA at [employer]    Married for 16 years    47 year old boy    She likes to cook and entertain.      Current Outpatient Medications:  .  baclofen (LIORESAL) 10 MG tablet, Take 1 tablet (10 mg total) by mouth 3 (three) times daily as needed for muscle spasms. (Patient not taking: Reported on 10/06/2018), Disp: 30 each, Rfl: 1 .  CALCIUM PO, Take by mouth., Disp: , Rfl:  .  Cyanocobalamin (VITAMIN B 12 PO), Take by mouth daily., Disp: , Rfl:  .  esomeprazole (NEXIUM) 40 MG packet, Take 40 mg by mouth daily before breakfast., Disp: 30 each, Rfl: 12 .  Multiple Vitamins-Minerals (MULTIVITAMIN WITH MINERALS)  tablet, Take 1 tablet by mouth daily., Disp: , Rfl:  .  Multiple Vitamins-Minerals (ZINC PO), Take by mouth daily., Disp: , Rfl:  .  omeprazole (PRILOSEC) 20 MG capsule, Take once daily 30 min before breakfast., Disp: 90 capsule, Rfl: 3 .  valACYclovir (VALTREX) 1000 MG tablet, 1 tab daily for 5 days. Repeat treatment for future lesions within 48 hours and as needed., Disp: 20 tablet, Rfl: 0 .  vitamin C (ASCORBIC ACID) 500 MG tablet, Take 500 mg by mouth daily., Disp: , Rfl:   EXAM:  VITALS per patient if applicable:N/A  GENERAL: alert, oriented, appears well and in no acute distress  LUNGS: No signs of respiratory distress, breathing rate appears normal, no obvious gross SOB, gasping or wheezing  CV: no obvious cyanosis  PSYCH/NEURO: pleasant and cooperative, no obvious depression or anxiety, speech and thought processing grossly intact  ASSESSMENT AND PLAN:  Discussed the following assessment and plan:  Recurrent genital herpes simplex type 2 infection - Plan: valACYclovir (VALTREX) 1000 MG tablet  Hx suggest HSV infection. Educated about Dx and prognosis. Valtrex 1000 mg x 5 days then as needed for future flare ups. Instructed about warning signs. F/U as needed.  I provided 13 minutes of non-face-to-face time during this encounter.   I discussed the assessment and treatment plan with the patient. She was provided an opportunity to ask questions and all were answered. The patient agreed with the plan and demonstrated an understanding of the instructions.   The patient was advised to call back or seek an in-person evaluation if the symptoms worsen or if the condition fails to improve as anticipated.    Return if symptoms worsen or fail to improve.    Betty Martinique, MD

## 2018-11-06 ENCOUNTER — Ambulatory Visit: Payer: Self-pay

## 2019-02-14 ENCOUNTER — Other Ambulatory Visit: Payer: Self-pay | Admitting: Family Medicine

## 2019-02-14 DIAGNOSIS — A6000 Herpesviral infection of urogenital system, unspecified: Secondary | ICD-10-CM

## 2019-03-25 ENCOUNTER — Ambulatory Visit: Payer: Commercial Managed Care - PPO

## 2020-01-15 ENCOUNTER — Other Ambulatory Visit: Payer: Self-pay | Admitting: Family Medicine

## 2020-01-15 DIAGNOSIS — A6000 Herpesviral infection of urogenital system, unspecified: Secondary | ICD-10-CM

## 2020-02-02 ENCOUNTER — Inpatient Hospital Stay (HOSPITAL_COMMUNITY): Payer: HRSA Program

## 2020-02-02 ENCOUNTER — Emergency Department (HOSPITAL_COMMUNITY): Payer: HRSA Program

## 2020-02-02 ENCOUNTER — Encounter (HOSPITAL_COMMUNITY): Payer: Self-pay | Admitting: *Deleted

## 2020-02-02 ENCOUNTER — Inpatient Hospital Stay (HOSPITAL_COMMUNITY)
Admission: EM | Admit: 2020-02-02 | Discharge: 2020-02-04 | DRG: 177 | Disposition: A | Discharge status: Home / Self Care | Payer: HRSA Program | Attending: Internal Medicine | Admitting: Internal Medicine

## 2020-02-02 ENCOUNTER — Other Ambulatory Visit: Payer: Self-pay

## 2020-02-02 DIAGNOSIS — Z6836 Body mass index (BMI) 36.0-36.9, adult: Secondary | ICD-10-CM

## 2020-02-02 DIAGNOSIS — E669 Obesity, unspecified: Secondary | ICD-10-CM | POA: Diagnosis present

## 2020-02-02 DIAGNOSIS — I2699 Other pulmonary embolism without acute cor pulmonale: Secondary | ICD-10-CM | POA: Diagnosis present

## 2020-02-02 DIAGNOSIS — Z6835 Body mass index (BMI) 35.0-35.9, adult: Secondary | ICD-10-CM | POA: Diagnosis not present

## 2020-02-02 DIAGNOSIS — R7302 Impaired glucose tolerance (oral): Secondary | ICD-10-CM | POA: Diagnosis present

## 2020-02-02 DIAGNOSIS — U071 COVID-19: Principal | ICD-10-CM | POA: Diagnosis present

## 2020-02-02 DIAGNOSIS — Z90722 Acquired absence of ovaries, bilateral: Secondary | ICD-10-CM

## 2020-02-02 DIAGNOSIS — Z79899 Other long term (current) drug therapy: Secondary | ICD-10-CM

## 2020-02-02 DIAGNOSIS — J9601 Acute respiratory failure with hypoxia: Secondary | ICD-10-CM | POA: Diagnosis present

## 2020-02-02 DIAGNOSIS — Z800 Family history of malignant neoplasm of digestive organs: Secondary | ICD-10-CM

## 2020-02-02 DIAGNOSIS — I361 Nonrheumatic tricuspid (valve) insufficiency: Secondary | ICD-10-CM

## 2020-02-02 DIAGNOSIS — K219 Gastro-esophageal reflux disease without esophagitis: Secondary | ICD-10-CM | POA: Diagnosis present

## 2020-02-02 DIAGNOSIS — E8809 Other disorders of plasma-protein metabolism, not elsewhere classified: Secondary | ICD-10-CM | POA: Diagnosis not present

## 2020-02-02 DIAGNOSIS — Z90710 Acquired absence of both cervix and uterus: Secondary | ICD-10-CM

## 2020-02-02 DIAGNOSIS — Z9079 Acquired absence of other genital organ(s): Secondary | ICD-10-CM

## 2020-02-02 DIAGNOSIS — E876 Hypokalemia: Secondary | ICD-10-CM | POA: Diagnosis present

## 2020-02-02 DIAGNOSIS — R0902 Hypoxemia: Secondary | ICD-10-CM

## 2020-02-02 DIAGNOSIS — D696 Thrombocytopenia, unspecified: Secondary | ICD-10-CM | POA: Diagnosis present

## 2020-02-02 DIAGNOSIS — J1282 Pneumonia due to coronavirus disease 2019: Secondary | ICD-10-CM | POA: Diagnosis present

## 2020-02-02 DIAGNOSIS — R9431 Abnormal electrocardiogram [ECG] [EKG]: Secondary | ICD-10-CM | POA: Diagnosis not present

## 2020-02-02 DIAGNOSIS — R0602 Shortness of breath: Secondary | ICD-10-CM | POA: Diagnosis present

## 2020-02-02 LAB — LACTIC ACID, PLASMA: Lactic Acid, Venous: 1.4 mmol/L (ref 0.5–1.9)

## 2020-02-02 LAB — COMPREHENSIVE METABOLIC PANEL
ALT: 50 U/L — ABNORMAL HIGH (ref 0–44)
AST: 40 U/L (ref 15–41)
Albumin: 2.9 g/dL — ABNORMAL LOW (ref 3.5–5.0)
Alkaline Phosphatase: 59 U/L (ref 38–126)
Anion gap: 13 (ref 5–15)
BUN: 10 mg/dL (ref 6–20)
CO2: 25 mmol/L (ref 22–32)
Calcium: 8.5 mg/dL — ABNORMAL LOW (ref 8.9–10.3)
Chloride: 96 mmol/L — ABNORMAL LOW (ref 98–111)
Creatinine, Ser: 0.57 mg/dL (ref 0.44–1.00)
GFR calc Af Amer: >60 mL/min (ref >60)
GFR calc non Af Amer: >60 mL/min (ref >60)
Glucose, Bld: 109 mg/dL — ABNORMAL HIGH (ref 70–99)
Potassium: 3.4 mmol/L — ABNORMAL LOW (ref 3.5–5.1)
Sodium: 134 mmol/L — ABNORMAL LOW (ref 135–145)
Total Bilirubin: 0.9 mg/dL (ref 0.3–1.2)
Total Protein: 7.5 g/dL (ref 6.5–8.1)

## 2020-02-02 LAB — CBC WITH DIFFERENTIAL/PLATELET
Abs Immature Granulocytes: 0.09 10*3/uL — ABNORMAL HIGH (ref 0.00–0.07)
Basophils Absolute: 0 10*3/uL (ref 0.0–0.1)
Basophils Relative: 0 %
Eosinophils Absolute: 0.1 10*3/uL (ref 0.0–0.5)
Eosinophils Relative: 0 %
HCT: 36.8 % (ref 36.0–46.0)
Hemoglobin: 11.8 g/dL — ABNORMAL LOW (ref 12.0–15.0)
Immature Granulocytes: 1 %
Lymphocytes Relative: 11 %
Lymphs Abs: 1.3 10*3/uL (ref 0.7–4.0)
MCH: 31 pg (ref 26.0–34.0)
MCHC: 32.1 g/dL (ref 30.0–36.0)
MCV: 96.6 fL (ref 80.0–100.0)
Monocytes Absolute: 0.7 10*3/uL (ref 0.1–1.0)
Monocytes Relative: 6 %
Neutro Abs: 9.5 10*3/uL — ABNORMAL HIGH (ref 1.7–7.7)
Neutrophils Relative %: 82 %
Platelets: 421 10*3/uL — ABNORMAL HIGH (ref 150–400)
RBC: 3.81 MIL/uL — ABNORMAL LOW (ref 3.87–5.11)
RDW: 13.2 % (ref 11.5–15.5)
WBC: 11.6 10*3/uL — ABNORMAL HIGH (ref 4.0–10.5)
nRBC: 0 % (ref 0.0–0.2)

## 2020-02-02 LAB — ECHOCARDIOGRAM COMPLETE
AR max vel: 1.99 cm2
AV Area VTI: 1.73 cm2
AV Area mean vel: 1.73 cm2
AV Mean grad: 4.7 mmHg
AV Peak grad: 8.2 mmHg
Ao pk vel: 1.43 m/s
Area-P 1/2: 3.93 cm2
Height: 61 in
S' Lateral: 2.42 cm
Weight: 3008 oz

## 2020-02-02 LAB — PROCALCITONIN: Procalcitonin: 0.16 ng/mL

## 2020-02-02 LAB — HEPARIN LEVEL (UNFRACTIONATED): Heparin Unfractionated: 0.29 IU/mL — ABNORMAL LOW (ref 0.30–0.70)

## 2020-02-02 LAB — FERRITIN: Ferritin: 1133 ng/mL — ABNORMAL HIGH (ref 11–307)

## 2020-02-02 LAB — LACTATE DEHYDROGENASE: LDH: 389 U/L — ABNORMAL HIGH (ref 98–192)

## 2020-02-02 LAB — C-REACTIVE PROTEIN: CRP: 24.8 mg/dL — ABNORMAL HIGH (ref <1.0)

## 2020-02-02 LAB — HIV ANTIBODY (ROUTINE TESTING W REFLEX): HIV Screen 4th Generation wRfx: NONREACTIVE

## 2020-02-02 LAB — FIBRINOGEN: Fibrinogen: >800 mg/dL — ABNORMAL HIGH (ref 210–475)

## 2020-02-02 LAB — SARS CORONAVIRUS 2 BY RT PCR (HOSPITAL ORDER, PERFORMED IN ~~LOC~~ HOSPITAL LAB): SARS Coronavirus 2: POSITIVE — AB

## 2020-02-02 LAB — D-DIMER, QUANTITATIVE: D-Dimer, Quant: 3.15 ug/mL-FEU — ABNORMAL HIGH (ref 0.00–0.50)

## 2020-02-02 LAB — TRIGLYCERIDES: Triglycerides: 95 mg/dL (ref <150)

## 2020-02-02 MED ORDER — SODIUM CHLORIDE 0.9 % IV SOLN
100.0000 mg | INTRAVENOUS | Status: AC
  Administered 2020-02-02 (×2): 100 mg via INTRAVENOUS
  Filled 2020-02-02 (×2): qty 20

## 2020-02-02 MED ORDER — METHYLPREDNISOLONE SODIUM SUCC 125 MG IJ SOLR
80.0000 mg | Freq: Two times a day (BID) | INTRAMUSCULAR | Status: DC
  Administered 2020-02-02 – 2020-02-04 (×5): 80 mg via INTRAVENOUS
  Filled 2020-02-02 (×6): qty 2

## 2020-02-02 MED ORDER — POTASSIUM CHLORIDE CRYS ER 20 MEQ PO TBCR
40.0000 meq | EXTENDED_RELEASE_TABLET | Freq: Once | ORAL | Status: AC
  Administered 2020-02-02: 40 meq via ORAL
  Filled 2020-02-02: qty 2

## 2020-02-02 MED ORDER — ACETAMINOPHEN 325 MG PO TABS
650.0000 mg | ORAL_TABLET | Freq: Four times a day (QID) | ORAL | Status: DC | PRN
  Administered 2020-02-02 (×2): 650 mg via ORAL
  Filled 2020-02-02 (×2): qty 2

## 2020-02-02 MED ORDER — METHYLPREDNISOLONE SODIUM SUCC 125 MG IJ SOLR
0.5000 mg/kg | Freq: Two times a day (BID) | INTRAMUSCULAR | Status: DC
  Administered 2020-02-02: 42.5 mg via INTRAVENOUS
  Filled 2020-02-02: qty 2

## 2020-02-02 MED ORDER — ENOXAPARIN SODIUM 40 MG/0.4ML ~~LOC~~ SOLN: 40.0000 mg | SUBCUTANEOUS | Status: DC

## 2020-02-02 MED ORDER — HEPARIN BOLUS VIA INFUSION
4500.0000 [IU] | Freq: Once | INTRAVENOUS | Status: AC
  Administered 2020-02-02: 4500 [IU] via INTRAVENOUS

## 2020-02-02 MED ORDER — HEPARIN BOLUS VIA INFUSION
1000.0000 [IU] | Freq: Once | INTRAVENOUS | Status: AC
  Administered 2020-02-02: 1000 [IU] via INTRAVENOUS
  Filled 2020-02-02: qty 1000

## 2020-02-02 MED ORDER — IOHEXOL 350 MG/ML SOLN
100.0000 mL | Freq: Once | INTRAVENOUS | Status: AC | PRN
  Administered 2020-02-02: 100 mL via INTRAVENOUS

## 2020-02-02 MED ORDER — PANTOPRAZOLE SODIUM 40 MG PO TBEC
40.0000 mg | DELAYED_RELEASE_TABLET | Freq: Every day | ORAL | Status: DC
  Administered 2020-02-02 – 2020-02-04 (×3): 40 mg via ORAL
  Filled 2020-02-02 (×3): qty 1

## 2020-02-02 MED ORDER — SODIUM CHLORIDE 0.9 % IV SOLN: 100.0000 mg | Freq: Every day | INTRAVENOUS | Status: DC

## 2020-02-02 MED ORDER — SODIUM CHLORIDE 0.9 % IV SOLN: 200.0000 mg | Freq: Once | INTRAVENOUS | Status: DC

## 2020-02-02 MED ORDER — ZINC SULFATE 220 (50 ZN) MG PO CAPS
220.0000 mg | ORAL_CAPSULE | Freq: Every day | ORAL | Status: DC
  Administered 2020-02-02 – 2020-02-04 (×3): 220 mg via ORAL
  Filled 2020-02-02 (×3): qty 1

## 2020-02-02 MED ORDER — INFLUENZA VAC SPLIT QUAD 0.5 ML IM SUSY: 0.5000 mL | PREFILLED_SYRINGE | INTRAMUSCULAR | Status: DC

## 2020-02-02 MED ORDER — HEPARIN (PORCINE) 25000 UT/250ML-% IV SOLN
1150.0000 [IU]/h | INTRAVENOUS | Status: DC
  Administered 2020-02-02: 1200 [IU]/h via INTRAVENOUS
  Administered 2020-02-03: 1300 [IU]/h via INTRAVENOUS
  Filled 2020-02-02 (×2): qty 250

## 2020-02-02 MED ORDER — ENSURE ENLIVE PO LIQD
237.0000 mL | Freq: Two times a day (BID) | ORAL | Status: DC
  Administered 2020-02-03 – 2020-02-04 (×2): 237 mL via ORAL

## 2020-02-02 MED ORDER — ALBUTEROL SULFATE HFA 108 (90 BASE) MCG/ACT IN AERS
2.0000 | INHALATION_SPRAY | Freq: Four times a day (QID) | RESPIRATORY_TRACT | Status: DC
  Administered 2020-02-02 – 2020-02-04 (×11): 2 via RESPIRATORY_TRACT
  Filled 2020-02-02: qty 6.7

## 2020-02-02 MED ORDER — DM-GUAIFENESIN ER 30-600 MG PO TB12
1.0000 | ORAL_TABLET | Freq: Two times a day (BID) | ORAL | Status: DC
  Administered 2020-02-02 – 2020-02-04 (×6): 1 via ORAL
  Filled 2020-02-02 (×6): qty 1

## 2020-02-02 MED ORDER — GUAIFENESIN-DM 100-10 MG/5ML PO SYRP
10.0000 mL | ORAL_SOLUTION | ORAL | Status: DC | PRN
  Administered 2020-02-02 – 2020-02-04 (×3): 10 mL via ORAL
  Filled 2020-02-02 (×3): qty 10

## 2020-02-02 MED ORDER — SODIUM CHLORIDE 0.9 % IV SOLN
100.0000 mg | Freq: Every day | INTRAVENOUS | Status: DC
  Administered 2020-02-03 – 2020-02-04 (×2): 100 mg via INTRAVENOUS
  Filled 2020-02-02 (×2): qty 20

## 2020-02-02 MED ORDER — SODIUM CHLORIDE 0.9 % IV SOLN
1000.0000 mL | INTRAVENOUS | Status: DC
  Administered 2020-02-02 (×2): 1000 mL via INTRAVENOUS

## 2020-02-02 MED ORDER — PREDNISONE 50 MG PO TABS: 50.0000 mg | ORAL_TABLET | Freq: Every day | ORAL | Status: DC

## 2020-02-02 MED ORDER — METHYLPREDNISOLONE SODIUM SUCC 40 MG IJ SOLR
40.0000 mg | Freq: Once | INTRAMUSCULAR | Status: AC
  Administered 2020-02-02: 40 mg via INTRAVENOUS
  Filled 2020-02-02: qty 1

## 2020-02-02 MED ORDER — HYDROCOD POLST-CPM POLST ER 10-8 MG/5ML PO SUER
5.0000 mL | Freq: Two times a day (BID) | ORAL | Status: DC | PRN
  Administered 2020-02-03: 5 mL via ORAL
  Filled 2020-02-02: qty 5

## 2020-02-02 MED ORDER — ASCORBIC ACID 500 MG PO TABS
500.0000 mg | ORAL_TABLET | Freq: Every day | ORAL | Status: DC
  Administered 2020-02-02 – 2020-02-04 (×3): 500 mg via ORAL
  Filled 2020-02-02 (×3): qty 1

## 2020-02-02 NOTE — ED Notes (Signed)
Called dietary to get pts. Feeding supplement. Was told by dietary that pharmacy brings it to [place].

## 2020-02-02 NOTE — Progress Notes (Signed)
*  PRELIMINARY RESULTS* Echocardiogram 2D Echocardiogram has been performed.  Beverly Townsend 02/02/2020, 11:51 AM

## 2020-02-02 NOTE — Progress Notes (Signed)
ANTICOAGULATION CONSULT NOTE -   Pharmacy Consult for heparin IV Indication: pulmonary embolus  No Known Allergies  Patient Measurements: Height: 5\' 1"  (154.9 cm) Weight: 85.3 kg (188 lb) IBW/kg (Calculated) : 47.8 Heparin Dosing Weight: 67 kg  Vital Signs: BP: 149/81 (09/14 1600) Pulse Rate: 99 (09/14 1630)  Labs: Recent Labs    02/02/20 0044 02/02/20 1722  HGB 11.8*  --   HCT 36.8  --   PLT 421*  --   HEPARINUNFRC  --  0.29*  CREATININE 0.57  --     Estimated Creatinine Clearance: 85.3 mL/min (by C-G formula based on SCr of 0.57 mg/dL).   Medical History: Past Medical History:  Diagnosis Date  . Atypical chest pain    secondary to GERD  . GERD (gastroesophageal reflux disease)   . Head ache    Right sided   . Obesity     Medications:  Medications Prior to Admission  Medication Sig Dispense Refill Last Dose  . acetaminophen (TYLENOL) 500 MG tablet Take 1,000 mg by mouth every 6 (six) hours as needed for mild pain or fever.   02/01/2020 at Unknown time  . valACYclovir (VALTREX) 1000 MG tablet TAKE 1 TABLET BY MOUTH DAILY FOR 5 DAYS.REPEAT TREATMENT FOR FUTURE LESIONS WITHIN 48 HOURS AND AS NEEDED (Patient taking differently: Take 500 mg by mouth See admin instructions. Take 500mg  by mouth once daily for five days as needed for lesions.) 20 tablet 0 Past Month at Unknown time    Assessment: Pharmacy consulted to dose heparin in patient with PE.  CT angio positive for bilateral lower lobe PE.  Patient is not on anticoagulation prior to admission.  CBC WNL Heparin level 0.29- slightly subtherapeutic   Goal of Therapy:  Heparin level 0.3-0.7 units/ml Monitor platelets by anticoagulation protocol: Yes   Plan:  Rebolus 1000 units x 1  Increase heparin infusion to 1300 units/hr Check anti-Xa level in 6 hours and daily. Continue to monitor H&H and platelets.   Margot Ables, PharmD Clinical Pharmacist 02/02/2020 6:15 PM

## 2020-02-02 NOTE — Progress Notes (Signed)
PROGRESS NOTE  Beverly Townsend JTT:[id number] DOB: 26-Jun-1970 DOA: 02/02/2020 PCP: Martinique, Betty G, MD  Brief History:  49 year old female with a history of prediabetes, GERD presenting with approximately 2-week history of shortness of breath, generalized weakness, coughing with yellow-white sputum.  She stated that about 2 to 3 days prior to admission she lost her sense of taste and began having shortness of breath and worsening coughing.  She had a pulse oximeter at home and checked her oxygen saturation which measured 84-86%.  She stated that she had fevers up to 103 F at home.  She has not been vaccinated for COVID-19.  She has been using over-the-counter remedies including vitamin C and zinc..  However because of worsening shortness of breath and coughing and her desaturation she presented for further evaluation. In the emergency department, the patient was noted to be hypoxic with oxygen saturation 85% on room air.  She was placed on 4 L with improvement of her saturation to 96%.  BMP showed a sodium 134, potassium 3.4, serum creatinine 0.57.  AST 40, ALT 50, alk phosphatase 59, total bilirubin 0.9.  WBC 11.6, hemoglobin 11.9, platelets 1 21,000.  Chest x-ray showed bilateral interstitial infiltrates.  The patient was started on remdesivir and IV steroids.  Assessment/Plan: Acute respiratory failure with hypoxia secondary to COVID-19 pneumonia -Continue IV Solu-Medrol and remdesivir -Increase Solu-Medrol to 1 mg/kg every 12 hours -Continue vitamin C and zinc -Encourage prone position and incentive spirometry -CRP 24.8 -Ferritin 1133 -D-dimer 3.15 -PCT 0.16 -Currently stable on 4 L nasal cannula -Wean oxygen as tolerated for saturation greater 90%  Hypokalemia -Replete -Check magnesium  Thrombocytosis -Likely acute phase reactant -Check iron studies  Class II obesity -BMI 35.52 -At risk for morbidity and mortality from COVID-19 infection -Lifestyle  modification  Impaired glucose tolerance -Check hemoglobin A1c  GERD -continue PPI     Status is: Inpatient  Remains inpatient appropriate because:IV treatments appropriate due to intensity of illness or inability to take PO   Dispo: The patient is from: Home              Anticipated d/c is to: Home              Anticipated d/c date is: 2 days              Patient currently is not medically stable to d/c.        Family Communication:  Pt states she will call family herself  Consultants:  none  Code Status:  FULL   DVT Prophylaxis:  [place] Lovenox   Procedures: As Listed in Progress Note Above  Antibiotics: None   Total time spent 35 minutes.  Greater than 50% spent face to face counseling and coordinating care.     Subjective: Patient states she is breathing a little better, but remains sob.  Denies cp, n/v/d, abd pain.  Denies headache.  She has nonproductive cough.    Objective: Vitals:   02/02/20 0730 02/02/20 0800 02/02/20 0815 02/02/20 0828  BP:  132/78    Pulse: 97 86 96   Resp: (!) 30  (!) 33   Temp:      TempSrc:      SpO2: 92% 97% 95% 95%  Weight:      Height:       No intake or output data in the 24 hours ending 02/02/20 0855 Weight change:  Exam:   General:  Pt is alert,  follows commands appropriately, not in acute distress  HEENT: No icterus, No thrush, No neck mass, [place]/AT  Cardiovascular: RRR, S1/S2, no rubs, no gallops  Respiratory: bibasilar rales. No wheeze  Abdomen: Soft/+BS, non tender, non distended, no guarding  Extremities: No edema, No lymphangitis, No petechiae, No rashes, no synovitis   Data Reviewed: I have personally reviewed following labs and imaging studies Basic Metabolic Panel: Recent Labs  Lab 02/02/20 0044  NA 134*  K 3.4*  CL 96*  CO2 25  GLUCOSE 109*  BUN 10  CREATININE 0.57  CALCIUM 8.5*   Liver Function Tests: Recent Labs  Lab 02/02/20 0044  AST 40  ALT 50*  ALKPHOS 59  BILITOT 0.9   PROT 7.5  ALBUMIN 2.9*   No results for input(s): LIPASE, AMYLASE in the last 168 hours. No results for input(s): AMMONIA in the last 168 hours. Coagulation Profile: No results for input(s): INR, PROTIME in the last 168 hours. CBC: Recent Labs  Lab 02/02/20 0044  WBC 11.6*  NEUTROABS 9.5*  HGB 11.8*  HCT 36.8  MCV 96.6  PLT 421*   Cardiac Enzymes: No results for input(s): CKTOTAL, CKMB, CKMBINDEX, TROPONINI in the last 168 hours. BNP: Invalid input(s): POCBNP CBG: No results for input(s): GLUCAP in the last 168 hours. HbA1C: No results for input(s): HGBA1C in the last 72 hours. Urine analysis:    Component Value Date/Time   COLORURINE yellow 07/18/2010 1029   APPEARANCEUR Clear 07/18/2010 1029   LABSPEC 1.020 07/18/2010 1029   PHURINE 5.0 07/18/2010 1029   GLUCOSEU NEGATIVE 06/16/2007 0914   HGBUR trace-intact 07/18/2010 1029   BILIRUBINUR n 04/27/2015 1227   KETONESUR NEGATIVE 06/16/2007 0914   PROTEINUR n 04/27/2015 1227   UROBILINOGEN 0.2 04/27/2015 1227   UROBILINOGEN 0.2 07/18/2010 1029   NITRITE n 04/27/2015 1227   NITRITE negative 07/18/2010 1029   LEUKOCYTESUR Negative 04/27/2015 1227   Sepsis Labs: $RemoveBefo'@[id number]'[id number]$ (procalcitonin:4,lacticidven:4) ) Recent Results (from the past 240 hour(s))  SARS Coronavirus 2 by RT PCR (hospital order, performed in [facility] hospital lab) Nasopharyngeal Nasopharyngeal Swab     Status: Abnormal   Collection Time: 02/02/20 12:44 AM   Specimen: Nasopharyngeal Swab  Result Value Ref Range Status   SARS Coronavirus 2 POSITIVE (A) NEGATIVE Final    Comment: RESULT CALLED TO, READ BACK BY AND VERIFIED WITH: T WALKER,RN$RemoveBeforeDE'@0203'AmYugOvWvOULAfS$  02/02/20 MKELLY (NOTE) SARS-CoV-2 target nucleic acids are DETECTED  SARS-CoV-2 RNA is generally detectable in upper respiratory specimens  during the acute phase of infection.  Positive results are indicative  of the presence of the identified virus, but do not rule out bacterial infection or  co-infection with other pathogens not detected by the test.  Clinical correlation with patient history and  other diagnostic information is necessary to determine patient infection status.  The expected result is negative.  Fact Sheet for Patients:   StrictlyIdeas.no   Fact Sheet for Healthcare Providers:   BankingDealers.co.za    This test is not yet approved or cleared by the [place] FDA and  has been authorized for detection and/or diagnosis of SARS-CoV-2 by FDA under an Emergency Use Authorization (EUA).  This EUA will remain in effect (meaning this test  can be used) for the duration of  the COVID-19 declaration under Section 564(b)(1) of the Act, 21 U.S.C. section 360-bbb-3(b)(1), unless the authorization is terminated or revoked sooner.  Performed at [facility], [address], [place], [place] [address]      Scheduled Meds:  albuterol  2  puff Inhalation Q6H   vitamin C  500 mg Oral Daily   dextromethorphan-guaiFENesin  1 tablet Oral BID   enoxaparin (LOVENOX) injection  40 mg Subcutaneous Q24H   feeding supplement (ENSURE ENLIVE)  237 mL Oral BID BM   methylPREDNISolone (SOLU-MEDROL) injection  80 mg Intravenous Q12H   methylPREDNISolone (SOLU-MEDROL) injection  40 mg Intravenous Once   pantoprazole  40 mg Oral QAC breakfast   zinc sulfate  220 mg Oral Daily   Continuous Infusions:  sodium chloride 1,000 mL (02/02/20 0115)   [START ON 02/03/2020] remdesivir 100 mg in NS 100 mL      Procedures/Studies: DG Chest Port 1 View  Result Date: 02/02/2020 CLINICAL DATA:  Cough and shortness of breath. EXAM: PORTABLE CHEST 1 VIEW COMPARISON:  November 12, 2015 FINDINGS: Mild ill-defined multifocal infiltrates are seen involving the bilateral lung bases and lateral aspect of the mid lung fields, right greater than left. There is no evidence of a pleural effusion or pneumothorax. The heart size and mediastinal contours  are within normal limits. The visualized skeletal structures are unremarkable. IMPRESSION: Mild multifocal bilateral infiltrates. Electronically Signed   By: Virgina Norfolk M.D.   On: 02/02/2020 01:09    Orson Eva, DO  [facility]  If 7PM-7AM, please contact night-coverage [email or web address] Password [email or web address] 02/02/2020, 8:55 AM   LOS: 0 days

## 2020-02-02 NOTE — Progress Notes (Signed)
ANTICOAGULATION CONSULT NOTE - Initial Consult  Pharmacy Consult for heparin IV Indication: pulmonary embolus  No Known Allergies  Patient Measurements: Height: 5\' 1"  (154.9 cm) Weight: 85.3 kg (188 lb) IBW/kg (Calculated) : 47.8 Heparin Dosing Weight: 67 kg  Vital Signs: Temp: 99.4 F (37.4 C) (09/14 0532) Temp Source: Oral (09/14 0532) BP: 132/78 (09/14 0800) Pulse Rate: 96 (09/14 0815)  Labs: Recent Labs    02/02/20 0044  HGB 11.8*  HCT 36.8  PLT 421*  CREATININE 0.57    Estimated Creatinine Clearance: 85.3 mL/min (by C-G formula based on SCr of 0.57 mg/dL).   Medical History: Past Medical History:  Diagnosis Date  . Atypical chest pain    secondary to GERD  . GERD (gastroesophageal reflux disease)   . Head ache    Right sided   . Obesity     Medications:  (Not in a hospital admission)   Assessment: Pharmacy consulted to dose heparin in patient with PE.  CT angio positive for bilateral lower lobe PE.  Patient is not on anticoagulation prior to admission.  CBC WNL  Goal of Therapy:  Heparin level 0.3-0.7 units/ml Monitor platelets by anticoagulation protocol: Yes   Plan:  Give 4500 units bolus x 1 Start heparin infusion at 1200 units/hr Check anti-Xa level in 6 hours and daily while on heparin Continue to monitor H&H and platelets  Margot Ables, PharmD Clinical Pharmacist 02/02/2020 10:32 AM

## 2020-02-02 NOTE — ED Notes (Signed)
Pt back in room from CT 

## 2020-02-02 NOTE — ED Notes (Signed)
Call bell within reach, pt given gingerale, bedside commode in the room, continued to be monitored, on 4L o2 at this time.

## 2020-02-02 NOTE — ED Provider Notes (Signed)
[facility] EMERGENCY DEPARTMENT Provider Note   CSN: [id number] Arrival date & time: 02/02/20  0015   Time seen 1:09 AM  History Chief Complaint  Patient presents with  . Shortness of Breath    Beverly Townsend is a 49 y.o. female.  HPI   Patient states she started having symptoms 1-1/2 to 2 weeks ago.  She states she started out feeling weak.  She has been having cough with mucus production that is "a combination" of clear, white, yellow, and green.  She denies rhinorrhea, sneezing, or sore throat.  She states she lost her sense of taste last week.  She denies nausea, vomiting, diarrhea or chest pain but states her chest feels tight and that started last week.  She states she has been monitoring her oxygen and her heart rate has been over 110 and her oxygen has been 84 to 86% and will get up to 90% since last week.  She has not been tested prior to coming to the ED.  She has no known exposure to Covid.  She has been taking vitamin C and zinc.  Patient has not had a Covid vaccine  PCP Martinique, Betty G, MD   Past Medical History:  Diagnosis Date  . Atypical chest pain    secondary to GERD  . GERD (gastroesophageal reflux disease)   . Head ache    Right sided   . Obesity     Patient Active Problem List   Diagnosis Date Noted  . Family history of colon cancer 04/08/2011  . BACK PAIN 07/18/2010  . Prediabetes 03/14/2010  . OBESITY, UNSPECIFIED 02/27/2010  . Chronic headaches 06/18/2007    Past Surgical History:  Procedure Laterality Date  . ABDOMINAL HYSTERECTOMY     with BSO  . BREAST CYST ASPIRATION       OB History   No obstetric history on file.     Family History  Problem Relation Age of Onset  . Cancer Mother        breast  . Hypertension Mother   . Breast cancer Mother   . Cancer Father        colon in early 58's  . Colon cancer Father        early 53's  . Stroke Paternal Grandmother   . Diabetes Paternal Grandmother   . Diabetes Other   .  Breast cancer Maternal Aunt   . Stomach cancer Maternal Grandfather   . Colon polyps Neg Hx   . Esophageal cancer Neg Hx   . Rectal cancer Neg Hx     Social History   Tobacco Use  . Smoking status: Never Smoker  . Smokeless tobacco: Never Used  Vaping Use  . Vaping Use: Never used  Substance Use Topics  . Alcohol use: No    Alcohol/week: 0.0 standard drinks  . Drug use: No  owns her own business  Home Medications Prior to Admission medications   Medication Sig Start Date End Date Taking? Authorizing Provider  baclofen (LIORESAL) 10 MG tablet Take 1 tablet (10 mg total) by mouth 3 (three) times daily as needed for muscle spasms. Patient not taking: Reported on 10/06/2018 08/23/17   Martinique, Betty G, MD  CALCIUM PO Take by mouth.    [provider]  Cyanocobalamin (VITAMIN B 12 PO) Take by mouth daily.    [provider]  esomeprazole (NEXIUM) 40 MG packet Take 40 mg by mouth daily before breakfast. 10/07/18   Mauri Pole, MD  Multiple Vitamins-Minerals (MULTIVITAMIN WITH MINERALS) tablet Take 1 tablet by mouth daily.    [provider]  Multiple Vitamins-Minerals (ZINC PO) Take by mouth daily.    [provider]  omeprazole (PRILOSEC) 20 MG capsule Take once daily 30 min before breakfast. 08/27/18   Nandigam, Venia Minks, MD  valACYclovir (VALTREX) 1000 MG tablet TAKE 1 TABLET BY MOUTH DAILY FOR 5 DAYS.REPEAT TREATMENT FOR FUTURE LESIONS WITHIN 48 HOURS AND AS NEEDED 01/18/20   Martinique, Betty G, MD  vitamin C (ASCORBIC ACID) 500 MG tablet Take 500 mg by mouth daily.    [provider]    Allergies    Patient has no known allergies.  Review of Systems   Review of Systems  All other systems reviewed and are negative.   Physical Exam Updated Vital Signs BP 133/82   Pulse (!) 103   Temp 99.6 F (37.6 C) (Oral)   Resp (!) 34   Ht 5\' 1"  (1.549 m)   Wt 85.3 kg   SpO2 96%   BMI 35.52 kg/m   Physical Exam Vitals and nursing note  reviewed.  Constitutional:      General: She is in acute distress.     Appearance: Normal appearance. She is obese.  HENT:     Head: Normocephalic and atraumatic.     Right Ear: External ear normal.     Left Ear: External ear normal.  Eyes:     Extraocular Movements: Extraocular movements intact.     Conjunctiva/sclera: Conjunctivae normal.     Pupils: Pupils are equal, round, and reactive to light.  Cardiovascular:     Rate and Rhythm: Regular rhythm. Tachycardia present.     Heart sounds: Normal heart sounds. No murmur heard.   Pulmonary:     Effort: Tachypnea and respiratory distress present.     Breath sounds: Decreased air movement present. Decreased breath sounds present.     Comments: Coughs when she breathes deep Musculoskeletal:     Cervical back: Normal range of motion.     Right lower leg: No edema.     Left lower leg: No edema.  Skin:    General: Skin is warm and dry.  Neurological:     General: No focal deficit present.     Mental Status: She is alert and oriented to person, place, and time.     Cranial Nerves: No cranial nerve deficit.  Psychiatric:        Mood and Affect: Mood normal. Affect is flat.        Behavior: Behavior normal.        Thought Content: Thought content normal.     ED Results / Procedures / Treatments   Labs (all labs ordered are listed, but only abnormal results are displayed) Results for orders placed or performed during the hospital encounter of 02/02/20  SARS Coronavirus 2 by RT PCR (hospital order, performed in [place] hospital lab) Nasopharyngeal Nasopharyngeal Swab   Specimen: Nasopharyngeal Swab  Result Value Ref Range   SARS Coronavirus 2 POSITIVE (A) NEGATIVE  Lactic acid, plasma  Result Value Ref Range   Lactic Acid, Venous 1.4 0.5 - 1.9 mmol/L  CBC WITH DIFFERENTIAL  Result Value Ref Range   WBC 11.6 (H) 4.0 - 10.5 K/uL   RBC 3.81 (L) 3.87 - 5.11 MIL/uL   Hemoglobin 11.8 (L) 12.0 - 15.0 g/dL   HCT 36.8 36 - 46 %     MCV 96.6 80.0 - 100.0 fL   MCH  31.0 26.0 - 34.0 pg   MCHC 32.1 30.0 - 36.0 g/dL   RDW 13.2 11.5 - 15.5 %   Platelets 421 (H) 150 - 400 K/uL   nRBC 0.0 0.0 - 0.2 %   Neutrophils Relative % 82 %   Neutro Abs 9.5 (H) 1.7 - 7.7 K/uL   Lymphocytes Relative 11 %   Lymphs Abs 1.3 0.7 - 4.0 K/uL   Monocytes Relative 6 %   Monocytes Absolute 0.7 0 - 1 K/uL   Eosinophils Relative 0 %   Eosinophils Absolute 0.1 0 - 0 K/uL   Basophils Relative 0 %   Basophils Absolute 0.0 0 - 0 K/uL   Immature Granulocytes 1 %   Abs Immature Granulocytes 0.09 (H) 0.00 - 0.07 K/uL   Reactive, Benign Lymphocytes PRESENT   Comprehensive metabolic panel  Result Value Ref Range   Sodium 134 (L) 135 - 145 mmol/L   Potassium 3.4 (L) 3.5 - 5.1 mmol/L   Chloride 96 (L) 98 - 111 mmol/L   CO2 25 22 - 32 mmol/L   Glucose, Bld 109 (H) 70 - 99 mg/dL   BUN 10 6 - 20 mg/dL   Creatinine, Ser 0.57 0.44 - 1.00 mg/dL   Calcium 8.5 (L) 8.9 - 10.3 mg/dL   Total Protein 7.5 6.5 - 8.1 g/dL   Albumin 2.9 (L) 3.5 - 5.0 g/dL   AST 40 15 - 41 U/L   ALT 50 (H) 0 - 44 U/L   Alkaline Phosphatase 59 38 - 126 U/L   Total Bilirubin 0.9 0.3 - 1.2 mg/dL   GFR calc non Af Amer >60 >60 mL/min   GFR calc Af Amer >60 >60 mL/min   Anion gap 13 5 - 15  D-dimer, quantitative  Result Value Ref Range   D-Dimer, Quant 3.15 (H) 0.00 - 0.50 ug/mL-FEU  Procalcitonin  Result Value Ref Range   Procalcitonin 0.16 ng/mL  Lactate dehydrogenase  Result Value Ref Range   LDH 389 (H) 98 - 192 U/L  Ferritin  Result Value Ref Range   Ferritin 1,133 (H) 11 - 307 ng/mL  Triglycerides  Result Value Ref Range   Triglycerides 95 <150 mg/dL  Fibrinogen  Result Value Ref Range   Fibrinogen >800 (H) 210 - 475 mg/dL  C-reactive protein  Result Value Ref Range   CRP 24.8 (H) <1.0 mg/dL   Laboratory interpretation all normal except leukocytosis, mild anemia, elevated acute phase markers consistent with Covid infection    EKG EKG  Interpretation  Date/Time:  Tuesday February 02 2020 01:22:27 EDT Ventricular Rate:  110 PR Interval:    QRS Duration: 72 QT Interval:  373 QTC Calculation: 507 R Axis:   50 Text Interpretation: Sinus tachycardia Borderline repolarization abnormality Borderline prolonged QT interval Since last tracing rate faster 12 Nov 2015 Confirmed by Rolland Porter [phone]) on 02/02/2020 1:47:03 AM   Radiology DG Chest Port 1 View  Result Date: 02/02/2020 CLINICAL DATA:  Cough and shortness of breath. EXAM: PORTABLE CHEST 1 VIEW COMPARISON:  November 12, 2015 FINDINGS: Mild ill-defined multifocal infiltrates are seen involving the bilateral lung bases and lateral aspect of the mid lung fields, right greater than left. There is no evidence of a pleural effusion or pneumothorax. The heart size and mediastinal contours are within normal limits. The visualized skeletal structures are unremarkable. IMPRESSION: Mild multifocal bilateral infiltrates. Electronically Signed   By: Virgina Norfolk M.D.   On: 02/02/2020 01:09    Procedures Procedures (including critical care time)  Medications Ordered in ED Medications  0.9 %  sodium chloride infusion (1,000 mLs Intravenous New Bag/Given 02/02/20 0115)    ED Course  I have reviewed the triage vital signs and the nursing notes.  Pertinent labs & imaging results that were available during my care of the patient were reviewed by me and considered in my medical decision making (see chart for details).    MDM Rules/Calculators/A&P                          I had reviewed patient's chest x-ray prior to going to see patient and it is consistent with Covid pneumonia.  When I entered the room patient was on 3 L nasal cannula oxygen and her pulse ox was 99%.  Her heart rate was 108 and respiratory rate was 30.  Nurse reports with minimal exertion getting off her clothes her pulse ox dropped to 90% and her respiratory rate up to 40s.  I have talked to the patient that with  her low oxygen she would need to be admitted to the hospital and she is agreeable.  Laboratory testing was done to get patient admitted.  3:05 AM Dr. Josephine Cables, hospitalist will admit.  [name] was evaluated in Emergency Department on 02/02/2020 for the symptoms described in the history of present illness. She was evaluated in the context of the global COVID-19 pandemic, which necessitated consideration that the patient might be at risk for infection with the SARS-CoV-2 virus that causes COVID-19. Institutional protocols and algorithms that pertain to the evaluation of patients at risk for COVID-19 are in a state of rapid change based on information released by regulatory bodies including the CDC and federal and state organizations. These policies and algorithms were followed during the patient's care in the ED.   Final Clinical Impression(s) / ED Diagnoses Final diagnoses:  Pneumonia due to COVID-19 virus  Hypoxia  Shortness of breath    Rx / DC Orders  Plan admission  Rolland Porter, MD, Barbette Or, MD 02/02/20 [phone]

## 2020-02-02 NOTE — ED Notes (Signed)
Date and time results received: 02/02/20 937  Test: CT Angio Critical Value: Positive for multiple bilateral lower lobe pulmonary embolism and glass-like opacities. Name of Provider Notified: paged Dr. Carles Collet  Orders Received? Or Actions Taken?: no new orders at this time.

## 2020-02-02 NOTE — H&P (Signed)
History and Physical  Beverly Townsend RKY:[id number] DOB: [birth year] DOA: 02/02/2020  Referring physician: Rolland Porter, MD PCP: Martinique, Betty G, MD  Patient coming from: Home  Chief Complaint: Shortness of breath  HPI: Beverly Townsend is a 49 y.o. female with medical history significant for obesity and GERD who presents to the emergency department due to 1.5-2 weeks of onset of generalized weakness, with dry cough most of the time with occasional white mucus (about 2-3 days ago with loss of sense of taste. She complained of chest tightness that started a week ago, patient states that she has been monitoring home oxygen level with a pulse oximeter with O2 sat ranging within 84-86% with occasional increase to 90% since last week. Patient has not been to any physician for treatment, but she has been using over-the-counter vitamin C and zinc. Patient denies sore throat, nasal congestion, nausea, vomiting, abdominal pain, chest pain or diarrhea. She denies exposure to any known sick contact or Covid. She endorsed feeling more tired with worsening shortness of breath today, so she activated EMS, on arrival of EMS, she was noted to have an O2 sat in the upper 80s, supplemental oxygen 6 LPM was provided in route to the hospital with an increase in O2 sat to high 90s.  ED Course:  In the emergency department, she was tachypneic and initially tachycardic. O2 sat range within 96-98% on supplemental oxygen at 4 LPM. Work-up in the ED showed mild leukocytosis, thrombocytopenia, hypokalemia. Fibrinogen > 800, LDH 389, D-dimer 3.05, Albumin 2.9. Chest x-ray showed mild multifocal bilateral infiltrates. IV hydration was provided and hospitalist was asked to admit patient for further evaluation and management.  Review of Systems: Constitutional: Negative for chills and fever.  HENT: Negative for ear pain and sore throat.   Eyes: Negative for pain and visual disturbance.  Respiratory: Positive for cough and  shortness of breath.   Cardiovascular: Negative for chest pain and palpitations.  Gastrointestinal: Negative for abdominal pain and vomiting.  Endocrine: Negative for polyphagia and polyuria.  Genitourinary: Negative for decreased urine volume, dysuria Musculoskeletal: Negative for arthralgias and back pain.  Skin: Negative for color change and rash.  Allergic/Immunologic: Negative for immunocompromised state.  Neurological: Negative for tremors, syncope, speech difficulty, weakness, light-headedness and headaches.  Hematological: Does not bruise/bleed easily.  All other systems reviewed and are negative   Past Medical History:  Diagnosis Date  . Atypical chest pain    secondary to GERD  . GERD (gastroesophageal reflux disease)   . Head ache    Right sided   . Obesity    Past Surgical History:  Procedure Laterality Date  . ABDOMINAL HYSTERECTOMY     with BSO  . BREAST CYST ASPIRATION      Social History:  reports that she has never smoked. She has never used smokeless tobacco. She reports that she does not drink alcohol and does not use drugs.   No Known Allergies  Family History  Problem Relation Age of Onset  . Cancer Mother        breast  . Hypertension Mother   . Breast cancer Mother   . Cancer Father        colon in early 84's  . Colon cancer Father        early 47's  . Stroke Paternal Grandmother   . Diabetes Paternal Grandmother   . Diabetes Other   . Breast cancer Maternal Aunt   . Stomach cancer Maternal Grandfather   . Colon  polyps Neg Hx   . Esophageal cancer Neg Hx   . Rectal cancer Neg Hx     Prior to Admission medications   Medication Sig Start Date End Date Taking? Authorizing Provider  baclofen (LIORESAL) 10 MG tablet Take 1 tablet (10 mg total) by mouth 3 (three) times daily as needed for muscle spasms. Patient not taking: Reported on 10/06/2018 08/23/17   Martinique, Betty G, MD  CALCIUM PO Take by mouth.    [provider]    Cyanocobalamin (VITAMIN B 12 PO) Take by mouth daily.    [provider]  esomeprazole (NEXIUM) 40 MG packet Take 40 mg by mouth daily before breakfast. 10/07/18   Nandigam, Venia Minks, MD  Multiple Vitamins-Minerals (MULTIVITAMIN WITH MINERALS) tablet Take 1 tablet by mouth daily.    [provider]  Multiple Vitamins-Minerals (ZINC PO) Take by mouth daily.    [provider]  omeprazole (PRILOSEC) 20 MG capsule Take once daily 30 min before breakfast. 08/27/18   Nandigam, Venia Minks, MD  valACYclovir (VALTREX) 1000 MG tablet TAKE 1 TABLET BY MOUTH DAILY FOR 5 DAYS.REPEAT TREATMENT FOR FUTURE LESIONS WITHIN 48 HOURS AND AS NEEDED 01/18/20   Martinique, Betty G, MD  vitamin C (ASCORBIC ACID) 500 MG tablet Take 500 mg by mouth daily.    [provider]    Physical Exam: BP 133/82   Pulse (!) 103   Temp 99.6 F (37.6 C) (Oral)   Resp (!) 34   Ht 5\' 1"  (1.549 m)   Wt 85.3 kg   SpO2 96%   BMI 35.52 kg/m   . General: 49 y.o. year-old female obese, well developed well nourished in no acute distress.  Alert and oriented x3. Marland Kitchen HEENT: NCAT, EOMI . Neck: Supple, trachea midline . Cardiovascular: Tachycardia.  Regular rate and rhythm with no rubs or gallops.  No thyromegaly or JVD noted.  No lower extremity edema. 2/4 pulses in all 4 extremities. Marland Kitchen Respiratory: Tachypnea and decreased breath sounds on auscultation.  . Abdomen: Soft nontender nondistended with normal bowel sounds x4 quadrants. . Muskuloskeletal: No cyanosis, clubbing or edema noted bilaterally . Neuro: CN II-XII intact, strength, sensation, reflexes . Skin: No ulcerative lesions noted or rashes . Psychiatry: Judgement and insight appear normal. Mood is appropriate for condition and setting          Labs on Admission:  Basic Metabolic Panel: Recent Labs  Lab 02/02/20 0044  NA 134*  K 3.4*  CL 96*  CO2 25  GLUCOSE 109*  BUN 10  CREATININE 0.57  CALCIUM 8.5*   Liver Function Tests: Recent  Labs  Lab 02/02/20 0044  AST 40  ALT 50*  ALKPHOS 59  BILITOT 0.9  PROT 7.5  ALBUMIN 2.9*   No results for input(s): LIPASE, AMYLASE in the last 168 hours. No results for input(s): AMMONIA in the last 168 hours. CBC: Recent Labs  Lab 02/02/20 0044  WBC 11.6*  NEUTROABS 9.5*  HGB 11.8*  HCT 36.8  MCV 96.6  PLT 421*   Cardiac Enzymes: No results for input(s): CKTOTAL, CKMB, CKMBINDEX, TROPONINI in the last 168 hours.  BNP (last 3 results) No results for input(s): BNP in the last 8760 hours.  ProBNP (last 3 results) No results for input(s): PROBNP in the last 8760 hours.  CBG: No results for input(s): GLUCAP in the last 168 hours.  Radiological Exams on Admission: DG Chest Port 1 View  Result Date: 02/02/2020 CLINICAL DATA:  Cough and shortness of  breath. EXAM: PORTABLE CHEST 1 VIEW COMPARISON:  November 12, 2015 FINDINGS: Mild ill-defined multifocal infiltrates are seen involving the bilateral lung bases and lateral aspect of the mid lung fields, right greater than left. There is no evidence of a pleural effusion or pneumothorax. The heart size and mediastinal contours are within normal limits. The visualized skeletal structures are unremarkable. IMPRESSION: Mild multifocal bilateral infiltrates. Electronically Signed   By: Virgina Norfolk M.D.   On: 02/02/2020 01:09    EKG: I independently viewed the EKG done and my findings are as followed: Sinus tachycardia at a rate of 110 bpm with prolonged QTc (572ms)  Assessment/Plan Present on Admission: . Pneumonia due to COVID-19 virus . Obesity, unspecified  Principal Problem:   Pneumonia due to COVID-19 virus Active Problems:   Obesity, unspecified   Acute respiratory failure with hypoxia (HCC)   GERD (gastroesophageal reflux disease)   Hypokalemia   Thrombocytopenia (HCC)   Hypoalbuminemia   Prolonged QT interval   Acute respiratory failure with hypoxia secondary to COVID-19 virus pneumonia Chest x-ray showed mild  multifocal bilateral infiltrates.  Continue albuterol q.6h Continue IV Solu-Medrol Continue IV Remdesivir per pharmacy protocol Continue vitamin-C 500 mg p.o. Daily Continue zinc 220 mg p.o. Daily Continue Mucinex, Robitussin and Tussionex Continue Tylenol p.r.n. for fever Continue supplemental oxygen to maintain O2 sat > or = 94% with plan to wean patient off supplemental oxygen as tolerated (of note, patient does not use oxygen at baseline) Continue incentive spirometry and flutter valve q59min as tolerated Encourage proning, early ambulation, and side laying as tolerated Continue airborne isolation precaution Inflammatory markers: LDH: 389  CRP: 24.8 D-dimer: 3.15 Ferritin: 1133 Fibrinogen:> 800 Continue monitoring daily inflammatory markers Physician PPE:  Surgical mask with face shield, N-95, nonsterile gloves, disposable gown, head and shoe cover s Patient PPE:  Face mask   Hypokalemia K+ 3.4; this will be replenished  Prolonged QTc QTc 561ms Avoid QT prolonging drugs Magnesium level will be checked Repeat EKG in the morning  Thrombocytopenia possibly reactive Platelets 421, no sign of bleeding Continue to monitor platelets with morning labs  Hypoalbuminemia possibly due to moderate protein calorie malnutrition Albumin 2.9, protein supplements were provided  GERD Continue PPI  Obesity Patient will be counseled on diet and exercise when she is more stable Patient will follow up with PCP for weight loss program  DVT prophylaxis: Lovenox  Code Status: Full code  Family Communication: None at bedside  Disposition Plan:  Patient is from:                        home Anticipated DC to:                   SNF or family members home Anticipated DC date:               2-3 days Anticipated DC barriers:          Patient is unstable to discharge at this time due to pneumonia and hypoxia secondary to COVID-19 virus infection that requires inpatient  treatment   Consults called: None  Admission status: Inpatient    Bernadette Hoit MD [facility]  If 7PM-7AM, please contact night-coverage [email or web address] Password [facility]  02/02/2020, 3:29 AM

## 2020-02-02 NOTE — Plan of Care (Signed)
  Problem: Education: Goal: Knowledge of risk factors and measures for prevention of condition will improve Outcome: Progressing   Problem: Coping: Goal: Psychosocial and spiritual needs will be supported Outcome: Progressing   Problem: Respiratory: Goal: Will maintain a patent airway Outcome: Progressing Goal: Complications related to the disease process, condition or treatment will be avoided or minimized Outcome: Progressing   

## 2020-02-02 NOTE — ED Triage Notes (Signed)
Pt brought in by [facility] for c/o sob and low O2 sats; ems reports pt was in the upper 80's and they placed pt on O2 @ 6l and sats increased to upper 90's; pt states she has been coughing up yellow sputum

## 2020-02-02 NOTE — ED Notes (Signed)
Called pharmacy to get feeding supplement, ensure enlive, was notified to call nutritional services. Attempted to call dietary no answer at this time.

## 2020-02-03 DIAGNOSIS — I2699 Other pulmonary embolism without acute cor pulmonale: Secondary | ICD-10-CM

## 2020-02-03 LAB — COMPREHENSIVE METABOLIC PANEL
ALT: 12 U/L (ref 0–44)
AST: 19 U/L (ref 15–41)
Albumin: 3 g/dL — ABNORMAL LOW (ref 3.5–5.0)
Alkaline Phosphatase: 48 U/L (ref 38–126)
Anion gap: 10 (ref 5–15)
BUN: 36 mg/dL — ABNORMAL HIGH (ref 6–20)
CO2: 26 mmol/L (ref 22–32)
Calcium: 7.6 mg/dL — ABNORMAL LOW (ref 8.9–10.3)
Chloride: 101 mmol/L (ref 98–111)
Creatinine, Ser: 1.11 mg/dL — ABNORMAL HIGH (ref 0.44–1.00)
GFR calc Af Amer: >60 mL/min (ref >60)
GFR calc non Af Amer: 59 mL/min — ABNORMAL LOW (ref >60)
Glucose, Bld: 110 mg/dL — ABNORMAL HIGH (ref 70–99)
Potassium: 4 mmol/L (ref 3.5–5.1)
Sodium: 137 mmol/L (ref 135–145)
Total Bilirubin: 1.8 mg/dL — ABNORMAL HIGH (ref 0.3–1.2)
Total Protein: 5.5 g/dL — ABNORMAL LOW (ref 6.5–8.1)

## 2020-02-03 LAB — CBC WITH DIFFERENTIAL/PLATELET
Abs Immature Granulocytes: 0.15 10*3/uL — ABNORMAL HIGH (ref 0.00–0.07)
Basophils Absolute: 0 10*3/uL (ref 0.0–0.1)
Basophils Relative: 0 %
Eosinophils Absolute: 0 10*3/uL (ref 0.0–0.5)
Eosinophils Relative: 0 %
HCT: 34.4 % — ABNORMAL LOW (ref 36.0–46.0)
Hemoglobin: 11 g/dL — ABNORMAL LOW (ref 12.0–15.0)
Immature Granulocytes: 1 %
Lymphocytes Relative: 9 %
Lymphs Abs: 1.2 10*3/uL (ref 0.7–4.0)
MCH: 30.9 pg (ref 26.0–34.0)
MCHC: 32 g/dL (ref 30.0–36.0)
MCV: 96.6 fL (ref 80.0–100.0)
Monocytes Absolute: 0.2 10*3/uL (ref 0.1–1.0)
Monocytes Relative: 2 %
Neutro Abs: 11.5 10*3/uL — ABNORMAL HIGH (ref 1.7–7.7)
Neutrophils Relative %: 88 %
Platelets: 359 10*3/uL (ref 150–400)
RBC: 3.56 MIL/uL — ABNORMAL LOW (ref 3.87–5.11)
RDW: 13.2 % (ref 11.5–15.5)
WBC: 13.1 10*3/uL — ABNORMAL HIGH (ref 4.0–10.5)
nRBC: 0 % (ref 0.0–0.2)

## 2020-02-03 LAB — PHOSPHORUS: Phosphorus: 3.6 mg/dL (ref 2.5–4.6)

## 2020-02-03 LAB — C-REACTIVE PROTEIN: CRP: 22.8 mg/dL — ABNORMAL HIGH (ref <1.0)

## 2020-02-03 LAB — HEPARIN LEVEL (UNFRACTIONATED)
Heparin Unfractionated: 0.65 IU/mL (ref 0.30–0.70)
Heparin Unfractionated: 0.91 IU/mL — ABNORMAL HIGH (ref 0.30–0.70)

## 2020-02-03 LAB — IRON AND TIBC
Iron: 74 ug/dL (ref 28–170)
Saturation Ratios: 45 % — ABNORMAL HIGH (ref 10.4–31.8)
TIBC: 163 ug/dL — ABNORMAL LOW (ref 250–450)
UIBC: 89 ug/dL

## 2020-02-03 LAB — HEMOGLOBIN A1C
Hgb A1c MFr Bld: 6.7 % — ABNORMAL HIGH (ref 4.8–5.6)
Mean Plasma Glucose: 145.59 mg/dL

## 2020-02-03 LAB — FERRITIN
Ferritin: 747 ng/mL — ABNORMAL HIGH (ref 11–307)
Ferritin: 1192 ng/mL — ABNORMAL HIGH (ref 11–307)

## 2020-02-03 LAB — MAGNESIUM: Magnesium: 2.3 mg/dL (ref 1.7–2.4)

## 2020-02-03 MED ORDER — APIXABAN 5 MG PO TABS: 5.0000 mg | ORAL_TABLET | Freq: Two times a day (BID) | ORAL | Status: DC

## 2020-02-03 MED ORDER — APIXABAN 5 MG PO TABS
10.0000 mg | ORAL_TABLET | Freq: Two times a day (BID) | ORAL | Status: DC
  Administered 2020-02-03 – 2020-02-04 (×3): 10 mg via ORAL
  Filled 2020-02-03 (×3): qty 2

## 2020-02-03 NOTE — Progress Notes (Signed)
ANTICOAGULATION CONSULT NOTE -   Pharmacy Consult for heparin IV Indication: pulmonary embolus  No Known Allergies  Patient Measurements: Height: 5\' 1"  (154.9 cm) Weight: 85.3 kg (188 lb) IBW/kg (Calculated) : 47.8 Heparin Dosing Weight: 67 kg  Vital Signs: Temp: 97.8 F (36.6 C) (09/15 0700) Temp Source: Oral (09/15 0700) BP: 112/76 (09/15 0700) Pulse Rate: 96 (09/15 0700)  Labs: Recent Labs    02/02/20 0044 02/02/20 1722 02/03/20 0031 02/03/20 0921  HGB 11.8*  --  11.0*  --   HCT 36.8  --  34.4*  --   PLT 421*  --  359  --   HEPARINUNFRC  --  0.29* 0.65 0.91*  CREATININE 0.57  --  1.11*  --     Estimated Creatinine Clearance: 61.4 mL/min (A) (by C-G formula based on SCr of 1.11 mg/dL (H)).   Medical History: Past Medical History:  Diagnosis Date  . Atypical chest pain    secondary to GERD  . GERD (gastroesophageal reflux disease)   . Head ache    Right sided   . Obesity     Medications:  Medications Prior to Admission  Medication Sig Dispense Refill Last Dose  . acetaminophen (TYLENOL) 500 MG tablet Take 1,000 mg by mouth every 6 (six) hours as needed for mild pain or fever.   02/01/2020 at Unknown time  . valACYclovir (VALTREX) 1000 MG tablet TAKE 1 TABLET BY MOUTH DAILY FOR 5 DAYS.REPEAT TREATMENT FOR FUTURE LESIONS WITHIN 48 HOURS AND AS NEEDED (Patient taking differently: Take 500 mg by mouth See admin instructions. Take 500mg  by mouth once daily for five days as needed for lesions.) 20 tablet 0 Past Month at Unknown time    Assessment: Pharmacy consulted to dose heparin in patient with PE.  CT angio positive for bilateral lower lobe PE.  Patient is not on anticoagulation prior to admission.  CBC WNL Heparin level 0.91- supratherapeutic  Goal of Therapy:  Heparin level 0.3-0.7 units/ml Monitor platelets by anticoagulation protocol: Yes   Plan:   Decrease heparin infusion to 1150 units/hr Check anti-Xa level in 6 hours and daily. Continue to  monitor H&H and platelets. Plan is to transition to [place], PharmD Clinical Pharmacist 02/03/2020 10:25 AM

## 2020-02-03 NOTE — Progress Notes (Signed)
ANTICOAGULATION CONSULT NOTE -  Pharmacy Consult for heparin Indication: pulmonary embolus  No Known Allergies  Patient Measurements: Height: 5\' 1"  (154.9 cm) Weight: 85.3 kg (188 lb) IBW/kg (Calculated) : 47.8 Heparin Dosing Weight: 67 kg  Vital Signs: Temp: 97.9 F (36.6 C) (09/14 2137) Temp Source: Oral (09/14 2137) BP: 122/67 (09/14 2137) Pulse Rate: 90 (09/14 2137)  Labs: Recent Labs    02/02/20 0044 02/02/20 1722 02/03/20 0031  HGB 11.8*  --  11.0*  HCT 36.8  --  34.4*  PLT 421*  --  359  HEPARINUNFRC  --  0.29* 0.65  CREATININE 0.57  --   --     Estimated Creatinine Clearance: 85.3 mL/min (by C-G formula based on SCr of 0.57 mg/dL).   Assessment: 49 y.o. female with PE for heparin  Goal of Therapy:  Heparin level 0.3-0.7 units/ml Monitor platelets by anticoagulation protocol: Yes   Plan:  Continue Heparin at current rate   Phillis Knack, PharmD, BCPS  02/03/2020 1:37 AM

## 2020-02-03 NOTE — Progress Notes (Signed)
PROGRESS NOTE  FARA WORTHY TDD:[id number] DOB: 1971/03/30 DOA: 02/02/2020 PCP: [place], Betty G, MD  Brief History:  49 year old female with a history of prediabetes, GERD presenting with approximately 2-week history of shortness of breath, generalized weakness, coughing with yellow-white sputum.  She stated that about 2 to 3 days prior to admission she lost her sense of taste and began having shortness of breath and worsening coughing.  She had a pulse oximeter at home and checked her oxygen saturation which measured 84-86%.  She stated that she had fevers up to 103 F at home.  She has not been vaccinated for COVID-19.  She has been using over-the-counter remedies including vitamin C and zinc..  However because of worsening shortness of breath and coughing and her desaturation she presented for further evaluation. In the emergency department, the patient was noted to be hypoxic with oxygen saturation 85% on room air.  She was placed on 4 L with improvement of her saturation to 96%.  BMP showed a sodium 134, potassium 3.4, serum creatinine 0.57.  AST 40, ALT 50, alk phosphatase 59, total bilirubin 0.9.  WBC 11.6, hemoglobin 11.9, platelets 1 21,000.  Chest x-ray showed bilateral interstitial infiltrates.  The patient was started on remdesivir and IV steroids.  Assessment/Plan: Acute respiratory failure with hypoxia secondary to COVID-19 pneumonia -Continue IV Solu-Medrol and remdesivir -Increase Solu-Medrol to 1 mg/kg every 12 hours -Continue vitamin C and zinc -Encourage prone position and incentive spirometry -CRP 24.8>22.8 -Ferritin 6237>628 -D-dimer 3.15 -PCT 0.16 -Currently stable on 4 L nasal cannula -Wean oxygen as tolerated for saturation greater 90%  Acute bilateral pulmonary emboli -due to COVID 19 infection -started on heparin infusion -transition to eliquis -no evidence of right heart strain on CT imaging -Echo is unremarkable, Pulmonary pressure 37 mmHg, RV  normal  Hypokalemia -Repleted -Magnesium normal  Thrombocytosis -Likely acute phase reactant -Iron studies do not show any deficiency  Class II obesity -BMI 35.52 -At risk for morbidity and mortality from COVID-19 infection -Lifestyle modification  Impaired glucose tolerance -A1c 6.7 -We will need lifestyle modification versus Metformin.  GERD -continue PPI     Status is: Inpatient  Remains inpatient appropriate because:IV treatments appropriate due to intensity of illness or inability to take PO   Dispo: The patient is from: Home              Anticipated d/c is to: Home              Anticipated d/c date is: 2 days              Patient currently is not medically stable to d/c.        Family Communication: Updated patient's husband 9/15  Consultants:  none  Code Status:  FULL   DVT Prophylaxis:  [place] Lovenox   Procedures: As Listed in Progress Note Above  Antibiotics: None   Total time spent 35 minutes.  Greater than 50% spent face to face counseling and coordinating care.     Subjective: She is feeling better, but her breathing is not back to baseline.  Still has some shortness of breath.  Feels weak.  Becomes short of breath on exertion.  Objective: Vitals:   02/03/20 0755 02/03/20 1400 02/03/20 1415 02/03/20 1937  BP:  (!) 100/49    Pulse:  (!) 108    Resp:  20    Temp:  98.6 F (37 C)    TempSrc:  Oral    SpO2: 93% 95% 95% 94%  Weight:      Height:        Intake/Output Summary (Last 24 hours) at 02/03/2020 1954 Last data filed at 02/03/2020 1800 Gross per 24 hour  Intake 3570 ml  Output --  Net 3570 ml   Weight change:  Exam:  General exam: Alert, awake, oriented x 3 Respiratory system: Clear to auscultation. Respiratory effort normal. Cardiovascular system:RRR. No murmurs, rubs, gallops. Gastrointestinal system: Abdomen is nondistended, soft and nontender. No organomegaly or masses felt. Normal bowel sounds heard. Central  nervous system: Alert and oriented. No focal neurological deficits. Extremities: No C/C/E, +pedal pulses Skin: No rashes, lesions or ulcers  Psychiatry: Judgement and insight appear normal. Mood & affect appropriate.    Data Reviewed: I have personally reviewed following labs and imaging studies Basic Metabolic Panel: Recent Labs  Lab 02/02/20 0044 02/03/20 0031  NA 134* 137  K 3.4* 4.0  CL 96* 101  CO2 25 26  GLUCOSE 109* 110*  BUN 10 36*  CREATININE 0.57 1.11*  CALCIUM 8.5* 7.6*  MG  --  2.3  PHOS  --  3.6   Liver Function Tests: Recent Labs  Lab 02/02/20 0044 02/03/20 0031  AST 40 19  ALT 50* 12  ALKPHOS 59 48  BILITOT 0.9 1.8*  PROT 7.5 5.5*  ALBUMIN 2.9* 3.0*   No results for input(s): LIPASE, AMYLASE in the last 168 hours. No results for input(s): AMMONIA in the last 168 hours. Coagulation Profile: No results for input(s): INR, PROTIME in the last 168 hours. CBC: Recent Labs  Lab 02/02/20 0044 02/03/20 0031  WBC 11.6* 13.1*  NEUTROABS 9.5* 11.5*  HGB 11.8* 11.0*  HCT 36.8 34.4*  MCV 96.6 96.6  PLT 421* 359   Cardiac Enzymes: No results for input(s): CKTOTAL, CKMB, CKMBINDEX, TROPONINI in the last 168 hours. BNP: Invalid input(s): POCBNP CBG: No results for input(s): GLUCAP in the last 168 hours. HbA1C: Recent Labs    02/02/20 0151  HGBA1C 6.7*   Urine analysis:    Component Value Date/Time   COLORURINE yellow 07/18/2010 1029   APPEARANCEUR Clear 07/18/2010 1029   LABSPEC 1.020 07/18/2010 1029   PHURINE 5.0 07/18/2010 1029   GLUCOSEU NEGATIVE 06/16/2007 0914   HGBUR trace-intact 07/18/2010 1029   BILIRUBINUR n 04/27/2015 1227   KETONESUR NEGATIVE 06/16/2007 0914   PROTEINUR n 04/27/2015 1227   UROBILINOGEN 0.2 04/27/2015 1227   UROBILINOGEN 0.2 07/18/2010 1029   NITRITE n 04/27/2015 1227   NITRITE negative 07/18/2010 1029   LEUKOCYTESUR Negative 04/27/2015 1227   Sepsis Labs: $RemoveBefo'@[email or web address]'lQIvWPlZGht$ (procalcitonin:4,lacticidven:4) ) Recent  Results (from the past 240 hour(s))  SARS Coronavirus 2 by RT PCR (hospital order, performed in [place] hospital lab) Nasopharyngeal Nasopharyngeal Swab     Status: Abnormal   Collection Time: 02/02/20 12:44 AM   Specimen: Nasopharyngeal Swab  Result Value Ref Range Status   SARS Coronavirus 2 POSITIVE (A) NEGATIVE Final    Comment: RESULT CALLED TO, READ BACK BY AND VERIFIED WITH: T WALKER,RN$RemoveBeforeDE'@0203'aZEJECVIlniGdZc$  02/02/20 MKELLY (NOTE) SARS-CoV-2 target nucleic acids are DETECTED  SARS-CoV-2 RNA is generally detectable in upper respiratory specimens  during the acute phase of infection.  Positive results are indicative  of the presence of the identified virus, but do not rule out bacterial infection or co-infection with other pathogens not detected by the test.  Clinical correlation with patient history and  other diagnostic information is necessary to determine patient infection status.  The expected  result is negative.  Fact Sheet for Patients:   StrictlyIdeas.no   Fact Sheet for Healthcare Providers:   BankingDealers.co.za    This test is not yet approved or cleared by the [place] FDA and  has been authorized for detection and/or diagnosis of SARS-CoV-2 by FDA under an Emergency Use Authorization (EUA).  This EUA will remain in effect (meaning this test  can be used) for the duration of  the COVID-19 declaration under Section 564(b)(1) of the Act, 21 U.S.C. section 360-bbb-3(b)(1), unless the authorization is terminated or revoked sooner.  Performed at [facility], [address], [place], [place] [address]   Blood Culture (routine x 2)     Status: None (Preliminary result)   Collection Time: 02/02/20  1:33 AM   Specimen: BLOOD LEFT HAND  Result Value Ref Range Status   Specimen Description BLOOD LEFT HAND  Final   Special Requests   Final    BOTTLES DRAWN AEROBIC AND ANAEROBIC Blood Culture adequate volume   Culture   Final    NO  GROWTH 1 DAY Performed at [facility], [address], [place], [place] [address]    Report Status PENDING  Incomplete  Blood Culture (routine x 2)     Status: None (Preliminary result)   Collection Time: 02/02/20  1:51 AM   Specimen: BLOOD LEFT HAND  Result Value Ref Range Status   Specimen Description BLOOD LEFT HAND  Final   Special Requests   Final    BOTTLES DRAWN AEROBIC AND ANAEROBIC Blood Culture adequate volume   Culture   Final    NO GROWTH 1 DAY Performed at [facility], [address], [place], [place] [address]    Report Status PENDING  Incomplete     Scheduled Meds:  albuterol  2 puff Inhalation Q6H   apixaban  10 mg Oral BID   vitamin C  500 mg Oral Daily   dextromethorphan-guaiFENesin  1 tablet Oral BID   feeding supplement (ENSURE ENLIVE)  237 mL Oral BID BM   influenza vac split quadrivalent PF  0.5 mL Intramuscular Tomorrow-1000   methylPREDNISolone (SOLU-MEDROL) injection  80 mg Intravenous Q12H   pantoprazole  40 mg Oral QAC breakfast   zinc sulfate  220 mg Oral Daily   Continuous Infusions:  remdesivir 100 mg in NS 100 mL 100 mg (02/03/20 0936)    Procedures/Studies: CT ANGIO CHEST PE W OR WO CONTRAST  Result Date: 02/02/2020 CLINICAL DATA:  COVID positive, elevated D-dimer. Shortness of breath, low O2 sats. EXAM: CT ANGIOGRAPHY CHEST WITH CONTRAST TECHNIQUE: Multidetector CT imaging of the chest was performed using the standard protocol during bolus administration of intravenous contrast. Multiplanar CT image reconstructions and MIPs were obtained to evaluate the vascular anatomy. CONTRAST:  132mL OMNIPAQUE IOHEXOL 350 MG/ML SOLN COMPARISON:  Chest x-ray today FINDINGS: Cardiovascular: Filling defects are seen within bilateral lower lobe pulmonary arteries compatible with pulmonary emboli, small. No evidence of right heart strain. Heart is normal size. Aorta normal caliber. Mediastinum/Nodes: No mediastinal, hilar, or axillary adenopathy. Trachea  and esophagus are unremarkable. Thyroid unremarkable. Lungs/Pleura: Extensive bilateral ground-glass airspace opacities, predominantly peripheral compatible with COVID pneumonia. No effusions. Upper Abdomen: Imaging into the upper abdomen demonstrates no acute findings. Musculoskeletal: Chest wall soft tissues are unremarkable. No acute bony abnormality. Review of the MIP images confirms the above findings. IMPRESSION: Small bilateral lower lobe pulmonary emboli. No evidence of right heart strain. Extensive peripheral ground-glass airspace opacities compatible with COVID pneumonia. These results will be  called to the ordering clinician or representative by the Radiologist Assistant, and communication documented in the PACS or [employer]. Electronically Signed   By: Rolm Baptise M.D.   On: 02/02/2020 09:21   DG Chest Port 1 View  Result Date: 02/02/2020 CLINICAL DATA:  Cough and shortness of breath. EXAM: PORTABLE CHEST 1 VIEW COMPARISON:  November 12, 2015 FINDINGS: Mild ill-defined multifocal infiltrates are seen involving the bilateral lung bases and lateral aspect of the mid lung fields, right greater than left. There is no evidence of a pleural effusion or pneumothorax. The heart size and mediastinal contours are within normal limits. The visualized skeletal structures are unremarkable. IMPRESSION: Mild multifocal bilateral infiltrates. Electronically Signed   By: Virgina Norfolk M.D.   On: 02/02/2020 01:09   ECHOCARDIOGRAM COMPLETE  Result Date: 02/02/2020    ECHOCARDIOGRAM REPORT   Patient Name:   MEKENNA FINAU Date of Exam: 02/02/2020 Medical Rec #:  [id number]         Height:       61.0 in Accession #:    [id number]        Weight:       188.0 lb Date of Birth:  Jun 07, 1970         BSA:          1.840 m Patient Age:    24 years          BP:           132/78 mmHg Patient Gender: F                 HR:           86 bpm. Exam Location:  [facility] Procedure: 2D Echo Indications:    Pulmonary Embolus  415.19 / I26.99  History:        Patient has no prior history of Echocardiogram examinations.                 Risk Factors:Non-Smoker. Pneumonia due to COVID-19 virus, GERD,                 Acute respiratory failure with hypoxia.  Sonographer:    Leavy Cella RDCS (AE) Referring Phys: [phone] DAVID TAT IMPRESSIONS  1. Left ventricular ejection fraction, by estimation, is 65 to 70%. The left ventricle has normal function. The left ventricle has no regional wall motion abnormalities. Left ventricular diastolic parameters were normal.  2. Right ventricular systolic function is normal. The right ventricular size is normal.  3. The mitral valve is normal in structure. Trivial mitral valve regurgitation. No evidence of mitral stenosis.  4. The aortic valve is tricuspid. Aortic valve regurgitation is not visualized. No aortic stenosis is present.  5. Mild pulmonary HTN, PASP is 37 mmHg.  6. The inferior vena cava is normal in size with greater than 50% respiratory variability, suggesting right atrial pressure of 3 mmHg. FINDINGS  Left Ventricle: Left ventricular ejection fraction, by estimation, is 65 to 70%. The left ventricle has normal function. The left ventricle has no regional wall motion abnormalities. The left ventricular internal cavity size was normal in size. There is  no left ventricular hypertrophy. Left ventricular diastolic parameters were normal. Right Ventricle: The right ventricular size is normal. No increase in right ventricular wall thickness. Right ventricular systolic function is normal. Left Atrium: Left atrial size was normal in size. Right Atrium: Right atrial size was normal in size. Pericardium: There is no evidence of pericardial effusion. Mitral Valve: The  mitral valve is normal in structure. Trivial mitral valve regurgitation. No evidence of mitral valve stenosis. Tricuspid Valve: The tricuspid valve is normal in structure. Tricuspid valve regurgitation is mild . No evidence of tricuspid  stenosis. Aortic Valve: The aortic valve is tricuspid. Aortic valve regurgitation is not visualized. No aortic stenosis is present. Aortic valve mean gradient measures 4.7 mmHg. Aortic valve peak gradient measures 8.2 mmHg. Aortic valve area, by VTI measures 1.73 cm. Pulmonic Valve: The pulmonic valve was not well visualized. Pulmonic valve regurgitation is not visualized. No evidence of pulmonic stenosis. Aorta: The aortic root is normal in size and structure. Pulmonary Artery: Mild pulmonary HTN, PASP is 37 mmHg. Venous: The inferior vena cava is normal in size with greater than 50% respiratory variability, suggesting right atrial pressure of 3 mmHg. IAS/Shunts: No atrial level shunt detected by color flow Doppler.  LEFT VENTRICLE PLAX 2D LVIDd:         3.65 cm  Diastology LVIDs:         2.42 cm  LV e' medial:    9.03 cm/s LV PW:         1.09 cm  LV E/e' medial:  8.9 LV IVS:        0.96 cm  LV e' lateral:   11.20 cm/s LVOT diam:     1.80 cm  LV E/e' lateral: 7.2 LV SV:         51 LV SV Index:   28 LVOT Area:     2.54 cm  RIGHT VENTRICLE RV S prime:     14.00 cm/s TAPSE (M-mode): 2.5 cm LEFT ATRIUM             Index       RIGHT ATRIUM           Index LA diam:        3.70 cm 2.01 cm/m  RA Area:     13.00 cm LA Vol (A2C):   64.4 ml 35.01 ml/m RA Volume:   35.40 ml  19.24 ml/m LA Vol (A4C):   40.5 ml 22.01 ml/m LA Biplane Vol: 51.2 ml 27.83 ml/m  AORTIC VALVE AV Area (Vmax):    1.99 cm AV Area (Vmean):   1.73 cm AV Area (VTI):     1.73 cm AV Vmax:           143.34 cm/s AV Vmean:          104.561 cm/s AV VTI:            0.295 m AV Peak Grad:      8.2 mmHg AV Mean Grad:      4.7 mmHg LVOT Vmax:         112.26 cm/s LVOT Vmean:        70.970 cm/s LVOT VTI:          0.200 m LVOT/AV VTI ratio: 0.68  AORTA Ao Root diam: 2.80 cm MITRAL VALVE               TRICUSPID VALVE MV Area (PHT): 3.93 cm    TR Peak grad:   43.8 mmHg MV Decel Time: 193 msec    TR Vmax:        331.00 cm/s MV E velocity: 80.60 cm/s MV A  velocity: 45.80 cm/s  SHUNTS MV E/A ratio:  1.76        Systemic VTI:  0.20 m  Systemic Diam: 1.80 cm Carlyle Dolly MD Electronically signed by Carlyle Dolly MD Signature Date/Time: 02/02/2020/12:36:58 PM    Final     Kathie Dike, MD  [facility]  If 7PM-7AM, please contact night-coverage [email or web address]  02/03/2020, 7:54 PM   LOS: 1 day

## 2020-02-04 LAB — MAGNESIUM: Magnesium: 2.2 mg/dL (ref 1.7–2.4)

## 2020-02-04 LAB — COMPREHENSIVE METABOLIC PANEL WITH GFR
ALT: 36 U/L (ref 0–44)
AST: 26 U/L (ref 15–41)
Albumin: 2.7 g/dL — ABNORMAL LOW (ref 3.5–5.0)
Alkaline Phosphatase: 58 U/L (ref 38–126)
Anion gap: 11 (ref 5–15)
BUN: 14 mg/dL (ref 6–20)
CO2: 24 mmol/L (ref 22–32)
Calcium: 9.1 mg/dL (ref 8.9–10.3)
Chloride: 101 mmol/L (ref 98–111)
Creatinine, Ser: 0.55 mg/dL (ref 0.44–1.00)
GFR calc Af Amer: >60 mL/min (ref >60)
GFR calc non Af Amer: >60 mL/min (ref >60)
Glucose, Bld: 154 mg/dL — ABNORMAL HIGH (ref 70–99)
Potassium: 4 mmol/L (ref 3.5–5.1)
Sodium: 136 mmol/L (ref 135–145)
Total Bilirubin: 0.5 mg/dL (ref 0.3–1.2)
Total Protein: 7.4 g/dL (ref 6.5–8.1)

## 2020-02-04 LAB — CBC WITH DIFFERENTIAL/PLATELET
Abs Immature Granulocytes: 0.38 K/uL — ABNORMAL HIGH (ref 0.00–0.07)
Basophils Absolute: 0 K/uL (ref 0.0–0.1)
Basophils Relative: 0 %
Eosinophils Absolute: 0 K/uL (ref 0.0–0.5)
Eosinophils Relative: 0 %
HCT: 37.9 % (ref 36.0–46.0)
Hemoglobin: 11.9 g/dL — ABNORMAL LOW (ref 12.0–15.0)
Immature Granulocytes: 2 %
Lymphocytes Relative: 6 %
Lymphs Abs: 1 K/uL (ref 0.7–4.0)
MCH: 30.4 pg (ref 26.0–34.0)
MCHC: 31.4 g/dL (ref 30.0–36.0)
MCV: 96.7 fL (ref 80.0–100.0)
Monocytes Absolute: 0.8 K/uL (ref 0.1–1.0)
Monocytes Relative: 5 %
Neutro Abs: 14.9 K/uL — ABNORMAL HIGH (ref 1.7–7.7)
Neutrophils Relative %: 87 %
Platelets: 578 K/uL — ABNORMAL HIGH (ref 150–400)
RBC: 3.92 MIL/uL (ref 3.87–5.11)
RDW: 13.3 % (ref 11.5–15.5)
WBC: 17.1 K/uL — ABNORMAL HIGH (ref 4.0–10.5)
nRBC: 0.2 % (ref 0.0–0.2)

## 2020-02-04 LAB — PHOSPHORUS: Phosphorus: 2.2 mg/dL — ABNORMAL LOW (ref 2.5–4.6)

## 2020-02-04 LAB — C-REACTIVE PROTEIN: CRP: 8.8 mg/dL — ABNORMAL HIGH (ref <1.0)

## 2020-02-04 LAB — FERRITIN: Ferritin: 1092 ng/mL — ABNORMAL HIGH (ref 11–307)

## 2020-02-04 MED ORDER — DM-GUAIFENESIN ER 30-600 MG PO TB12: 1.0000 | ORAL_TABLET | Freq: Two times a day (BID) | ORAL | 0 refills | Status: DC

## 2020-02-04 MED ORDER — ALBUTEROL SULFATE HFA 108 (90 BASE) MCG/ACT IN AERS: 2.0000 | INHALATION_SPRAY | RESPIRATORY_TRACT | 0 refills | Status: DC | PRN

## 2020-02-04 MED ORDER — PREDNISONE 20 MG PO TABS: 40.0000 mg | ORAL_TABLET | Freq: Every day | ORAL | 0 refills | Status: DC

## 2020-02-04 MED ORDER — APIXABAN 5 MG PO TABS: ORAL_TABLET | ORAL | 1 refills | Status: DC

## 2020-02-04 NOTE — Plan of Care (Signed)

## 2020-02-04 NOTE — Progress Notes (Signed)
Patient scheduled for outpatient Remdesivir infusions at 1pm on Friday 9/17 and 1pm on Saturday 9/18 at [facility]. Please inform the patient to park at [place], as staff will be escorting the patient through the [place] entrance of the hospital. Appointments take approximately 45 minutes.    There is a wave flag banner located near the entrance on [address]. Turn into this entrance and immediately turn left and park in 1 of the 5 designated Covid Infusion Parking spots. There is a phone number on the sign, please call and let the staff know what spot you are in and we will come out and get you. For questions call [phone].  Thanks.

## 2020-02-04 NOTE — Progress Notes (Signed)
SATURATION QUALIFICATIONS: (This note is used to comply with regulatory documentation for home oxygen)  Patient Saturations on Room Air at Rest = 96%  Patient Saturations on Room Air while Ambulating = 86%  Patient Saturations on 2 Liters of oxygen while Ambulating = 94%  Please briefly explain why patient needs home oxygen:  Pt had increase work of breathing with SOB while ambulating on room air, pt O2 sats dropped to 86% while on room air while ambulating.

## 2020-02-04 NOTE — TOC Transition Note (Signed)
Transition of Care [facility]) - CM/SW Discharge Note   Patient Details  Name: Beverly Townsend MRN: [id number] Date of Birth: [birth year]  [facility] CM/SW Contact:  Ihor Gully, LCSW Phone Number: 02/04/2020, 3:04 PM   Clinical Narrative:    Patient admitted for COVID+ symptom management. Will need oxygen at discharge. Uninsured. Referral made for charity oxygen. Has PCP.    Final next level of care: Home/Self Care Barriers to Discharge: No Barriers Identified   Patient Goals and CMS Choice Patient states their goals for this hospitalization and ongoing recovery are:: home      Discharge Placement                       Discharge Plan and Services     Post Acute Care Choice: Durable Medical Equipment          DME Arranged: Oxygen DME Agency: [employer] Date DME Agency Contacted: 02/04/20 Time DME Agency Contacted: 55 Representative spoke with at DME Agency: [place] ([place]) Interventions     Readmission Risk Interventions No flowsheet data found.

## 2020-02-04 NOTE — Discharge Instructions (Signed)
Patient scheduled for outpatient Remdesivir infusions at 1pm on Friday 9/17 and 1pm on Saturday 9/18 at [facility]. Please inform the patient to park at [place], as staff will be escorting the patient through the [place] entrance of the hospital. Appointments take approximately 45 minutes.    There is a wave flag banner located near the entrance on [address]. Turn into this entrance and immediately turn left and park in 1 of the 5 designated Covid Infusion Parking spots. There is a phone number on the sign, please call and let the staff know what spot you are in and we will come out and get you. For questions call [phone].  Thanks.  You will need to quarantine at home until 03/25/2020    COVID-19 COVID-19 is a respiratory infection that is caused by a virus called severe acute respiratory syndrome coronavirus 2 (SARS-CoV-2). The disease is also known as coronavirus disease or novel coronavirus. In some people, the virus may not cause any symptoms. In others, it may cause a serious infection. The infection can get worse quickly and can lead to complications, such as:  Pneumonia, or infection of the lungs.  Acute respiratory distress syndrome or ARDS. This is a condition in which fluid build-up in the lungs prevents the lungs from filling with air and passing oxygen into the blood.  Acute respiratory failure. This is a condition in which there is not enough oxygen passing from the lungs to the body or when carbon dioxide is not passing from the lungs out of the body.  Sepsis or septic shock. This is a serious bodily reaction to an infection.  Blood clotting problems.  Secondary infections due to bacteria or fungus.  Organ failure. This is when your body's organs stop working. The virus that causes COVID-19 is contagious. This means that it can spread from person to person through droplets from coughs and sneezes (respiratory secretions). What are the  causes? This illness is caused by a virus. You may catch the virus by:  Breathing in droplets from an infected person. Droplets can be spread by a person breathing, speaking, singing, coughing, or sneezing.  Touching something, like a table or a doorknob, that was exposed to the virus (contaminated) and then touching your mouth, nose, or eyes. What increases the risk? Risk for infection You are more likely to be infected with this virus if you:  Are within 6 feet (2 meters) of a person with COVID-19.  Provide care for or live with a person who is infected with COVID-19.  Spend time in crowded indoor spaces or live in shared housing. Risk for serious illness You are more likely to become seriously ill from the virus if you:  Are 48 years of age or older. The higher your age, the more you are at risk for serious illness.  Live in a nursing home or long-term care facility.  Have cancer.  Have a long-term (chronic) disease such as: ? Chronic lung disease, including chronic obstructive pulmonary disease or asthma. ? A long-term disease that lowers your body's ability to fight infection (immunocompromised). ? Heart disease, including heart failure, a condition in which the arteries that lead to the heart become narrow or blocked (coronary artery disease), a disease which makes the heart muscle thick, weak, or stiff (cardiomyopathy). ? Diabetes. ? Chronic kidney disease. ? Sickle cell disease, a condition in which red blood cells have an abnormal "sickle" shape. ? Liver disease.  Are obese.  What are the signs or symptoms? Symptoms of this condition can range from mild to severe. Symptoms may appear any time from 2 to 14 days after being exposed to the virus. They include:  A fever or chills.  A cough.  Difficulty breathing.  Headaches, body aches, or muscle aches.  Runny or stuffy (congested) nose.  A sore throat.  New loss of taste or smell. Some people may also have  stomach problems, such as nausea, vomiting, or diarrhea. Other people may not have any symptoms of COVID-19. How is this diagnosed? This condition may be diagnosed based on:  Your signs and symptoms, especially if: ? You live in an area with a COVID-19 outbreak. ? You recently traveled to or from an area where the virus is common. ? You provide care for or live with a person who was diagnosed with COVID-19. ? You were exposed to a person who was diagnosed with COVID-19.  A physical exam.  Lab tests, which may include: ? Taking a sample of fluid from the back of your nose and throat (nasopharyngeal fluid), your nose, or your throat using a swab. ? A sample of mucus from your lungs (sputum). ? Blood tests.  Imaging tests, which may include, X-rays, CT scan, or ultrasound. How is this treated? At present, there is no medicine to treat COVID-19. Medicines that treat other diseases are being used on a trial basis to see if they are effective against COVID-19. Your health care provider will talk with you about ways to treat your symptoms. For most people, the infection is mild and can be managed at home with rest, fluids, and over-the-counter medicines. Treatment for a serious infection usually takes places in a hospital intensive care unit (ICU). It may include one or more of the following treatments. These treatments are given until your symptoms improve.  Receiving fluids and medicines through an IV.  Supplemental oxygen. Extra oxygen is given through a tube in the nose, a face mask, or a hood.  Positioning you to lie on your stomach (prone position). This makes it easier for oxygen to get into the lungs.  Continuous positive airway pressure (CPAP) or bi-level positive airway pressure (BPAP) machine. This treatment uses mild air pressure to keep the airways open. A tube that is connected to a motor delivers oxygen to the body.  Ventilator. This treatment moves air into and out of the  lungs by using a tube that is placed in your windpipe.  Tracheostomy. This is a procedure to create a hole in the neck so that a breathing tube can be inserted.  Extracorporeal membrane oxygenation (ECMO). This procedure gives the lungs a chance to recover by taking over the functions of the heart and lungs. It supplies oxygen to the body and removes carbon dioxide. Follow these instructions at home: Lifestyle  If you are sick, stay home except to get medical care. Your health care provider will tell you how long to stay home. Call your health care provider before you go for medical care.  Rest at home as told by your health care provider.  Do not use any products that contain nicotine or tobacco, such as cigarettes, e-cigarettes, and chewing tobacco. If you need help quitting, ask your health care provider.  Return to your normal activities as told by your health care provider. Ask your health care provider what activities are safe for you. General instructions  Take over-the-counter and prescription medicines only as told by your health care provider.  Drink enough fluid to keep your urine pale yellow.  Keep all follow-up visits as told by your health care provider. This is important. How is this prevented?  There is no vaccine to help prevent COVID-19 infection. However, there are steps you can take to protect yourself and others from this virus. To protect yourself:   Do not travel to areas where COVID-19 is a risk. The areas where COVID-19 is reported change often. To identify high-risk areas and travel restrictions, check the CDC travel website: FatFares.com.br  If you live in, or must travel to, an area where COVID-19 is a risk, take precautions to avoid infection. ? Stay away from people who are sick. ? Wash your hands often with soap and water for 20 seconds. If soap and water are not available, use an alcohol-based hand sanitizer. ? Avoid touching your mouth,  face, eyes, or nose. ? Avoid going out in public, follow guidance from your state and local health authorities. ? If you must go out in public, wear a cloth face covering or face mask. Make sure your mask covers your nose and mouth. ? Avoid crowded indoor spaces. Stay at least 6 feet (2 meters) away from others. ? Disinfect objects and surfaces that are frequently touched every day. This may include:  Counters and tables.  Doorknobs and light switches.  Sinks and faucets.  Electronics, such as phones, remote controls, keyboards, computers, and tablets. To protect others: If you have symptoms of COVID-19, take steps to prevent the virus from spreading to others.  If you think you have a COVID-19 infection, contact your health care provider right away. Tell your health care team that you think you may have a COVID-19 infection.  Stay home. Leave your house only to seek medical care. Do not use public transport.  Do not travel while you are sick.  Wash your hands often with soap and water for 20 seconds. If soap and water are not available, use alcohol-based hand sanitizer.  Stay away from other members of your household. Let healthy household members care for children and pets, if possible. If you have to care for children or pets, wash your hands often and wear a mask. If possible, stay in your own room, separate from others. Use a different bathroom.  Make sure that all people in your household wash their hands well and often.  Cough or sneeze into a tissue or your sleeve or elbow. Do not cough or sneeze into your hand or into the air.  Wear a cloth face covering or face mask. Make sure your mask covers your nose and mouth. Where to find more information  Centers for Disease Control and Prevention: PurpleGadgets.be  World Health Organization: https://www.castaneda.info/ Contact a health care provider if:  You live in or have traveled to an  area where COVID-19 is a risk and you have symptoms of the infection.  You have had contact with someone who has COVID-19 and you have symptoms of the infection. Get help right away if:  You have trouble breathing.  You have pain or pressure in your chest.  You have confusion.  You have bluish lips and fingernails.  You have difficulty waking from sleep.  You have symptoms that get worse. These symptoms may represent a serious problem that is an emergency. Do not wait to see if the symptoms will go away. Get medical help right away. Call your local emergency services (911 in the [place] Do not drive yourself to the hospital.  Let the emergency medical personnel know if you think you have COVID-19. Summary  COVID-19 is a respiratory infection that is caused by a virus. It is also known as coronavirus disease or novel coronavirus. It can cause serious infections, such as pneumonia, acute respiratory distress syndrome, acute respiratory failure, or sepsis.  The virus that causes COVID-19 is contagious. This means that it can spread from person to person through droplets from breathing, speaking, singing, coughing, or sneezing.  You are more likely to develop a serious illness if you are 79 years of age or older, have a weak immune system, live in a nursing home, or have chronic disease.  There is no medicine to treat COVID-19. Your health care provider will talk with you about ways to treat your symptoms.  Take steps to protect yourself and others from infection. Wash your hands often and disinfect objects and surfaces that are frequently touched every day. Stay away from people who are sick and wear a mask if you are sick. This information is not intended to replace advice given to you by your health care provider. Make sure you discuss any questions you have with your health care provider. Document Revised: 03/06/2019 Document Reviewed: 06/12/2018 Elsevier Patient Education  [place] on my medicine - ELIQUIS (apixaban)  This medication education was reviewed with me or my healthcare representative as part of my discharge preparation.    Why was Eliquis prescribed for you? Eliquis was prescribed to treat blood clots that may have been found in the veins of your legs (deep vein thrombosis) or in your lungs (pulmonary embolism) and to reduce the risk of them occurring again.  What do You need to know about Eliquis ? The starting dose is 10 mg (two 5 mg tablets) taken TWICE daily for the FIRST SEVEN (7) DAYS, then on 9/22 @ 10 PM  the dose is reduced to ONE 5 mg tablet taken TWICE daily.  Eliquis may be taken with or without food.   Try to take the dose about the same time in the morning and in the evening. If you have difficulty swallowing the tablet whole please discuss with your pharmacist how to take the medication safely.  Take Eliquis exactly as prescribed and DO NOT stop taking Eliquis without talking to the doctor who prescribed the medication.  Stopping may increase your risk of developing a new blood clot.  Refill your prescription before you run out.  After discharge, you should have regular check-up appointments with your healthcare provider that is prescribing your Eliquis.    What do you do if you miss a dose? If a dose of ELIQUIS is not taken at the scheduled time, take it as soon as possible on the same day and twice-daily administration should be resumed. The dose should not be doubled to make up for a missed dose.  Important Safety Information A possible side effect of Eliquis is bleeding. You should call your healthcare provider right away if you experience any of the following: ? Bleeding from an injury or your nose that does not stop. ? Unusual colored urine (red or dark brown) or unusual colored stools (red or black). ? Unusual bruising for unknown reasons. ? A serious fall or if you hit your head (even if there is no  bleeding).  Some medicines may interact with Eliquis and might increase your risk of bleeding or clotting while on Eliquis. To help avoid this, consult your healthcare provider or pharmacist  prior to using any new prescription or non-prescription medications, including herbals, vitamins, non-steroidal anti-inflammatory drugs (NSAIDs) and supplements.  This website has more information on Eliquis (apixaban): http://www.eliquis.com/eliquis/home

## 2020-02-04 NOTE — Discharge Summary (Signed)
Physician Discharge Summary  Beverly Townsend:[id number] DOB: 04-Mar-1971 DOA: 02/02/2020  PCP: Martinique, Betty G, MD  Admit date: 02/02/2020 Discharge date: 02/04/2020  Admitted From: Home Disposition: Home  Recommendations for Outpatient Follow-up:  1. Follow up with PCP in 1-2 weeks 2. Please obtain BMP/CBC in one week 3. Patient will need to remain in quarantine until 02/23/2020 4. She has been set up for outpatient remdesivir for her last 2 doses.  Home Health: Equipment/Devices: Oxygen at 2 L  Discharge Condition: Stable CODE STATUS: Full code Diet recommendation: Heart healthy  Brief/Interim Summary: 49 year old female with a history of prediabetes, GERD presenting with approximately 2-week history of shortness of breath, generalized weakness, coughing with yellow-white sputum.  She stated that about 2 to 3 days prior to admission she lost her sense of taste and began having shortness of breath and worsening coughing.  She had a pulse oximeter at home and checked her oxygen saturation which measured 84-86%.  She stated that she had fevers up to 103 F at home.  She has not been vaccinated for COVID-19.  She has been using over-the-counter remedies including vitamin C and zinc..  However because of worsening shortness of breath and coughing and her desaturation she presented for further evaluation. In the emergency department, the patient was noted to be hypoxic with oxygen saturation 85% on room air.  She was placed on 4 L with improvement of her saturation to 96%.  BMP showed a sodium 134, potassium 3.4, serum creatinine 0.57.  AST 40, ALT 50, alk phosphatase 59, total bilirubin 0.9.  WBC 11.6, hemoglobin 11.9, platelets 1 21,000.  Chest x-ray showed bilateral interstitial infiltrates.  The patient was started on remdesivir and IV steroids.  Discharge Diagnoses:  Principal Problem:   Pneumonia due to COVID-19 virus Active Problems:   Obesity, unspecified   Acute respiratory  failure with hypoxia (HCC)   GERD (gastroesophageal reflux disease)   Hypokalemia   Thrombocytopenia (HCC)   Hypoalbuminemia   Prolonged QT interval   Class 2 obesity  Acute respiratory failure with hypoxia secondary to COVID-19 pneumonia -She completed 3 days of remdesivir in the hospital.  Remainder 2 doses will be set up as an outpatient -She was treated with intravenous Solu-Medrol and has been transitioned to prednisone -Continue vitamin C and zinc -Encourage prone position and incentive spirometry -CRP 24.8>22.8>8.8 -Ferritin 8110>315>9458 -D-dimer 3.15 -PCT 0.16 -Patient's oxygen was weaned down to 2 L.  She became hypoxic on ambulation.  She will be discharged with 2 L of oxygen.  Acute bilateral pulmonary emboli -due to COVID 19 infection -started on heparin infusion -transitioned to eliquis -no evidence of right heart strain on CT imaging -Echo is unremarkable, Pulmonary pressure 37 mmHg, RV normal  Hypokalemia -Repleted -Magnesium normal  Thrombocytosis -Likely acute phase reactant -Iron studies do not show any deficiency  Class II obesity -BMI 35.52 -At risk for morbidity and mortality from COVID-19 infection -Lifestyle modification  Impaired glucose tolerance -A1c 6.7 -We will need lifestyle modification versus Metformin (defer to pcp to discuss)  GERD -continue PPI  Discharge Instructions  Discharge Instructions    Diet - low sodium heart healthy   Complete by: As directed    Increase activity slowly   Complete by: As directed      Allergies as of 02/04/2020   No Known Allergies     Medication List    TAKE these medications   acetaminophen 500 MG tablet Commonly known as: TYLENOL Take 1,000 mg by mouth  every 6 (six) hours as needed for mild pain or fever.   albuterol 108 (90 Base) MCG/ACT inhaler Commonly known as: VENTOLIN HFA Inhale 2 puffs into the lungs every 4 (four) hours as needed for wheezing or shortness of breath.    apixaban 5 MG Tabs tablet Commonly known as: ELIQUIS Take 41m po bid for 7 days then 551mpo bid   dextromethorphan-guaiFENesin 30-600 MG 12hr tablet Commonly known as: MUCINEX DM Take 1 tablet by mouth 2 (two) times daily.   predniSONE 20 MG tablet Commonly known as: DELTASONE Take 2 tablets (40 mg total) by mouth daily with breakfast.   valACYclovir 1000 MG tablet Commonly known as: VALTREX TAKE 1 TABLET BY MOUTH DAILY FOR 5 DAYS.REPEAT TREATMENT FOR FUTURE LESIONS WITHIN 48 HOURS AND AS NEEDED What changed: See the new instructions.            Durable Medical Equipment  (From admission, onward)         Start     Ordered   02/04/20 1418  For home use only DME oxygen  Once       Question Answer Comment  Length of Need 6 Months   Mode or (Route) Nasal cannula   Liters per Minute 2   Frequency Continuous (stationary and portable oxygen unit needed)   Oxygen conserving device Yes   Oxygen delivery system Gas      02/04/20 1418          Follow-up Information    JoMartiniqueBetty G, MD. Schedule an appointment as soon as possible for a visit in 3 week(s).   Specialty: Family Medicine Contact information: [address] [phone]      Please refer to DC instructions for directions regarding remdesivir infusion appointments Follow up.        [facility] Follow up.   Why: Your oxygen is supplied by [employer]. Contact information: [address] [phone]            No Known Allergies  Consultations:     Procedures/Studies: CT ANGIO CHEST PE W OR WO CONTRAST  Result Date: 02/02/2020 CLINICAL DATA:  COVID positive, elevated D-dimer. Shortness of breath, low O2 sats. EXAM: CT ANGIOGRAPHY CHEST WITH CONTRAST TECHNIQUE: Multidetector CT imaging of the chest was performed using the standard protocol during bolus administration of intravenous contrast. Multiplanar  CT image reconstructions and MIPs were obtained to evaluate the vascular anatomy. CONTRAST:  10059mMNIPAQUE IOHEXOL 350 MG/ML SOLN COMPARISON:  Chest x-ray today FINDINGS: Cardiovascular: Filling defects are seen within bilateral lower lobe pulmonary arteries compatible with pulmonary emboli, small. No evidence of right heart strain. Heart is normal size. Aorta normal caliber. Mediastinum/Nodes: No mediastinal, hilar, or axillary adenopathy. Trachea and esophagus are unremarkable. Thyroid unremarkable. Lungs/Pleura: Extensive bilateral ground-glass airspace opacities, predominantly peripheral compatible with COVID pneumonia. No effusions. Upper Abdomen: Imaging into the upper abdomen demonstrates no acute findings. Musculoskeletal: Chest wall soft tissues are unremarkable. No acute bony abnormality. Review of the MIP images confirms the above findings. IMPRESSION: Small bilateral lower lobe pulmonary emboli. No evidence of right heart strain. Extensive peripheral ground-glass airspace opacities compatible with COVID pneumonia. These results will be called to the ordering clinician or representative by the Radiologist Assistant, and communication documented in the PACS or [employer]ically Signed   By: KevRolm BaptiseD.   On: 02/02/2020 09:21   [facility]  Result Date: 02/02/2020 CLINICAL DATA:  Cough and shortness of breath. EXAM: PORTABLE CHEST 1 VIEW COMPARISON:  November 12, 2015 FINDINGS: Mild ill-defined multifocal infiltrates are seen involving the bilateral lung bases and lateral aspect of the mid lung fields, right greater than left. There is no evidence of a pleural effusion or pneumothorax. The heart size and mediastinal contours are within normal limits. The visualized skeletal structures are unremarkable. IMPRESSION: Mild multifocal bilateral infiltrates. Electronically Signed   By: Virgina Norfolk M.D.   On: 02/02/2020 01:09   ECHOCARDIOGRAM COMPLETE  Result Date:  02/02/2020    ECHOCARDIOGRAM REPORT   Patient Name:   CORRY STORIE Date of Exam: 02/02/2020 Medical Rec #:  [id number]         Height:       61.0 in Accession #:    [id number]        Weight:       188.0 lb Date of Birth:  1971/04/11         BSA:          1.840 m Patient Age:    5 years          BP:           132/78 mmHg Patient Gender: F                 HR:           86 bpm. Exam Location:  [facility] Procedure: 2D Echo Indications:    Pulmonary Embolus 415.19 / I26.99  History:        Patient has no prior history of Echocardiogram examinations.                 Risk Factors:Non-Smoker. Pneumonia due to COVID-19 virus, GERD,                 Acute respiratory failure with hypoxia.  Sonographer:    Leavy Cella RDCS (AE) Referring Phys: [phone] DAVID TAT IMPRESSIONS  1. Left ventricular ejection fraction, by estimation, is 65 to 70%. The left ventricle has normal function. The left ventricle has no regional wall motion abnormalities. Left ventricular diastolic parameters were normal.  2. Right ventricular systolic function is normal. The right ventricular size is normal.  3. The mitral valve is normal in structure. Trivial mitral valve regurgitation. No evidence of mitral stenosis.  4. The aortic valve is tricuspid. Aortic valve regurgitation is not visualized. No aortic stenosis is present.  5. Mild pulmonary HTN, PASP is 37 mmHg.  6. The inferior vena cava is normal in size with greater than 50% respiratory variability, suggesting right atrial pressure of 3 mmHg. FINDINGS  Left Ventricle: Left ventricular ejection fraction, by estimation, is 65 to 70%. The left ventricle has normal function. The left ventricle has no regional wall motion abnormalities. The left ventricular internal cavity size was normal in size. There is  no left ventricular hypertrophy. Left ventricular diastolic parameters were normal. Right Ventricle: The right ventricular size is normal. No increase in right ventricular wall thickness. Right  ventricular systolic function is normal. Left Atrium: Left atrial size was normal in size. Right Atrium: Right atrial size was normal in size. Pericardium: There is no evidence of pericardial effusion. Mitral Valve: The mitral valve is normal in structure. Trivial mitral valve regurgitation. No evidence of mitral valve stenosis. Tricuspid Valve: The tricuspid valve is normal in structure. Tricuspid valve regurgitation is mild . No evidence of tricuspid stenosis. Aortic Valve: The aortic valve  is tricuspid. Aortic valve regurgitation is not visualized. No aortic stenosis is present. Aortic valve mean gradient measures 4.7 mmHg. Aortic valve peak gradient measures 8.2 mmHg. Aortic valve area, by VTI measures 1.73 cm. Pulmonic Valve: The pulmonic valve was not well visualized. Pulmonic valve regurgitation is not visualized. No evidence of pulmonic stenosis. Aorta: The aortic root is normal in size and structure. Pulmonary Artery: Mild pulmonary HTN, PASP is 37 mmHg. Venous: The inferior vena cava is normal in size with greater than 50% respiratory variability, suggesting right atrial pressure of 3 mmHg. IAS/Shunts: No atrial level shunt detected by color flow Doppler.  LEFT VENTRICLE PLAX 2D LVIDd:         3.65 cm  Diastology LVIDs:         2.42 cm  LV e' medial:    9.03 cm/s LV PW:         1.09 cm  LV E/e' medial:  8.9 LV IVS:        0.96 cm  LV e' lateral:   11.20 cm/s LVOT diam:     1.80 cm  LV E/e' lateral: 7.2 LV SV:         51 LV SV Index:   28 LVOT Area:     2.54 cm  RIGHT VENTRICLE RV S prime:     14.00 cm/s TAPSE (M-mode): 2.5 cm LEFT ATRIUM             Index       RIGHT ATRIUM           Index LA diam:        3.70 cm 2.01 cm/m  RA Area:     13.00 cm LA Vol (A2C):   64.4 ml 35.01 ml/m RA Volume:   35.40 ml  19.24 ml/m LA Vol (A4C):   40.5 ml 22.01 ml/m LA Biplane Vol: 51.2 ml 27.83 ml/m  AORTIC VALVE AV Area (Vmax):    1.99 cm AV Area (Vmean):   1.73 cm AV Area (VTI):     1.73 cm AV Vmax:            143.34 cm/s AV Vmean:          104.561 cm/s AV VTI:            0.295 m AV Peak Grad:      8.2 mmHg AV Mean Grad:      4.7 mmHg LVOT Vmax:         112.26 cm/s LVOT Vmean:        70.970 cm/s LVOT VTI:          0.200 m LVOT/AV VTI ratio: 0.68  AORTA Ao Root diam: 2.80 cm MITRAL VALVE               TRICUSPID VALVE MV Area (PHT): 3.93 cm    TR Peak grad:   43.8 mmHg MV Decel Time: 193 msec    TR Vmax:        331.00 cm/s MV E velocity: 80.60 cm/s MV A velocity: 45.80 cm/s  SHUNTS MV E/A ratio:  1.76        Systemic VTI:  0.20 m                            Systemic Diam: 1.80 cm Carlyle Dolly MD Electronically signed by Carlyle Dolly MD Signature Date/Time: 02/02/2020/12:36:58 PM    Final        Subjective: She  is feeling better.  Overall shortness of breath improving.  No chest pain.  Discharge Exam: Vitals:   02/04/20 0127 02/04/20 0555 02/04/20 0728 02/04/20 1310  BP:  92/69  113/69  Pulse:  86  97  Resp:  17  18  Temp:  97.9 F (36.6 C)  98.2 F (36.8 C)  TempSrc:  Oral  Oral  SpO2: 92% 90% 93% 97%  Weight:      Height:        General: Pt is alert, awake, not in acute distress Cardiovascular: RRR, S1/S2 +, no rubs, no gallops Respiratory: CTA bilaterally, no wheezing, no rhonchi Abdominal: Soft, NT, ND, bowel sounds + Extremities: no edema, no cyanosis    The results of significant diagnostics from this hospitalization (including imaging, microbiology, ancillary and laboratory) are listed below for reference.     Microbiology: Recent Results (from the past 240 hour(s))  SARS Coronavirus 2 by RT PCR (hospital order, performed in [facility] hospital lab) Nasopharyngeal Nasopharyngeal Swab     Status: Abnormal   Collection Time: 02/02/20 12:44 AM   Specimen: Nasopharyngeal Swab  Result Value Ref Range Status   SARS Coronavirus 2 POSITIVE (A) NEGATIVE Final    Comment: RESULT CALLED TO, READ BACK BY AND VERIFIED WITH: T WALKER,RN_0  02/02/20 MKELLY (NOTE) SARS-CoV-2 target  nucleic acids are DETECTED  SARS-CoV-2 RNA is generally detectable in upper respiratory specimens  during the acute phase of infection.  Positive results are indicative  of the presence of the identified virus, but do not rule out bacterial infection or co-infection with other pathogens not detected by the test.  Clinical correlation with patient history and  other diagnostic information is necessary to determine patient infection status.  The expected result is negative.  Fact Sheet for Patients:   StrictlyIdeas.no   Fact Sheet for Healthcare Providers:   BankingDealers.co.za    This test is not yet approved or cleared by the [place] FDA and  has been authorized for detection and/or diagnosis of SARS-CoV-2 by FDA under an Emergency Use Authorization (EUA).  This EUA will remain in effect (meaning this test  can be used) for the duration of  the COVID-19 declaration under Section 564(b)(1) of the Act, 21 U.S.C. section 360-bbb-3(b)(1), unless the authorization is terminated or revoked sooner.  Performed at [facility], [address] [place], [place] [address]   Blood Culture (routine x 2)     Status: None (Preliminary result)   Collection Time: 02/02/20  1:33 AM   Specimen: BLOOD LEFT HAND  Result Value Ref Range Status   Specimen Description BLOOD LEFT HAND  Final   Special Requests   Final    BOTTLES DRAWN AEROBIC AND ANAEROBIC Blood Culture adequate volume   Culture   Final    NO GROWTH 1 DAY Performed at [facility], [address] [place], [place] [address]    Report Status PENDING  Incomplete  Blood Culture (routine x 2)     Status: None (Preliminary result)   Collection Time: 02/02/20  1:51 AM   Specimen: BLOOD LEFT HAND  Result Value Ref Range Status   Specimen Description BLOOD LEFT HAND  Final   Special Requests   Final    BOTTLES DRAWN AEROBIC AND ANAEROBIC Blood Culture adequate volume   Culture   Final     NO GROWTH 1 DAY Performed at [facility], [address] [place], [place] [address]    Report Status PENDING  Incomplete  Labs: BNP (last 3 results) No results for input(s): BNP in the last 8760 hours. Basic Metabolic Panel: Recent Labs  Lab 02/02/20 0044 02/03/20 0031 02/04/20 0751  NA 134* 137 136  K 3.4* 4.0 4.0  CL 96* 101 101  CO2 _0 GLUCOSE 109* 110* 154*  BUN 10 36* 14  CREATININE 0.57 1.11* 0.55  CALCIUM 8.5* 7.6* 9.1  MG  --  2.3 2.2  PHOS  --  3.6 2.2*   Liver Function Tests: Recent Labs  Lab 02/02/20 0044 02/03/20 0031 02/04/20 0751  AST 40 19 26  ALT 50* 12 36  ALKPHOS 59 48 58  BILITOT 0.9 1.8* 0.5  PROT 7.5 5.5* 7.4  ALBUMIN 2.9* 3.0* 2.7*   No results for input(s): LIPASE, AMYLASE in the last 168 hours. No results for input(s): AMMONIA in the last 168 hours. CBC: Recent Labs  Lab 02/02/20 0044 02/03/20 0031 02/04/20 0751  WBC 11.6* 13.1* 17.1*  NEUTROABS 9.5* 11.5* 14.9*  HGB 11.8* 11.0* 11.9*  HCT 36.8 34.4* 37.9  MCV 96.6 96.6 96.7  PLT 421* 359 578*   Cardiac Enzymes: No results for input(s): CKTOTAL, CKMB, CKMBINDEX, TROPONINI in the last 168 hours. BNP: Invalid input(s): POCBNP CBG: No results for input(s): GLUCAP in the last 168 hours. D-Dimer Recent Labs    02/02/20 0044  DDIMER 3.15*   Hgb A1c Recent Labs    02/02/20 0151  HGBA1C 6.7*   Lipid Profile Recent Labs    02/02/20 0044  TRIG 95   Thyroid function studies No results for input(s): TSH, T4TOTAL, T3FREE, THYROIDAB in the last 72 hours.  Invalid input(s): FREET3 Anemia work up [employer]    02/03/20 0031 02/03/20 0031 02/03/20 0921 02/04/20 0751  FERRITIN 747*   < > 1,192* 1,092*  TIBC 163*  --   --   --   IRON 74  --   --   --    < > = values in this interval not displayed.   Urinalysis    Component Value Date/Time   COLORURINE yellow 07/18/2010 1029   APPEARANCEUR Clear 07/18/2010 1029   LABSPEC 1.020 07/18/2010 1029    PHURINE 5.0 07/18/2010 1029   GLUCOSEU NEGATIVE 06/16/2007 0914   HGBUR trace-intact 07/18/2010 1029   BILIRUBINUR n 04/27/2015 1227   KETONESUR NEGATIVE 06/16/2007 0914   PROTEINUR n 04/27/2015 1227   UROBILINOGEN 0.2 04/27/2015 1227   UROBILINOGEN 0.2 07/18/2010 1029   NITRITE n 04/27/2015 1227   NITRITE negative 07/18/2010 1029   LEUKOCYTESUR Negative 04/27/2015 1227   Sepsis Labs Invalid input(s): PROCALCITONIN,  WBC,  LACTICIDVEN Microbiology Recent Results (from the past 240 hour(s))  SARS Coronavirus 2 by RT PCR (hospital order, performed in [place] hospital lab) Nasopharyngeal Nasopharyngeal Swab     Status: Abnormal   Collection Time: 02/02/20 12:44 AM   Specimen: Nasopharyngeal Swab  Result Value Ref Range Status   SARS Coronavirus 2 POSITIVE (A) NEGATIVE Final    Comment: RESULT CALLED TO, READ BACK BY AND VERIFIED WITH: T WALKER,RN_1  02/02/20 MKELLY (NOTE) SARS-CoV-2 target nucleic acids are DETECTED  SARS-CoV-2 RNA is generally detectable in upper respiratory specimens  during the acute phase of infection.  Positive results are indicative  of the presence of the identified virus, but do not rule out bacterial infection or co-infection with other pathogens not detected by the test.  Clinical correlation with patient history and  other diagnostic information is necessary to determine patient infection status.  The expected  result is negative.  Fact Sheet for Patients:   StrictlyIdeas.no   Fact Sheet for Healthcare Providers:   BankingDealers.co.za    This test is not yet approved or cleared by the [place] FDA and  has been authorized for detection and/or diagnosis of SARS-CoV-2 by FDA under an Emergency Use Authorization (EUA).  This EUA will remain in effect (meaning this test  can be used) for the duration of  the COVID-19 declaration under Section 564(b)(1) of the Act, 21 U.S.C. section  360-bbb-3(b)(1), unless the authorization is terminated or revoked sooner.  Performed at [facility], [address] [place], [place] [address]   Blood Culture (routine x 2)     Status: None (Preliminary result)   Collection Time: 02/02/20  1:33 AM   Specimen: BLOOD LEFT HAND  Result Value Ref Range Status   Specimen Description BLOOD LEFT HAND  Final   Special Requests   Final    BOTTLES DRAWN AEROBIC AND ANAEROBIC Blood Culture adequate volume   Culture   Final    NO GROWTH 1 DAY Performed at [facility], [address] [place], [place] [address]    Report Status PENDING  Incomplete  Blood Culture (routine x 2)     Status: None (Preliminary result)   Collection Time: 02/02/20  1:51 AM   Specimen: BLOOD LEFT HAND  Result Value Ref Range Status   Specimen Description BLOOD LEFT HAND  Final   Special Requests   Final    BOTTLES DRAWN AEROBIC AND ANAEROBIC Blood Culture adequate volume   Culture   Final    NO GROWTH 1 DAY Performed at [facility], [address] [place], [place] [address]    Report Status PENDING  Incomplete     Time coordinating discharge: 39mns  SIGNED:   JKathie Dike MD  [facility] 02/04/2020, 5:33 PM   If 7PM-7AM, please contact night-coverage [email or web address]

## 2020-02-05 ENCOUNTER — Ambulatory Visit (HOSPITAL_COMMUNITY)
Admit: 2020-02-05 | Discharge: 2020-02-05 | Disposition: A | Discharge status: Home / Self Care | Payer: Self-pay | Source: Ambulatory Visit | Attending: Pulmonary Disease | Admitting: Pulmonary Disease

## 2020-02-05 ENCOUNTER — Telehealth: Payer: Self-pay | Admitting: Family Medicine

## 2020-02-05 DIAGNOSIS — U071 COVID-19: Secondary | ICD-10-CM | POA: Insufficient documentation

## 2020-02-05 DIAGNOSIS — J1282 Pneumonia due to coronavirus disease 2019: Secondary | ICD-10-CM | POA: Insufficient documentation

## 2020-02-05 MED ORDER — FAMOTIDINE IN NACL 20-0.9 MG/50ML-% IV SOLN: 20.0000 mg | Freq: Once | INTRAVENOUS | Status: DC | PRN

## 2020-02-05 MED ORDER — METHYLPREDNISOLONE SODIUM SUCC 125 MG IJ SOLR: 125.0000 mg | Freq: Once | INTRAMUSCULAR | Status: DC | PRN

## 2020-02-05 MED ORDER — DIPHENHYDRAMINE HCL 50 MG/ML IJ SOLN: 50.0000 mg | Freq: Once | INTRAMUSCULAR | Status: DC | PRN

## 2020-02-05 MED ORDER — SODIUM CHLORIDE 0.9 % IV SOLN
100.0000 mg | Freq: Once | INTRAVENOUS | Status: AC
  Administered 2020-02-05: 100 mg via INTRAVENOUS
  Filled 2020-02-05: qty 20

## 2020-02-05 MED ORDER — ALBUTEROL SULFATE HFA 108 (90 BASE) MCG/ACT IN AERS: 2.0000 | INHALATION_SPRAY | Freq: Once | RESPIRATORY_TRACT | Status: DC | PRN

## 2020-02-05 MED ORDER — EPINEPHRINE 0.3 MG/0.3ML IJ SOAJ: 0.3000 mg | Freq: Once | INTRAMUSCULAR | Status: DC | PRN

## 2020-02-05 MED ORDER — SODIUM CHLORIDE 0.9 % IV SOLN: INTRAVENOUS | Status: DC | PRN

## 2020-02-05 NOTE — Progress Notes (Signed)
  Diagnosis: COVID-19  Physician:DrWright   Procedure: [facility] Med: remdesivir infusion - Provided patient with remdesivir fact sheet for patients, parents and caregivers prior to infusion.  Complications: No immediate complications noted.  Discharge: Discharged home   Beverly Townsend 02/05/2020

## 2020-02-05 NOTE — Discharge Instructions (Signed)
10 Things You Can Do to Manage Your COVID-19 Symptoms at Home If you have possible or confirmed COVID-19: 1. Stay home from work and school. And stay away from other public places. If you must go out, avoid using any kind of public transportation, ridesharing, or taxis. 2. Monitor your symptoms carefully. If your symptoms get worse, call your healthcare provider immediately. 3. Get rest and stay hydrated. 4. If you have a medical appointment, call the healthcare provider ahead of time and tell them that you have or may have COVID-19. 5. For medical emergencies, call 911 and notify the dispatch personnel that you have or may have COVID-19. 6. Cover your cough and sneezes with a tissue or use the inside of your elbow. 7. Wash your hands often with soap and water for at least 20 seconds or clean your hands with an alcohol-based hand sanitizer that contains at least 60% alcohol. 8. As much as possible, stay in a specific room and away from other people in your home. Also, you should use a separate bathroom, if available. If you need to be around other people in or outside of the home, wear a mask. 9. Avoid sharing personal items with other people in your household, like dishes, towels, and bedding. 10. Clean all surfaces that are touched often, like counters, tabletops, and doorknobs. Use household cleaning sprays or wipes according to the label instructions. cdc.gov/coronavirus 11/19/2018 This information is not intended to replace advice given to you by your health care provider. Make sure you discuss any questions you have with your health care provider. Document Revised: 04/23/2019 Document Reviewed: 04/23/2019 Elsevier Patient Education  2020 Elsevier Inc.  

## 2020-02-05 NOTE — Telephone Encounter (Signed)
Transition Care Management Follow-up Telephone Call  Date of discharge and from where: 02/04/2020  How have you been since you were released from the hospital? Patient states she is doing ok  Any questions or concerns? No  Items Reviewed:  Did the pt receive and understand the discharge instructions provided? Yes   Medications obtained and verified? Yes   Any new allergies since your discharge? No   Dietary orders reviewed? Yes  Do you have support at home? Yes   Functional Questionnaire: (I = Independent and D = Dependent) ADLs: I  Bathing/Dressing- I  Meal Prep- I  Eating- I  Maintaining continence- I  Transferring/Ambulation- I  Managing Meds- I  Follow up appointments reviewed:   PCP Hospital f/u appt confirmed? Yes  Scheduled to see Dr. Martinique  on 03/07/2020 @ 10:30 am.  [facility] f/u appt confirmed? No    Are transportation arrangements needed? No   If their condition worsens, is the pt aware to call PCP or go to the Emergency Dept.? Yes  Was the patient provided with contact information for the PCP's office or ED? Yes  Was to pt encouraged to call back with questions or concerns? Yes

## 2020-02-06 ENCOUNTER — Ambulatory Visit (HOSPITAL_COMMUNITY)
Admit: 2020-02-06 | Discharge: 2020-02-06 | Disposition: A | Discharge status: Home / Self Care | Payer: Self-pay | Attending: Pulmonary Disease | Admitting: Pulmonary Disease

## 2020-02-06 DIAGNOSIS — U071 COVID-19: Secondary | ICD-10-CM | POA: Insufficient documentation

## 2020-02-06 MED ORDER — FAMOTIDINE IN NACL 20-0.9 MG/50ML-% IV SOLN: 20.0000 mg | Freq: Once | INTRAVENOUS | Status: DC | PRN

## 2020-02-06 MED ORDER — SODIUM CHLORIDE 0.9 % IV SOLN: INTRAVENOUS | Status: DC | PRN

## 2020-02-06 MED ORDER — EPINEPHRINE 0.3 MG/0.3ML IJ SOAJ: 0.3000 mg | Freq: Once | INTRAMUSCULAR | Status: DC | PRN

## 2020-02-06 MED ORDER — METHYLPREDNISOLONE SODIUM SUCC 125 MG IJ SOLR: 125.0000 mg | Freq: Once | INTRAMUSCULAR | Status: DC | PRN

## 2020-02-06 MED ORDER — ALBUTEROL SULFATE HFA 108 (90 BASE) MCG/ACT IN AERS: 2.0000 | INHALATION_SPRAY | Freq: Once | RESPIRATORY_TRACT | Status: DC | PRN

## 2020-02-06 MED ORDER — SODIUM CHLORIDE 0.9 % IV SOLN
100.0000 mg | Freq: Once | INTRAVENOUS | Status: AC
  Administered 2020-02-06: 100 mg via INTRAVENOUS
  Filled 2020-02-06: qty 20

## 2020-02-06 MED ORDER — DIPHENHYDRAMINE HCL 50 MG/ML IJ SOLN: 50.0000 mg | Freq: Once | INTRAMUSCULAR | Status: DC | PRN

## 2020-02-06 NOTE — Discharge Instructions (Signed)
10 Things You Can Do to Manage Your COVID-19 Symptoms at Home If you have possible or confirmed COVID-19: 1. Stay home from work and school. And stay away from other public places. If you must go out, avoid using any kind of public transportation, ridesharing, or taxis. 2. Monitor your symptoms carefully. If your symptoms get worse, call your healthcare provider immediately. 3. Get rest and stay hydrated. 4. If you have a medical appointment, call the healthcare provider ahead of time and tell them that you have or may have COVID-19. 5. For medical emergencies, call 911 and notify the dispatch personnel that you have or may have COVID-19. 6. Cover your cough and sneezes with a tissue or use the inside of your elbow. 7. Wash your hands often with soap and water for at least 20 seconds or clean your hands with an alcohol-based hand sanitizer that contains at least 60% alcohol. 8. As much as possible, stay in a specific room and away from other people in your home. Also, you should use a separate bathroom, if available. If you need to be around other people in or outside of the home, wear a mask. 9. Avoid sharing personal items with other people in your household, like dishes, towels, and bedding. 10. Clean all surfaces that are touched often, like counters, tabletops, and doorknobs. Use household cleaning sprays or wipes according to the label instructions. cdc.gov/coronavirus 11/19/2018 This information is not intended to replace advice given to you by your health care provider. Make sure you discuss any questions you have with your health care provider. Document Revised: 04/23/2019 Document Reviewed: 04/23/2019 Elsevier Patient Education  2020 Elsevier Inc.  

## 2020-02-06 NOTE — Progress Notes (Signed)
  Diagnosis: COVID-19  Physician:Dr Joya Gaskins   Procedure: [facility] Med: remdesivir infusion - Provided patient with remdesivir fact sheet for patients, parents and caregivers prior to infusion.  Complications: No immediate complications noted.  Discharge: Discharged home   Dewaine Oats 02/06/2020

## 2020-02-07 LAB — CULTURE, BLOOD (ROUTINE X 2)
Culture: NO GROWTH
Culture: NO GROWTH
Special Requests: ADEQUATE
Special Requests: ADEQUATE

## 2020-02-09 ENCOUNTER — Other Ambulatory Visit: Payer: Self-pay | Admitting: Family Medicine

## 2020-02-09 DIAGNOSIS — A6000 Herpesviral infection of urogenital system, unspecified: Secondary | ICD-10-CM

## 2020-03-01 ENCOUNTER — Ambulatory Visit (INDEPENDENT_AMBULATORY_CARE_PROVIDER_SITE_OTHER): Payer: Self-pay | Admitting: Family Medicine

## 2020-03-01 ENCOUNTER — Encounter: Payer: Self-pay | Admitting: Family Medicine

## 2020-03-01 ENCOUNTER — Other Ambulatory Visit: Payer: Self-pay

## 2020-03-01 VITALS — BP 118/70 | HR 99 | Resp 16 | Ht 61.0 in | Wt 191.2 lb

## 2020-03-01 DIAGNOSIS — I2699 Other pulmonary embolism without acute cor pulmonale: Secondary | ICD-10-CM

## 2020-03-01 DIAGNOSIS — Z6836 Body mass index (BMI) 36.0-36.9, adult: Secondary | ICD-10-CM

## 2020-03-01 DIAGNOSIS — E119 Type 2 diabetes mellitus without complications: Secondary | ICD-10-CM

## 2020-03-01 DIAGNOSIS — E1169 Type 2 diabetes mellitus with other specified complication: Secondary | ICD-10-CM | POA: Insufficient documentation

## 2020-03-01 MED ORDER — APIXABAN 5 MG PO TABS: ORAL_TABLET | ORAL | 2 refills | Status: DC

## 2020-03-01 NOTE — Progress Notes (Signed)
Medication Samples have been provided to the patient.  Drug name: Eliquis       Strength: 5mg         Qty: 14  LOT: [id number]  Exp.Date: 07/2021  Dosing instructions: Take 1 tablet by mouth twice daily.  X 3 boxes given to patient.   The patient has been instructed regarding the correct time, dose, and frequency of taking this medication, including desired effects and most common side effects.   Rodrigo Ran 12:50 PM 03/01/2020

## 2020-03-01 NOTE — Patient Instructions (Addendum)
A few things to remember from today's visit:  Controlled type 2 diabetes mellitus without complication, without long-term current use of insulin ([place]) - Plan: Amb Referral to Nutrition and Diabetic E  Acute pulmonary embolism without acute cor pulmonale, unspecified pulmonary embolism type (HCC)  Diabetes Mellitus and Exercise Exercising regularly is important for your overall health, especially when you have diabetes (diabetes mellitus). Exercising is not only about losing weight. It has many other health benefits, such as increasing muscle strength and bone density and reducing body fat and stress. This leads to improved fitness, flexibility, and endurance, all of which result in better overall health. Exercise has additional benefits for people with diabetes, including:  Reducing appetite.  Helping to lower and control blood glucose.  Lowering blood pressure.  Helping to control amounts of fatty substances (lipids) in the blood, such as cholesterol and triglycerides.  Helping the body to respond better to insulin (improving insulin sensitivity).  Reducing how much insulin the body needs.  Decreasing the risk for heart disease by: ? Lowering cholesterol and triglyceride levels. ? Increasing the levels of good cholesterol. ? Lowering blood glucose levels. What is my activity plan? Your health care provider or certified diabetes educator can help you make a plan for the type and frequency of exercise (activity plan) that works for you. Make sure that you:  Do at least 150 minutes of moderate-intensity or vigorous-intensity exercise each week. This could be brisk walking, biking, or water aerobics. ? Do stretching and strength exercises, such as yoga or weightlifting, at least 2 times a week. ? Spread out your activity over at least 3 days of the week.  Get some form of physical activity every day. ? Do not go more than 2 days in a row without some kind of physical activity. ? Avoid  being inactive for more than 30 minutes at a time. Take frequent breaks to walk or stretch.  Choose a type of exercise or activity that you enjoy, and set realistic goals.  Start slowly, and gradually increase the intensity of your exercise over time. What do I need to know about managing my diabetes?   Check your blood glucose before and after exercising. ? If your blood glucose is 240 mg/dL (13.3 mmol/L) or higher before you exercise, check your urine for ketones. If you have ketones in your urine, do not exercise until your blood glucose returns to normal. ? If your blood glucose is 100 mg/dL (5.6 mmol/L) or lower, eat a snack containing 15-20 grams of carbohydrate. Check your blood glucose 15 minutes after the snack to make sure that your level is above 100 mg/dL (5.6 mmol/L) before you start your exercise.  Know the symptoms of low blood glucose (hypoglycemia) and how to treat it. Your risk for hypoglycemia increases during and after exercise. Common symptoms of hypoglycemia can include: ? Hunger. ? Anxiety. ? Sweating and feeling clammy. ? Confusion. ? Dizziness or feeling light-headed. ? Increased heart rate or palpitations. ? Blurry vision. ? Tingling or numbness around the mouth, lips, or tongue. ? Tremors or shakes. ? Irritability.  Keep a rapid-acting carbohydrate snack available before, during, and after exercise to help prevent or treat hypoglycemia.  Avoid injecting insulin into areas of the body that are going to be exercised. For example, avoid injecting insulin into: ? The arms, when playing tennis. ? The legs, when jogging.  Keep records of your exercise habits. Doing this can help you and your health care provider adjust your diabetes  management plan as needed. Write down: ? Food that you eat before and after you exercise. ? Blood glucose levels before and after you exercise. ? The type and amount of exercise you have done. ? When your insulin is expected to peak,  if you use insulin. Avoid exercising at times when your insulin is peaking.  When you start a new exercise or activity, work with your health care provider to make sure the activity is safe for you, and to adjust your insulin, medicines, or food intake as needed.  Drink plenty of water while you exercise to prevent dehydration or heat stroke. Drink enough fluid to keep your urine clear or pale yellow. Summary  Exercising regularly is important for your overall health, especially when you have diabetes (diabetes mellitus).  Exercising has many health benefits, such as increasing muscle strength and bone density and reducing body fat and stress.  Your health care provider or certified diabetes educator can help you make a plan for the type and frequency of exercise (activity plan) that works for you.  When you start a new exercise or activity, work with your health care provider to make sure the activity is safe for you, and to adjust your insulin, medicines, or food intake as needed. This information is not intended to replace advice given to you by your health care provider. Make sure you discuss any questions you have with your health care provider. Document Revised: 11/29/2016 Document Reviewed: 10/17/2015 Elsevier Patient Education  [employer].  If you need refills please call your pharmacy. Do not use My Chart to request refills or for acute issues that need immediate attention.    Please be sure medication list is accurate. If a new problem present, please set up appointment sooner than planned today.

## 2020-03-01 NOTE — Progress Notes (Signed)
HPI: [name] is a 49 y.o. female, who is here today to follow on recent hospitalization. She was last seen 10/15/18.  Hospitalized from 02/02/20 to 02/04/20. She presented to the ER c/o SOB, fever 103 F,worsening cough,and weakness. During initial ER evaluation she was hypoxic, 85% O2 sat at RA. K+ initially mildly low at 3.4,AST 40,and ALT 50,platelets 121,000.  Dx'ed with COVID 19 pneumonia. She was on O2 supplementation 4 LPM.  Problem was complicated by bilateral PE. Received monoclonal ab infusion on 02/05/20.  02/02/20 chest CT: Small bilateral lower lobe pulmonary emboli. No evidence of right heart strain. Extensive peripheral ground-glass airspace opacities compatible with COVID pneumonia.  She is on Eliquis 5 mg bid. Tolerating medication well.  Still having some dry cough mainly at night. Negative for wheezing,SOB,palpitations,or CP.  DM II: Dx'ed in 2019. She is on non pharmacologic treatment. Negative for abdominal pain, nausea,vomiting, polydipsia,polyuria, or polyphagia.  Not checking BS's.  Lab Results  Component Value Date   HGBA1C 6.7 (H) 02/02/2020    Lab Results  Component Value Date   CREATININE 0.55 02/04/2020   BUN 14 02/04/2020   NA 136 02/04/2020   K 4.0 02/04/2020   CL 101 02/04/2020   CO2 24 02/04/2020   Lab Results  Component Value Date   WBC 17.1 (H) 02/04/2020   HGB 11.9 (L) 02/04/2020   HCT 37.9 02/04/2020   MCV 96.7 02/04/2020   PLT 578 (H) 02/04/2020   Review of Systems  Constitutional: Positive for fatigue. Negative for activity change, appetite change and fever.  HENT: Negative for mouth sores, nosebleeds and sore throat.   Eyes: Negative for redness and visual disturbance.  Gastrointestinal:       Negative for changes in bowel habits.  Genitourinary: Negative for decreased urine volume and hematuria.  Musculoskeletal: Negative for gait problem and myalgias.  Skin: Negative for rash and wound.    Neurological: Negative for syncope, weakness and headaches.  Rest see pertinent positives and negatives per HPI.  Current Outpatient Medications on File Prior to Visit  Medication Sig Dispense Refill  . acetaminophen (TYLENOL) 500 MG tablet Take 1,000 mg by mouth every 6 (six) hours as needed for mild pain or fever.    Marland Kitchen albuterol (VENTOLIN HFA) 108 (90 Base) MCG/ACT inhaler Inhale 2 puffs into the lungs every 4 (four) hours as needed for wheezing or shortness of breath. 18 g 0  . valACYclovir (VALTREX) 1000 MG tablet TAKE 1 TABLET BY MOUTH DAILY FOR 5 DAYS.REPEAT TREATMENT FOR FUTURE LESIONS WITHIN 48 HOURS AND AS NEEDED 20 tablet 0   No current facility-administered medications on file prior to visit.     Past Medical History:  Diagnosis Date  . Atypical chest pain    secondary to GERD  . GERD (gastroesophageal reflux disease)   . Head ache    Right sided   . Obesity    No Known Allergies  Social History   Socioeconomic History  . Marital status: Married    Spouse name: Not on file  . Number of children: 1  . Years of education: Not on file  . Highest education level: Not on file  Occupational History  . Occupation: CNA  Tobacco Use  . Smoking status: Never Smoker  . Smokeless tobacco: Never Used  Vaping Use  . Vaping Use: Never used  Substance and Sexual Activity  . Alcohol use: No    Alcohol/week: 0.0 standard drinks  . Drug use: No  .  Sexual activity: Not on file  Other Topics Concern  . Not on file  Social History Narrative   She is a CNA at [employer]    Married for 1 years    48 year old boy    She likes to cook and entertain.    Social Determinants of Health   Financial Resource Strain:   . Difficulty of Paying Living Expenses: Not on file  Food Insecurity:   . Worried About Charity fundraiser in the Last Year: Not on file  . Ran Out of Food in the Last Year: Not on file  Transportation Needs:   . Lack of Transportation (Medical): Not on file   . Lack of Transportation (Non-Medical): Not on file  Physical Activity:   . Days of Exercise per Week: Not on file  . Minutes of Exercise per Session: Not on file  Stress:   . Feeling of Stress : Not on file  Social Connections:   . Frequency of Communication with Friends and Family: Not on file  . Frequency of Social Gatherings with Friends and Family: Not on file  . Attends Religious Services: Not on file  . Active Member of Clubs or Organizations: Not on file  . Attends Archivist Meetings: Not on file  . Marital Status: Not on file   Vitals:   03/01/20 1146  BP: 118/70  Pulse: 99  Resp: 16  SpO2: 96%   Body mass index is 36.14 kg/m.  Physical Exam Vitals and nursing note reviewed.  Constitutional:      General: She is not in acute distress.    Appearance: She is well-developed.  HENT:     Head: Normocephalic and atraumatic.     Mouth/Throat:     Mouth: Mucous membranes are moist.     Pharynx: Oropharynx is clear.  Eyes:     Conjunctiva/sclera: Conjunctivae normal.     Pupils: Pupils are equal, round, and reactive to light.  Cardiovascular:     Rate and Rhythm: Normal rate and regular rhythm.     Pulses:          Dorsalis pedis pulses are 2+ on the right side and 2+ on the left side.     Heart sounds: No murmur heard.   Pulmonary:     Effort: Pulmonary effort is normal. No respiratory distress.     Breath sounds: Normal breath sounds.  Abdominal:     Palpations: Abdomen is soft. There is no hepatomegaly or mass.     Tenderness: There is no abdominal tenderness.  Lymphadenopathy:     Cervical: No cervical adenopathy.  Skin:    General: Skin is warm.     Findings: No erythema or rash.  Neurological:     General: No focal deficit present.     Mental Status: She is alert and oriented to person, place, and time.     Cranial Nerves: No cranial nerve deficit.     Gait: Gait normal.  Psychiatric:        Mood and Affect: Mood and affect normal.      Comments: Well groomed, good eye contact.   ASSESSMENT AND PLAN:  [name] was seen today for hospitalization follow-up.  Diagnoses and all orders for this visit:  Acute pulmonary embolism without acute cor pulmonale, unspecified pulmonary embolism type ([place]) We discussed Dx,prognosis,and treatment. First even related to COVID 19 infection. Will compete 3-4 months. She has no insurance and paying almost $ 500 for Rx.  Samples and coupon given today. Clearly instructed about warning signs.  -     apixaban (ELIQUIS) 5 MG TABS tablet; Take 10mg  po bid for 7 days then 5mg  po bid  Controlled type 2 diabetes mellitus without complication, without long-term current use of insulin ([place]) HgA1C is at goal. We discussed Dx,prognosis,and treatment options. She can continue non pharmacologic treatment.  -     Amb Referral to Nutrition and Diabetic E  Class 2 severe obesity due to excess calories with serious comorbidity and body mass index (BMI) of 36.0 to 36.9 in adult [facility]) Wt has been stable. We discussed benefits of wt loss as well as adverse effects of obesity. Consistency with healthy diet and physical activity recommended.  Clinically she has greatly improved. She agrees with holding on blood work until she gets Scientist, product/process development, planning on doing so in a couple of months.   Return in about 3 months (around 06/01/2020).   Hulet Ehrmann G. [place], MD  [facility]. [place] office.   A few things to remember from today's visit:  Controlled type 2 diabetes mellitus without complication, without long-term current use of insulin ([place]) - Plan: Amb Referral to Nutrition and Diabetic E  Acute pulmonary embolism without acute cor pulmonale, unspecified pulmonary embolism type (HCC)  Diabetes Mellitus and Exercise Exercising regularly is important for your overall health, especially when you have diabetes (diabetes mellitus). Exercising is not only about losing weight. It has many  other health benefits, such as increasing muscle strength and bone density and reducing body fat and stress. This leads to improved fitness, flexibility, and endurance, all of which result in better overall health. Exercise has additional benefits for people with diabetes, including:  Reducing appetite.  Helping to lower and control blood glucose.  Lowering blood pressure.  Helping to control amounts of fatty substances (lipids) in the blood, such as cholesterol and triglycerides.  Helping the body to respond better to insulin (improving insulin sensitivity).  Reducing how much insulin the body needs.  Decreasing the risk for heart disease by: ? Lowering cholesterol and triglyceride levels. ? Increasing the levels of good cholesterol. ? Lowering blood glucose levels. What is my activity plan? Your health care provider or certified diabetes educator can help you make a plan for the type and frequency of exercise (activity plan) that works for you. Make sure that you:  Do at least 150 minutes of moderate-intensity or vigorous-intensity exercise each week. This could be brisk walking, biking, or water aerobics. ? Do stretching and strength exercises, such as yoga or weightlifting, at least 2 times a week. ? Spread out your activity over at least 3 days of the week.  Get some form of physical activity every day. ? Do not go more than 2 days in a row without some kind of physical activity. ? Avoid being inactive for more than 30 minutes at a time. Take frequent breaks to walk or stretch.  Choose a type of exercise or activity that you enjoy, and set realistic goals.  Start slowly, and gradually increase the intensity of your exercise over time. What do I need to know about managing my diabetes?   Check your blood glucose before and after exercising. ? If your blood glucose is 240 mg/dL (13.3 mmol/L) or higher before you exercise, check your urine for ketones. If you have ketones in  your urine, do not exercise until your blood glucose returns to normal. ? If your blood glucose is 100 mg/dL (5.6 mmol/L)  or lower, eat a snack containing 15-20 grams of carbohydrate. Check your blood glucose 15 minutes after the snack to make sure that your level is above 100 mg/dL (5.6 mmol/L) before you start your exercise.  Know the symptoms of low blood glucose (hypoglycemia) and how to treat it. Your risk for hypoglycemia increases during and after exercise. Common symptoms of hypoglycemia can include: ? Hunger. ? Anxiety. ? Sweating and feeling clammy. ? Confusion. ? Dizziness or feeling light-headed. ? Increased heart rate or palpitations. ? Blurry vision. ? Tingling or numbness around the mouth, lips, or tongue. ? Tremors or shakes. ? Irritability.  Keep a rapid-acting carbohydrate snack available before, during, and after exercise to help prevent or treat hypoglycemia.  Avoid injecting insulin into areas of the body that are going to be exercised. For example, avoid injecting insulin into: ? The arms, when playing tennis. ? The legs, when jogging.  Keep records of your exercise habits. Doing this can help you and your health care provider adjust your diabetes management plan as needed. Write down: ? Food that you eat before and after you exercise. ? Blood glucose levels before and after you exercise. ? The type and amount of exercise you have done. ? When your insulin is expected to peak, if you use insulin. Avoid exercising at times when your insulin is peaking.  When you start a new exercise or activity, work with your health care provider to make sure the activity is safe for you, and to adjust your insulin, medicines, or food intake as needed.  Drink plenty of water while you exercise to prevent dehydration or heat stroke. Drink enough fluid to keep your urine clear or pale yellow. Summary  Exercising regularly is important for your overall health, especially when you  have diabetes (diabetes mellitus).  Exercising has many health benefits, such as increasing muscle strength and bone density and reducing body fat and stress.  Your health care provider or certified diabetes educator can help you make a plan for the type and frequency of exercise (activity plan) that works for you.  When you start a new exercise or activity, work with your health care provider to make sure the activity is safe for you, and to adjust your insulin, medicines, or food intake as needed. This information is not intended to replace advice given to you by your health care provider. Make sure you discuss any questions you have with your health care provider. Document Revised: 11/29/2016 Document Reviewed: 10/17/2015 Elsevier Patient Education  [employer].  If you need refills please call your pharmacy. Do not use My Chart to request refills or for acute issues that need immediate attention.    Please be sure medication list is accurate. If a new problem present, please set up appointment sooner than planned today.

## 2020-03-05 ENCOUNTER — Encounter: Payer: Self-pay | Admitting: Family Medicine

## 2020-03-07 ENCOUNTER — Inpatient Hospital Stay: Payer: Self-pay | Admitting: Family Medicine

## 2020-03-08 ENCOUNTER — Encounter: Payer: Self-pay | Attending: Family Medicine

## 2020-03-15 ENCOUNTER — Ambulatory Visit: Payer: Self-pay

## 2020-03-22 ENCOUNTER — Ambulatory Visit: Payer: Self-pay

## 2020-04-07 ENCOUNTER — Other Ambulatory Visit: Payer: Self-pay | Admitting: Family Medicine

## 2020-04-07 DIAGNOSIS — A6000 Herpesviral infection of urogenital system, unspecified: Secondary | ICD-10-CM

## 2020-06-01 ENCOUNTER — Ambulatory Visit: Payer: Self-pay | Admitting: Family Medicine

## 2020-06-12 NOTE — Progress Notes (Deleted)
HPI:  Ms.Ayla D Aung is a 50 y.o. female, who is here today for *** months follow up.   She was last seen on 03/01/20.  PE seen on CT on 02/02/20. She is on Eliquis 5 mg bid. PE was thought to be a complication of COVID 19 infection.  DM II: Dx'ed in 2019. Currently on non pharmacologic treatment. Negative for polydipsia,polyuria, or polyphagia.  Lab Results  Component Value Date   HGBA1C 6.7 (H) 02/02/2020  She is not on statin.  Lab Results  Component Value Date   CHOL 153 08/23/2017   HDL 49.30 08/23/2017   LDLCALC 95 08/23/2017   TRIG 95 02/02/2020   CHOLHDL 3 08/23/2017    {4+ HPI elements (or status of 3 or more chronic diseases)} ***  Review of Systems Rest of ROS, see pertinent positives sand negatives in HPI [review of 2 to 9 systems] ***  Current Outpatient Medications on File Prior to Visit  Medication Sig Dispense Refill  . acetaminophen (TYLENOL) 500 MG tablet Take 1,000 mg by mouth every 6 (six) hours as needed for mild pain or fever.    Marland Kitchen albuterol (VENTOLIN HFA) 108 (90 Base) MCG/ACT inhaler Inhale 2 puffs into the lungs every 4 (four) hours as needed for wheezing or shortness of breath. 18 g 0  . apixaban (ELIQUIS) 5 MG TABS tablet Take 10mg  po bid for 7 days then 5mg  po bid 60 tablet 2  . valACYclovir (VALTREX) 1000 MG tablet TAKE 1 TABLET BY MOUTH DAILY FOR 5 DAYS.REPEAT TREATMENT FOR FUTURE LESIONS WITHIN 48 HOURS AND AS NEEDED 20 tablet 0   No current facility-administered medications on file prior to visit.     Past Medical History:  Diagnosis Date  . Atypical chest pain    secondary to GERD  . GERD (gastroesophageal reflux disease)   . Head ache    Right sided   . Obesity    No Known Allergies  Social History   Socioeconomic History  . Marital status: Married    Spouse name: Not on file  . Number of children: 1  . Years of education: Not on file  . Highest education level: Not on file  Occupational History  .  Occupation: CNA  Tobacco Use  . Smoking status: Never Smoker  . Smokeless tobacco: Never Used  Vaping Use  . Vaping Use: Never used  Substance and Sexual Activity  . Alcohol use: No    Alcohol/week: 0.0 standard drinks  . Drug use: No  . Sexual activity: Not on file  Other Topics Concern  . Not on file  Social History Narrative   She is a CNA at [employer]    Married for 2 years    49 year old boy    She likes to cook and entertain.    Social Determinants of Health   Financial Resource Strain: Not on file  Food Insecurity: Not on file  Transportation Needs: Not on file  Physical Activity: Not on file  Stress: Not on file  Social Connections: Not on file    There were no vitals filed for this visit. There is no height or weight on file to calculate BMI.   Physical Exam  {[12+ exam elements]} ***  ASSESSMENT AND PLAN:   Ms. HANIN DECOOK was seen today for *** months follow-up.  No orders of the defined types were placed in this encounter.   No problem-specific Assessment & Plan notes found for this encounter.  No follow-ups on file.        -Ms. Zhaniya D Blankley was advised to return sooner than planned today if new concerns arise.       Lysha Schrade G. [place], MD  [facility]. [place] office.

## 2020-06-13 ENCOUNTER — Ambulatory Visit: Payer: Self-pay | Admitting: Family Medicine

## 2020-06-13 DIAGNOSIS — E119 Type 2 diabetes mellitus without complications: Secondary | ICD-10-CM

## 2020-06-13 DIAGNOSIS — I2699 Other pulmonary embolism without acute cor pulmonale: Secondary | ICD-10-CM

## 2020-06-13 DIAGNOSIS — Z0289 Encounter for other administrative examinations: Secondary | ICD-10-CM

## 2020-07-15 ENCOUNTER — Encounter: Payer: Self-pay | Admitting: Family Medicine

## 2020-07-15 ENCOUNTER — Ambulatory Visit (INDEPENDENT_AMBULATORY_CARE_PROVIDER_SITE_OTHER): Payer: Self-pay | Admitting: Family Medicine

## 2020-07-15 ENCOUNTER — Other Ambulatory Visit: Payer: Self-pay

## 2020-07-15 ENCOUNTER — Ambulatory Visit: Payer: Self-pay | Admitting: Family Medicine

## 2020-07-15 VITALS — BP 126/80 | HR 85 | Resp 16 | Ht 61.0 in | Wt 188.4 lb

## 2020-07-15 DIAGNOSIS — R519 Headache, unspecified: Secondary | ICD-10-CM

## 2020-07-15 DIAGNOSIS — E119 Type 2 diabetes mellitus without complications: Secondary | ICD-10-CM

## 2020-07-15 DIAGNOSIS — Z86711 Personal history of pulmonary embolism: Secondary | ICD-10-CM

## 2020-07-15 DIAGNOSIS — Z6836 Body mass index (BMI) 36.0-36.9, adult: Secondary | ICD-10-CM

## 2020-07-15 LAB — CBC
HCT: 37.9 % (ref 36.0–46.0)
Hemoglobin: 12.4 g/dL (ref 12.0–15.0)
MCHC: 32.7 g/dL (ref 30.0–36.0)
MCV: 95 fl (ref 78.0–100.0)
Platelets: 266 10*3/uL (ref 150.0–400.0)
RBC: 3.99 Mil/uL (ref 3.87–5.11)
RDW: 14.1 % (ref 11.5–15.5)
WBC: 5.7 10*3/uL (ref 4.0–10.5)

## 2020-07-15 LAB — BASIC METABOLIC PANEL
BUN: 13 mg/dL (ref 6–23)
CO2: 29 mEq/L (ref 19–32)
Calcium: 9.7 mg/dL (ref 8.4–10.5)
Chloride: 107 mEq/L (ref 96–112)
Creatinine, Ser: 0.58 mg/dL (ref 0.40–1.20)
GFR: 106.88 mL/min (ref >60.00)
Glucose, Bld: 97 mg/dL (ref 70–99)
Potassium: 4.5 mEq/L (ref 3.5–5.1)
Sodium: 140 mEq/L (ref 135–145)

## 2020-07-15 LAB — LIPID PANEL
Cholesterol: 179 mg/dL (ref 0–200)
HDL: 61 mg/dL (ref >39.00)
LDL Cholesterol: 105 mg/dL — ABNORMAL HIGH (ref 0–99)
NonHDL: 118.32
Total CHOL/HDL Ratio: 3
Triglycerides: 67 mg/dL (ref 0.0–149.0)
VLDL: 13.4 mg/dL (ref 0.0–40.0)

## 2020-07-15 LAB — POCT GLYCOSYLATED HEMOGLOBIN (HGB A1C): HbA1c, POC (prediabetic range): 6 % (ref 5.7–6.4)

## 2020-07-15 LAB — MICROALBUMIN / CREATININE URINE RATIO
Creatinine,U: 160.4 mg/dL
Microalb Creat Ratio: 0.6 mg/g (ref 0.0–30.0)
Microalb, Ur: 0.9 mg/dL (ref 0.0–1.9)

## 2020-07-15 MED ORDER — SUMATRIPTAN SUCCINATE 50 MG PO TABS: 50.0000 mg | ORAL_TABLET | Freq: Every day | ORAL | 0 refills | Status: DC | PRN

## 2020-07-15 NOTE — Patient Instructions (Addendum)
A few things to remember from today's visit:   Controlled type 2 diabetes mellitus without complication, without long-term current use of insulin ([place]) - Plan: POC HgB F0Y, Basic metabolic panel, CBC, Lipid panel, Microalbumin / creatinine urine ratio  Headache, unspecified headache type - Plan: SUMAtriptan (IMITREX) 50 MG tablet  If you need refills please call your pharmacy. Do not use My Chart to request refills or for acute issues that need immediate attention.   Headache sounds like migraine. Imitrix to take when you feel it coming.  Excedrin migraine x 1 when headache.  Migraine Headache A migraine headache is a very strong throbbing pain on one side or both sides of your head. This type of headache can also cause other symptoms. It can last from 4 hours to 3 days. Talk with your doctor about what things may bring on (trigger) this condition. What are the causes? The exact cause of this condition is not known. This condition may be triggered or caused by:  Drinking alcohol.  Smoking.  Taking medicines, such as: ? Medicine used to treat chest pain (nitroglycerin). ? Birth control pills. ? Estrogen. ? Some blood pressure medicines.  Eating or drinking certain products.  Doing physical activity. Other things that may trigger a migraine headache include:  Having a menstrual period.  Pregnancy.  Hunger.  Stress.  Not getting enough sleep or getting too much sleep.  Weather changes.  Tiredness (fatigue). What increases the risk?  Being 96-68 years old.  Being female.  Having a family history of migraine headaches.  Being Caucasian.  Having depression or anxiety.  Being very overweight. What are the signs or symptoms?  A throbbing pain. This pain may: ? Happen in any area of the head, such as on one side or both sides. ? Make it hard to do daily activities. ? Get worse with physical activity. ? Get worse around bright lights or loud noises.  Other  symptoms may include: ? Feeling sick to your stomach (nauseous). ? Vomiting. ? Dizziness. ? Being sensitive to bright lights, loud noises, or smells.  Before you get a migraine headache, you may get warning signs (an aura). An aura may include: ? Seeing flashing lights or having blind spots. ? Seeing bright spots, halos, or zigzag lines. ? Having tunnel vision or blurred vision. ? Having numbness or a tingling feeling. ? Having trouble talking. ? Having weak muscles.  Some people have symptoms after a migraine headache (postdromal phase), such as: ? Tiredness. ? Trouble thinking (concentrating). How is this treated?  Taking medicines that: ? Relieve pain. ? Relieve the feeling of being sick to your stomach. ? Prevent migraine headaches.  Treatment may also include: ? Having acupuncture. ? Avoiding foods that bring on migraine headaches. ? Learning ways to control your body functions (biofeedback). ? Therapy to help you know and deal with negative thoughts (cognitive behavioral therapy). Follow these instructions at home: Medicines  Take over-the-counter and prescription medicines only as told by your doctor.  Ask your doctor if the medicine prescribed to you: ? Requires you to avoid driving or using heavy machinery. ? Can cause trouble pooping (constipation). You may need to take these steps to prevent or treat trouble pooping:  Drink enough fluid to keep your pee (urine) pale yellow.  Take over-the-counter or prescription medicines.  Eat foods that are high in fiber. These include beans, whole grains, and fresh fruits and vegetables.  Limit foods that are high in fat and sugar. These include  fried or sweet foods. Lifestyle  Do not drink alcohol.  Do not use any products that contain nicotine or tobacco, such as cigarettes, e-cigarettes, and chewing tobacco. If you need help quitting, ask your doctor.  Get at least 8 hours of sleep every night.  Limit and deal  with stress. General instructions  Keep a journal to find out what may bring on your migraine headaches. For example, write down: ? What you eat and drink. ? How much sleep you get. ? Any change in what you eat or drink. ? Any change in your medicines.  If you have a migraine headache: ? Avoid things that make your symptoms worse, such as bright lights. ? It may help to lie down in a dark, quiet room. ? Do not drive or use heavy machinery. ? Ask your doctor what activities are safe for you.  Keep all follow-up visits as told by your doctor. This is important.      Contact a doctor if:  You get a migraine headache that is different or worse than others you have had.  You have more than 15 headache days in one month. Get help right away if:  Your migraine headache gets very bad.  Your migraine headache lasts longer than 72 hours.  You have a fever.  You have a stiff neck.  You have trouble seeing.  Your muscles feel weak or like you cannot control them.  You start to lose your balance a lot.  You start to have trouble walking.  You pass out (faint).  You have a seizure. Summary  A migraine headache is a very strong throbbing pain on one side or both sides of your head. These headaches can also cause other symptoms.  This condition may be treated with medicines and changes to your lifestyle.  Keep a journal to find out what may bring on your migraine headaches.  Contact a doctor if you get a migraine headache that is different or worse than others you have had.  Contact your doctor if you have more than 15 headache days in a month. This information is not intended to replace advice given to you by your health care provider. Make sure you discuss any questions you have with your health care provider. Document Revised: 08/29/2018 Document Reviewed: 06/19/2018 Elsevier Patient Education  2021 [place].  Please be sure medication list is accurate. If a new  problem present, please set up appointment sooner than planned today.

## 2020-07-15 NOTE — Progress Notes (Signed)
HPI:  Beverly Townsend is a 50 y.o. female, who is here today for 4 months follow up.   She was last seen on 03/01/20 for hospitalization follow up. Hospitalized with Dx of COVID 19 pneumonia complicated by PE. 08/30/85 chest CT: Small bilateral lower lobe pulmonary emboli. No evidence of right heart strain. Extensive peripheral ground-glass airspace opacities compatible with COVID pneumonia. She is taking Eliquis 5 mg bid.  DM II: Dx'ed in 2019. She is on non pharmacologic treatment. BS's 120's. She is planning on seeing eye care provider this weekend. 02/02/20 HgA1C was 6.7.  Lab Results  Component Value Date   CREATININE 0.55 02/04/2020   BUN 14 02/04/2020   NA 136 02/04/2020   K 4.0 02/04/2020   CL 101 02/04/2020   CO2 24 02/04/2020   She had an episode  rapid heart beat a couple of weeks ago while sitting down. Negative for CP,SOB,diaphoresis,or dizziness.  Lab Results  Component Value Date   CHOL 153 08/23/2017   HDL 49.30 08/23/2017   LDLCALC 95 08/23/2017   TRIG 95 02/02/2020   CHOLHDL 3 08/23/2017   Headache right-sided, fronto temporal headaches. She has had headache but this one seems to be different, same localization but Tylenol used to helped. Last 2-3 days. Throbbing headache. No associated photophobia,nausea,or vomiting. She has 3 episodes in 2 months. It is gradual.  Lab Results  Component Value Date   WBC 17.1 (H) 02/04/2020   HGB 11.9 (L) 02/04/2020   HCT 37.9 02/04/2020   MCV 96.7 02/04/2020   PLT 578 (H) 02/04/2020   She is more active, back to work, so walking more. Trying to eat more fish and avoid red meat and pork.  Review of Systems  Constitutional: Negative for activity change, appetite change and fever.  HENT: Negative for mouth sores, nosebleeds and sore throat.   Respiratory: Negative for cough and wheezing.   Cardiovascular: Negative for leg swelling.  Gastrointestinal: Negative for abdominal pain.       Negative  for changes in bowel habits.  Genitourinary: Negative for decreased urine volume, dysuria and hematuria.  Neurological: Negative for syncope, facial asymmetry, weakness and numbness.  Rest of ROS, see pertinent positives sand negatives in HPI  Current Outpatient Medications on File Prior to Visit  Medication Sig Dispense Refill  . acetaminophen (TYLENOL) 500 MG tablet Take 1,000 mg by mouth every 6 (six) hours as needed for mild pain or fever.    Marland Kitchen albuterol (VENTOLIN HFA) 108 (90 Base) MCG/ACT inhaler Inhale 2 puffs into the lungs every 4 (four) hours as needed for wheezing or shortness of breath. 18 g 0  . apixaban (ELIQUIS) 5 MG TABS tablet Take 10mg  po bid for 7 days then 5mg  po bid 60 tablet 2  . valACYclovir (VALTREX) 1000 MG tablet TAKE 1 TABLET BY MOUTH DAILY FOR 5 DAYS.REPEAT TREATMENT FOR FUTURE LESIONS WITHIN 48 HOURS AND AS NEEDED 20 tablet 0   No current facility-administered medications on file prior to visit.   Past Medical History:  Diagnosis Date  . Atypical chest pain    secondary to GERD  . GERD (gastroesophageal reflux disease)   . Head ache    Right sided   . Obesity    No Known Allergies  Social History   Socioeconomic History  . Marital status: Married    Spouse name: Not on file  . Number of children: 1  . Years of education: Not on file  . Highest education level:  Not on file  Occupational History  . Occupation: CNA  Tobacco Use  . Smoking status: Never Smoker  . Smokeless tobacco: Never Used  Vaping Use  . Vaping Use: Never used  Substance and Sexual Activity  . Alcohol use: No    Alcohol/week: 0.0 standard drinks  . Drug use: No  . Sexual activity: Not on file  Other Topics Concern  . Not on file  Social History Narrative   She is a CNA at [employer]    Married for 71 years    49 year old boy    She likes to cook and entertain.    Social Determinants of Health   Financial Resource Strain: Not on file  Food Insecurity: Not on file   Transportation Needs: Not on file  Physical Activity: Not on file  Stress: Not on file  Social Connections: Not on file   Vitals:   07/15/20 0815  BP: 126/80  Pulse: 85  Resp: 16  SpO2: 94%   Body mass index is 35.59 kg/m.  Physical Exam Vitals and nursing note reviewed.  Constitutional:      General: She is not in acute distress.    Appearance: She is well-developed.  HENT:     Head: Normocephalic and atraumatic.     Mouth/Throat:     Mouth: Oropharynx is clear and moist and mucous membranes are normal. Mucous membranes are moist.     Pharynx: Oropharynx is clear.  Eyes:     Conjunctiva/sclera: Conjunctivae normal.     Pupils: Pupils are equal, round, and reactive to light.  Cardiovascular:     Rate and Rhythm: Normal rate and regular rhythm.     Pulses:          Dorsalis pedis pulses are 2+ on the right side and 2+ on the left side.     Heart sounds: No murmur heard.   Pulmonary:     Effort: Pulmonary effort is normal. No respiratory distress.     Breath sounds: Normal breath sounds.  Abdominal:     Palpations: Abdomen is soft. There is no hepatomegaly or mass.     Tenderness: There is no abdominal tenderness.  Musculoskeletal:        General: No edema.  Lymphadenopathy:     Cervical: No cervical adenopathy.  Skin:    General: Skin is warm.     Findings: No erythema or rash.  Neurological:     General: No focal deficit present.     Mental Status: She is alert and oriented to person, place, and time.     Cranial Nerves: No cranial nerve deficit.     Gait: Gait normal.     Deep Tendon Reflexes: Strength normal.  Psychiatric:        Mood and Affect: Mood and affect normal.     Comments: Well groomed, good eye contact.    Diabetic Foot Exam - Simple   Simple Foot Form Diabetic Foot exam was performed with the following findings: Yes 07/15/2020  8:01 PM  Visual Inspection No deformities, no ulcerations, no other skin breakdown bilaterally: Yes Sensation  Testing Intact to touch and monofilament testing bilaterally: Yes Pulse Check Posterior Tibialis and Dorsalis pulse intact bilaterally: Yes Comments   ASSESSMENT AND PLAN:  Beverly Townsend was seen today for 4 months follow-up.  Orders Placed This Encounter  Procedures  . Basic metabolic panel  . CBC  . Lipid panel  . Microalbumin / creatinine urine ratio  .  POC HgB A1c    Lab Results  Component Value Date   WBC 5.7 07/15/2020   HGB 12.4 07/15/2020   HCT 37.9 07/15/2020   MCV 95.0 07/15/2020   PLT 266.0 07/15/2020   Lab Results  Component Value Date   CHOL 179 07/15/2020   HDL 61.00 07/15/2020   LDLCALC 105 (H) 07/15/2020   TRIG 67.0 07/15/2020   CHOLHDL 3 07/15/2020   Lab Results  Component Value Date   MICROALBUR 0.9 07/15/2020   Controlled type 2 diabetes mellitus without complication, without long-term current use of insulin ([place]) HgA1C at goal. Continue non pharmacologic treatment. Annual eye exam, periodic dental and foot care recommended. F/U in 5-6 months  Headache, unspecified headache type Hx suggest migraine headache. She is not having frequent or debilitating episodes, so for now we will hold on prophylactic treatment. Imitrex as needed. Excedrin migraine daily as needed. I do not think imaging is needed at this time.  -     SUMAtriptan (IMITREX) 50 MG tablet; Take 1 tablet (50 mg total) by mouth daily as needed for migraine. May repeat in 2 hours if headache persists or recurs.  Class 2 severe obesity due to excess calories with serious comorbidity and body mass index (BMI) of 36.0 to 36.9 in adult [facility]) She understands the benefits of wt loss as well as adverse effects of obesity. Consistency with healthy diet and physical activity recommended.  Hx of pulmonary embolus Completed 3-4 months of Eliquis, she can stop now. We discussed recommendations in regard to PE/DVT.  Return in about 6 months (around 01/12/2021).   Beverly Ditto G. [place],  MD  [facility]. [place] office.   A few things to remember from today's visit:   Controlled type 2 diabetes mellitus without complication, without long-term current use of insulin ([place]) - Plan: POC HgB I3K, Basic metabolic panel, CBC, Lipid panel, Microalbumin / creatinine urine ratio  Headache, unspecified headache type - Plan: SUMAtriptan (IMITREX) 50 MG tablet  If you need refills please call your pharmacy. Do not use My Chart to request refills or for acute issues that need immediate attention.   Headache sounds like migraine. Imitrix to take when you feel it coming.  Excedrin migraine x 1 when headache.  Migraine Headache A migraine headache is a very strong throbbing pain on one side or both sides of your head. This type of headache can also cause other symptoms. It can last from 4 hours to 3 days. Talk with your doctor about what things may bring on (trigger) this condition. What are the causes? The exact cause of this condition is not known. This condition may be triggered or caused by:  Drinking alcohol.  Smoking.  Taking medicines, such as: ? Medicine used to treat chest pain (nitroglycerin). ? Birth control pills. ? Estrogen. ? Some blood pressure medicines.  Eating or drinking certain products.  Doing physical activity. Other things that may trigger a migraine headache include:  Having a menstrual period.  Pregnancy.  Hunger.  Stress.  Not getting enough sleep or getting too much sleep.  Weather changes.  Tiredness (fatigue). What increases the risk?  Being 45-56 years old.  Being female.  Having a family history of migraine headaches.  Being Caucasian.  Having depression or anxiety.  Being very overweight. What are the signs or symptoms?  A throbbing pain. This pain may: ? Happen in any area of the head, such as on one side or both sides. ? Make it hard to  do daily activities. ? Get worse with physical activity. ? Get  worse around bright lights or loud noises.  Other symptoms may include: ? Feeling sick to your stomach (nauseous). ? Vomiting. ? Dizziness. ? Being sensitive to bright lights, loud noises, or smells.  Before you get a migraine headache, you may get warning signs (an aura). An aura may include: ? Seeing flashing lights or having blind spots. ? Seeing bright spots, halos, or zigzag lines. ? Having tunnel vision or blurred vision. ? Having numbness or a tingling feeling. ? Having trouble talking. ? Having weak muscles.  Some people have symptoms after a migraine headache (postdromal phase), such as: ? Tiredness. ? Trouble thinking (concentrating). How is this treated?  Taking medicines that: ? Relieve pain. ? Relieve the feeling of being sick to your stomach. ? Prevent migraine headaches.  Treatment may also include: ? Having acupuncture. ? Avoiding foods that bring on migraine headaches. ? Learning ways to control your body functions (biofeedback). ? Therapy to help you know and deal with negative thoughts (cognitive behavioral therapy). Follow these instructions at home: Medicines  Take over-the-counter and prescription medicines only as told by your doctor.  Ask your doctor if the medicine prescribed to you: ? Requires you to avoid driving or using heavy machinery. ? Can cause trouble pooping (constipation). You may need to take these steps to prevent or treat trouble pooping:  Drink enough fluid to keep your pee (urine) pale yellow.  Take over-the-counter or prescription medicines.  Eat foods that are high in fiber. These include beans, whole grains, and fresh fruits and vegetables.  Limit foods that are high in fat and sugar. These include fried or sweet foods. Lifestyle  Do not drink alcohol.  Do not use any products that contain nicotine or tobacco, such as cigarettes, e-cigarettes, and chewing tobacco. If you need help quitting, ask your doctor.  Get at least  8 hours of sleep every night.  Limit and deal with stress. General instructions  Keep a journal to find out what may bring on your migraine headaches. For example, write down: ? What you eat and drink. ? How much sleep you get. ? Any change in what you eat or drink. ? Any change in your medicines.  If you have a migraine headache: ? Avoid things that make your symptoms worse, such as bright lights. ? It may help to lie down in a dark, quiet room. ? Do not drive or use heavy machinery. ? Ask your doctor what activities are safe for you.  Keep all follow-up visits as told by your doctor. This is important.      Contact a doctor if:  You get a migraine headache that is different or worse than others you have had.  You have more than 15 headache days in one month. Get help right away if:  Your migraine headache gets very bad.  Your migraine headache lasts longer than 72 hours.  You have a fever.  You have a stiff neck.  You have trouble seeing.  Your muscles feel weak or like you cannot control them.  You start to lose your balance a lot.  You start to have trouble walking.  You pass out (faint).  You have a seizure. Summary  A migraine headache is a very strong throbbing pain on one side or both sides of your head. These headaches can also cause other symptoms.  This condition may be treated with medicines and changes to your lifestyle.  Keep a journal to find out what may bring on your migraine headaches.  Contact a doctor if you get a migraine headache that is different or worse than others you have had.  Contact your doctor if you have more than 15 headache days in a month. This information is not intended to replace advice given to you by your health care provider. Make sure you discuss any questions you have with your health care provider. Document Revised: 08/29/2018 Document Reviewed: 06/19/2018 Elsevier Patient Education  2021 [place].  Please  be sure medication list is accurate. If a new problem present, please set up appointment sooner than planned today.

## 2020-07-20 ENCOUNTER — Other Ambulatory Visit: Payer: Self-pay

## 2020-07-20 MED ORDER — PRAVASTATIN SODIUM 20 MG PO TABS: 20.0000 mg | ORAL_TABLET | Freq: Every day | ORAL | 3 refills | Status: DC

## 2020-11-30 ENCOUNTER — Ambulatory Visit (INDEPENDENT_AMBULATORY_CARE_PROVIDER_SITE_OTHER): Payer: Self-pay | Admitting: Family Medicine

## 2020-11-30 ENCOUNTER — Encounter: Payer: Self-pay | Admitting: Family Medicine

## 2020-11-30 ENCOUNTER — Other Ambulatory Visit: Payer: Self-pay

## 2020-11-30 VITALS — BP 128/80 | HR 82 | Resp 16 | Ht 61.0 in | Wt 184.1 lb

## 2020-11-30 DIAGNOSIS — J302 Other seasonal allergic rhinitis: Secondary | ICD-10-CM

## 2020-11-30 DIAGNOSIS — E119 Type 2 diabetes mellitus without complications: Secondary | ICD-10-CM

## 2020-11-30 DIAGNOSIS — E785 Hyperlipidemia, unspecified: Secondary | ICD-10-CM

## 2020-11-30 DIAGNOSIS — E1169 Type 2 diabetes mellitus with other specified complication: Secondary | ICD-10-CM

## 2020-11-30 LAB — LIPID PANEL
Cholesterol: 181 mg/dL (ref 0–200)
HDL: 53.1 mg/dL (ref >39.00)
LDL Cholesterol: 117 mg/dL — ABNORMAL HIGH (ref 0–99)
NonHDL: 128.31
Total CHOL/HDL Ratio: 3
Triglycerides: 57 mg/dL (ref 0.0–149.0)
VLDL: 11.4 mg/dL (ref 0.0–40.0)

## 2020-11-30 LAB — HEMOGLOBIN A1C: Hgb A1c MFr Bld: 6.5 % (ref 4.6–6.5)

## 2020-11-30 MED ORDER — FLUTICASONE PROPIONATE 50 MCG/ACT NA SUSP: 1.0000 | Freq: Two times a day (BID) | NASAL | 3 refills | Status: AC

## 2020-11-30 NOTE — Progress Notes (Signed)
HPI: [name] is a 50 y.o. female, who is here today for 5-6 months follow up.   She was last seen on 07/15/20. DM II: Dx'ed in 2019. Negative for abdominal pain, nausea,vomiting, polydipsia,polyuria, or polyphagia. She is on non pharmacologic treatment. Lab Results  Component Value Date   HGBA1C 6.0 07/15/2020  She is checking BS's 90's.  HLD: She is on Pravastatin 20 mg daily. Tolerating medication well. She is not exercising regularly and has not been consistent with following a healthful diet.  Lab Results  Component Value Date   CHOL 179 07/15/2020   HDL 61.00 07/15/2020   LDLCALC 105 (H) 07/15/2020   TRIG 67.0 07/15/2020   CHOLHDL 3 07/15/2020   -For about a week productive cough with clear sputum. Nasal congestion,rhinorrhea,sinus pressure,and ears "popping." + Sneezing. No sick contact. She has not had fever,chills,SOB,or wheezing. Negative for anosmia or ageusia. OTC Sudafed did not seem to help.  Review of Systems  Constitutional:  Negative for activity change, appetite change and fatigue.  HENT:  Negative for ear discharge, ear pain, hearing loss, mouth sores, nosebleeds and sore throat.   Eyes:  Negative for redness and visual disturbance.  Cardiovascular:  Negative for chest pain, palpitations and leg swelling.  Gastrointestinal:        Negative for changes in bowel habits.  Genitourinary:  Negative for decreased urine volume, dysuria and hematuria.  Skin:  Negative for pallor and rash.  Neurological:  Negative for syncope, facial asymmetry, weakness and numbness.  Rest of ROS, see pertinent positives sand negatives in HPI  Current Outpatient Medications on File Prior to Visit  Medication Sig Dispense Refill   acetaminophen (TYLENOL) 500 MG tablet Take 1,000 mg by mouth every 6 (six) hours as needed for mild pain or fever.     albuterol (VENTOLIN HFA) 108 (90 Base) MCG/ACT inhaler Inhale 2 puffs into the lungs every 4 (four) hours as  needed for wheezing or shortness of breath. 18 g 0   pravastatin (PRAVACHOL) 20 MG tablet Take 1 tablet (20 mg total) by mouth daily. 30 tablet 3   SUMAtriptan (IMITREX) 50 MG tablet Take 1 tablet (50 mg total) by mouth daily as needed for migraine. May repeat in 2 hours if headache persists or recurs. 10 tablet 0   valACYclovir (VALTREX) 1000 MG tablet TAKE 1 TABLET BY MOUTH DAILY FOR 5 DAYS.REPEAT TREATMENT FOR FUTURE LESIONS WITHIN 48 HOURS AND AS NEEDED 20 tablet 0   No current facility-administered medications on file prior to visit.   Past Medical History:  Diagnosis Date   Atypical chest pain    secondary to GERD   GERD (gastroesophageal reflux disease)    Head ache    Right sided    Obesity    No Known Allergies  Social History   Socioeconomic History   Marital status: Married    Spouse name: Not on file   Number of children: 1   Years of education: Not on file   Highest education level: Not on file  Occupational History   Occupation: CNA  Tobacco Use   Smoking status: Never   Smokeless tobacco: Never  Vaping Use   Vaping Use: Never used  Substance and Sexual Activity   Alcohol use: No    Alcohol/week: 0.0 standard drinks   Drug use: No   Sexual activity: Not on file  Other Topics Concern   Not on file  Social History Narrative   She is a Quarry manager  at [employer]    Married for 61 years    49 year old boy    She likes to cook and entertain.    Social Determinants of Health   Financial Resource Strain: Not on file  Food Insecurity: Not on file  Transportation Needs: Not on file  Physical Activity: Not on file  Stress: Not on file  Social Connections: Not on file    Vitals:   11/30/20 1017  BP: 128/80  Pulse: 82  Resp: 16  SpO2: 98%   Body mass index is 34.79 kg/m.  Physical Exam Vitals and nursing note reviewed.  Constitutional:      General: She is not in acute distress.    Appearance: She is well-developed.  HENT:     Head: Normocephalic and  atraumatic.     Right Ear: Tympanic membrane, ear canal and external ear normal.     Left Ear: Tympanic membrane, ear canal and external ear normal.     Nose:     Right Turbinates: Enlarged.     Left Turbinates: Enlarged.     Mouth/Throat:     Mouth: Mucous membranes are moist.     Pharynx: Oropharynx is clear.  Eyes:     Conjunctiva/sclera: Conjunctivae normal.  Cardiovascular:     Rate and Rhythm: Normal rate and regular rhythm.     Pulses:          Posterior tibial pulses are 2+ on the right side and 2+ on the left side.     Heart sounds: No murmur heard. Pulmonary:     Effort: Pulmonary effort is normal. No respiratory distress.     Breath sounds: Normal breath sounds.  Abdominal:     Palpations: Abdomen is soft. There is no hepatomegaly or mass.     Tenderness: There is no abdominal tenderness.  Lymphadenopathy:     Cervical: No cervical adenopathy.  Skin:    General: Skin is warm.     Findings: No erythema or rash.  Neurological:     General: No focal deficit present.     Mental Status: She is alert and oriented to person, place, and time.     Cranial Nerves: No cranial nerve deficit.     Gait: Gait normal.  Psychiatric:     Comments: Well groomed, good eye contact.   ASSESSMENT AND PLAN:  [name] was seen today for 5 months follow-up.  Orders Placed This Encounter  Procedures   Lipid panel   Hemoglobin A1c   Fructosamine   Lab Results  Component Value Date   HGBA1C 6.5 11/30/2020   Lab Results  Component Value Date   CHOL 181 11/30/2020   HDL 53.10 11/30/2020   LDLCALC 117 (H) 11/30/2020   TRIG 57.0 11/30/2020   CHOLHDL 3 11/30/2020   Controlled type 2 diabetes mellitus without complication, without long-term current use of insulin ([place]) HgA1C has been at goal. Continue non pharmacologic treatment. Regular exercise and healthy diet with avoidance of added sugar food intake is an important part of treatment and encouraged. Annual eye  exam, periodic dental and foot care recommended. F/U in 5-6 months  Hyperlipidemia associated with type 2 diabetes mellitus (HCC) Continue Pravastatin 20 mg daily. We discussed CV benefits of statin.  Seasonal allergic rhinitis, unspecified trigger Flonase nasal spray daily as needed and nasal saline irrigations as needed. Zyrtec 10 mg daily also recommended.  -     fluticasone (FLONASE) 50 MCG/ACT nasal spray; Place 1 spray  into both nostrils 2 (two) times daily.   Return in about 6 months (around 06/02/2021).   Daivd Fredericksen G. Martinique, MD  [facility]. [place] office.

## 2020-11-30 NOTE — Patient Instructions (Addendum)
A few things to remember from today's visit:  Controlled type 2 diabetes mellitus without complication, without long-term current use of insulin ([place]) - Plan: Hemoglobin A1c, Fructosamine  Hyperlipidemia associated with type 2 diabetes mellitus ([place]) - Plan: Lipid panel  If you need refills please call your pharmacy. Do not use My Chart to request refills or for acute issues that need immediate attention.   Flonase intranasal at bedtime daily for 2 weeks then as needed. Zyrtec 10 mg daily. Nasal saline irrigations as needed.  Please be sure medication list is accurate. If a new problem present, please set up appointment sooner than planned today. There are 2 forms of allergic rhinitis: Seasonal (hay fever): Caused by an allergy to pollen and/or mold spores in the air. Pollen is the fine powder that comes from the stamen of flowering plants. It can be carried through the air and is easily inhaled. Symptoms are seasonal and usually occur in spring, late summer, and fall. Perennial: Caused by other allergens such as dust mites, pet hair or dander, or mold. Symptoms occur year-round.  Symptoms: Your symptoms can vary, depending on the severity of your allergies. Symptoms can include: Sneezing, coughing.itching (mostly eyes, nose, mouth, throat and skin),runny nose,stuffy nose.headache,pressure in the nose and cheeks,ear fullness and popping, sore throat.watery, red, or swollen eyes,dark circles under your eyes,trouble smelling, and sometimes hives.  Allergic rhinitis cannot be prevented. You can help your symptoms by avoiding the things that you are allergic, including: Keeping windows closed. This is especially important during high-pollen seasons. Washing your hands after petting animals. Using dust- and mite-proof bedding and mattress covers. Wearing glasses outside to protect your eyes. Showering before bed to wash off allergens from hair and skin. You can also avoid things that can make  your symptoms worse, such as: aerosol sprays air pollution cold temperatures humidity irritating fumes tobacco smoke wind wood smoke.   Antihistamines help reduce the sneezing, runny nose, and itchiness of allergies. These come in pill form and as nasal sprays. Allegra,Zyrtec,or Claritin are some examples. Decongestants, such as pseudoephedrine and phenylephrine, help temporarily relieve the stuffy nose of allergies. Decongestants are found in many medicines and come as pills, nose sprays, and nose drops. They could increase heart rate and cause tachycardia and tremor. Nasal Afrin should not be used for more than 3 days because you can become dependent on them. This causes you to feel even more stopped-up when you try to quit using them.  Nasal sprays: steroids or antihistaminics. Over the counter intranasal sterids: Nasocort,Rhinocort,or Flonase.You won't notice their benefits for up to 2 weeks after starting them. Allergy shots or sublingual tablets when other treatment do not help.This is done by immunologists.

## 2020-12-04 ENCOUNTER — Encounter: Payer: Self-pay | Admitting: Family Medicine

## 2020-12-04 LAB — FRUCTOSAMINE: Fructosamine: 231 umol/L (ref 205–285)

## 2020-12-04 MED ORDER — PRAVASTATIN SODIUM 20 MG PO TABS: 20.0000 mg | ORAL_TABLET | Freq: Every day | ORAL | 6 refills | Status: DC

## 2020-12-05 ENCOUNTER — Other Ambulatory Visit: Payer: Self-pay | Admitting: Family Medicine

## 2020-12-05 DIAGNOSIS — A6000 Herpesviral infection of urogenital system, unspecified: Secondary | ICD-10-CM

## 2020-12-05 MED ORDER — VALACYCLOVIR HCL 1 G PO TABS: ORAL_TABLET | ORAL | 0 refills | Status: DC

## 2021-01-13 ENCOUNTER — Ambulatory Visit: Payer: Self-pay | Admitting: Family Medicine

## 2021-01-20 ENCOUNTER — Other Ambulatory Visit: Payer: Self-pay | Admitting: Family Medicine

## 2021-01-20 DIAGNOSIS — A6000 Herpesviral infection of urogenital system, unspecified: Secondary | ICD-10-CM

## 2021-01-20 MED ORDER — VALACYCLOVIR HCL 1 G PO TABS: ORAL_TABLET | ORAL | 0 refills | Status: DC

## 2021-06-02 ENCOUNTER — Ambulatory Visit: Payer: Self-pay | Admitting: Family Medicine

## 2021-06-20 ENCOUNTER — Other Ambulatory Visit: Payer: Self-pay | Admitting: Family Medicine

## 2021-06-20 DIAGNOSIS — A6000 Herpesviral infection of urogenital system, unspecified: Secondary | ICD-10-CM

## 2021-06-21 MED ORDER — VALACYCLOVIR HCL 1 G PO TABS: ORAL_TABLET | ORAL | 0 refills | Status: DC

## 2021-08-08 ENCOUNTER — Other Ambulatory Visit (HOSPITAL_COMMUNITY): Payer: Self-pay | Admitting: Obstetrics and Gynecology

## 2021-08-08 DIAGNOSIS — Z1231 Encounter for screening mammogram for malignant neoplasm of breast: Secondary | ICD-10-CM

## 2021-08-11 ENCOUNTER — Other Ambulatory Visit: Payer: Self-pay | Admitting: Family Medicine

## 2021-08-11 DIAGNOSIS — A6000 Herpesviral infection of urogenital system, unspecified: Secondary | ICD-10-CM

## 2021-08-16 NOTE — Progress Notes (Deleted)
? ? ?  HPI: ? ?[name] is a 51 y.o. female, who is here today to follow on recent visit. ? ?Review of Systems ?Rest see pertinent positives and negatives per HPI. ? ?Current Outpatient Medications on File Prior to Visit  ?Medication Sig Dispense Refill  ? acetaminophen (TYLENOL) 500 MG tablet Take 1,000 mg by mouth every 6 (six) hours as needed for mild pain or fever.    ? albuterol (VENTOLIN HFA) 108 (90 Base) MCG/ACT inhaler Inhale 2 puffs into the lungs every 4 (four) hours as needed for wheezing or shortness of breath. 18 g 0  ? fluticasone (FLONASE) 50 MCG/ACT nasal spray Place 1 spray into both nostrils 2 (two) times daily. 16 g 3  ? pravastatin (PRAVACHOL) 20 MG tablet Take 1 tablet (20 mg total) by mouth daily. 30 tablet 6  ? SUMAtriptan (IMITREX) 50 MG tablet Take 1 tablet (50 mg total) by mouth daily as needed for migraine. May repeat in 2 hours if headache persists or recurs. 10 tablet 0  ? valACYclovir (VALTREX) 1000 MG tablet TAKE 1 TABLET BY MOUTH DAILY FOR 5 DAYS.REPEAT TREATMENT FOR FUTURE LESIONS WITHIN 48 HOURS AND AS NEEDED 20 tablet 0  ? ?No current facility-administered medications on file prior to visit.  ? ? ?Past Medical History:  ?Diagnosis Date  ? Atypical chest pain   ? secondary to GERD  ? Diabetes mellitus without complication ([place])   ? GERD (gastroesophageal reflux disease)   ? Head ache   ? Right sided   ? Obesity   ? ?No Known Allergies ? ?Social History  ? ?Socioeconomic History  ? Marital status: Married  ?  Spouse name: Not on file  ? Number of children: 1  ? Years of education: Not on file  ? Highest education level: Not on file  ?Occupational History  ? Occupation: CNA  ?Tobacco Use  ? Smoking status: Never  ? Smokeless tobacco: Never  ?Vaping Use  ? Vaping Use: Never used  ?Substance and Sexual Activity  ? Alcohol use: No  ?  Alcohol/week: 0.0 standard drinks  ? Drug use: No  ? Sexual activity: Not on file  ?Other Topics Concern  ? Not on file  ?Social History  Narrative  ? She is a Quarry manager at [employer]   ? Married for 16 years   ? 50 year old boy   ? She likes to cook and entertain.   ? ?Social Determinants of Health  ? ?Financial Resource Strain: Not on file  ?Food Insecurity: Not on file  ?Transportation Needs: Not on file  ?Physical Activity: Not on file  ?Stress: Not on file  ?Social Connections: Not on file  ? ? ?There were no vitals filed for this visit. ?There is no height or weight on file to calculate BMI. ? ?Physical Exam ? ?ASSESSMENT AND PLAN: ? ? ?There are no diagnoses linked to this encounter. ? ?No orders of the defined types were placed in this encounter. ? ? ?No problem-specific Assessment & Plan notes found for this encounter. ? ? ?No follow-ups on file. ? ? ?Betty G. Martinique, MD ? ?[facility]. ?[place] office. ? ? ? ? ? ? ? ? ? ? ? ? ? ? ? ?

## 2021-08-17 ENCOUNTER — Other Ambulatory Visit: Payer: Self-pay | Admitting: Family Medicine

## 2021-08-17 DIAGNOSIS — A6000 Herpesviral infection of urogenital system, unspecified: Secondary | ICD-10-CM

## 2021-08-18 ENCOUNTER — Ambulatory Visit: Payer: Self-pay | Admitting: Family Medicine

## 2021-08-18 MED ORDER — VALACYCLOVIR HCL 1 G PO TABS: ORAL_TABLET | ORAL | 0 refills | Status: DC

## 2021-08-22 NOTE — Progress Notes (Signed)
? ?ACUTE VISIT ?Chief Complaint  ?Patient presents with  ? Back Pain  ?  X a month, left side. No numbness or tingling, no radiating. Feels like a sharp pain, may have pulled a muscle.  ? Follow-up  ? ?HPI: ?[name] is a 51 y.o. female, who is here today complaining of a couple months of lower back pain as described above. ? ?Back Pain ?The current episode started 1 to 4 weeks ago. The problem occurs intermittently. The problem is unchanged. The pain is present in the lumbar spine. Quality: sharp. The pain does not radiate. The pain is at a severity of 8/10. The pain is moderate. The symptoms are aggravated by standing, twisting and bending. Stiffness is present All day. Pertinent negatives include no abdominal pain, bladder incontinence, bowel incontinence, chest pain, dysuria, fever, headaches, leg pain, numbness, paresis, paresthesias, pelvic pain, perianal numbness, tingling, weakness or weight loss. Risk factors include obesity. She has tried nothing for the symptoms.  ?Exacerbated  y movement, even in bed. ? ?No hx of trauma or unusual physical activity. ? ?DM II: Dx'ed in 2019. ?She is not on pharmacologic treatment. ?BS's 89-129. ?She decreased portions. ?She is walking 3 times per week. ?Negative for polydipsia,polyuria, or polyphagia. ? ?Lab Results  ?Component Value Date  ? HGBA1C 6.5 11/30/2020  ?Appt for eye exam next Friday. ? ?For a few months she has ahd occasionally burning tongue sensation with food intake.Started after COVID 19 infection, for which she was hospitalized in 01/2021. Negative for sore throat, rhinorrhea,or anosmia. ? ?States that she was told she did not have COVID 19 infection but pneumonia. ?According to records, she had COVID 19 related pneumonia and complicated  by PE. ? ?Hearing loss, stable for a while. ?No hx of noise exposure. ?She has noted that she has turned up TV volume. ?Negative for earache, drainage,or recent travel. ? ?HLD: She is on Pravastatin 20 mg  daily. ?She has tolerated medications well. ?She was not fasting last OV, she would like FLP to be checked today. ? ?Lab Results  ?Component Value Date  ?  CHOL 179 07/15/2020  ?  HDL 61.00 07/15/2020  ?  LDLCALC 105 (H) 07/15/2020  ?  TRIG 67.0 07/15/2020  ?  CHOLHDL 3 07/15/2020  ? ?Review of Systems  ?Constitutional:  Negative for fever and weight loss.  ?HENT:  Negative for mouth sores and sore throat.   ?Eyes:  Negative for redness and visual disturbance.  ?Respiratory:  Negative for cough, shortness of breath and wheezing.   ?Cardiovascular:  Negative for chest pain.  ?Gastrointestinal:  Negative for abdominal pain, bowel incontinence, nausea and vomiting.  ?Genitourinary:  Negative for bladder incontinence, decreased urine volume, dysuria, hematuria and pelvic pain.  ?Musculoskeletal:  Positive for back pain. Negative for gait problem.  ?Skin:  Negative for rash.  ?Allergic/Immunologic: Positive for environmental allergies.  ?Neurological:  Negative for dizziness, tingling, syncope, facial asymmetry, weakness, numbness, headaches and paresthesias.  ?Rest see pertinent positives and negatives per HPI. ? ?Current Outpatient Medications on File Prior to Visit  ?Medication Sig Dispense Refill  ? acetaminophen (TYLENOL) 500 MG tablet Take 1,000 mg by mouth every 6 (six) hours as needed for mild pain or fever.    ? fluticasone (FLONASE) 50 MCG/ACT nasal spray Place 1 spray into both nostrils 2 (two) times daily. 16 g 3  ? pravastatin (PRAVACHOL) 20 MG tablet Take 1 tablet (20 mg total) by mouth daily. 30 tablet 6  ? SUMAtriptan (IMITREX) 50  MG tablet Take 1 tablet (50 mg total) by mouth daily as needed for migraine. May repeat in 2 hours if headache persists or recurs. 10 tablet 0  ? valACYclovir (VALTREX) 1000 MG tablet TAKE 1 TABLET BY MOUTH DAILY FOR 5 DAYS.REPEAT TREATMENT FOR FUTURE LESIONS WITHIN 48 HOURS AND AS NEEDED 20 tablet 0  ? ?No current facility-administered medications on file prior to visit.   ? ?Past Medical History:  ?Diagnosis Date  ? Atypical chest pain   ? secondary to GERD  ? Diabetes mellitus without complication ([place])   ? GERD (gastroesophageal reflux disease)   ? Head ache   ? Right sided   ? Obesity   ? ?No Known Allergies ? ?Social History  ? ?Socioeconomic History  ? Marital status: Married  ?  Spouse name: Not on file  ? Number of children: 1  ? Years of education: Not on file  ? Highest education level: Not on file  ?Occupational History  ? Occupation: CNA  ?Tobacco Use  ? Smoking status: Never  ? Smokeless tobacco: Never  ?Vaping Use  ? Vaping Use: Never used  ?Substance and Sexual Activity  ? Alcohol use: No  ?  Alcohol/week: 0.0 standard drinks  ? Drug use: No  ? Sexual activity: Not on file  ?Other Topics Concern  ? Not on file  ?Social History Narrative  ? She is a Quarry manager at [employer]   ? Married for 16 years   ? 50 year old boy   ? She likes to cook and entertain.   ? ?Social Determinants of Health  ? ?Financial Resource Strain: Unknown  ? Difficulty of Paying Living Expenses: Patient refused  ?Food Insecurity: Unknown  ? Worried About Charity fundraiser in the Last Year: Patient refused  ? Ran Out of Food in the Last Year: Patient refused  ?Transportation Needs: Unknown  ? Lack of Transportation (Medical): Patient refused  ? Lack of Transportation (Non-Medical): Patient refused  ?Physical Activity: Insufficiently Active  ? Days of Exercise per Week: 3 days  ? Minutes of Exercise per Session: 40 min  ?Stress: No Stress Concern Present  ? Feeling of Stress : Not at all  ?Social Connections: Unknown  ? Frequency of Communication with Friends and Family: Patient refused  ? Frequency of Social Gatherings with Friends and Family: Patient refused  ? Attends Religious Services: Patient refused  ? Active Member of Clubs or Organizations: Patient refused  ? Attends Archivist Meetings: Not on file  ? Marital Status: Patient refused  ? ?Vitals:  ? 08/23/21 1344  ?BP: 128/80  ?Pulse: 76   ?Resp: 16  ?Temp: 98.1 ?F (36.7 ?C)  ?SpO2: 97%  ? ?Wt Readings from Last 3 Encounters:  ?08/23/21 186 lb 6 oz (84.5 kg)  ?11/30/20 184 lb 2 oz (83.5 kg)  ?07/15/20 188 lb 6 oz (85.4 kg)  ? ?Body mass index is 35.22 kg/m?. ? ?Physical Exam ?Vitals and nursing note reviewed.  ?Constitutional:   ?   General: She is not in acute distress. ?   Appearance: She is well-developed.  ?HENT:  ?   Head: Normocephalic and atraumatic.  ?   Right Ear: Tympanic membrane, ear canal and external ear normal. Decreased hearing noted.  ?   Left Ear: Tympanic membrane, ear canal and external ear normal. Decreased hearing noted.  ?   Mouth/Throat:  ?   Lips: No lesions.  ?   Mouth: Mucous membranes are moist.  ?  Tongue: No lesions. Tongue does not deviate from midline.  ?   Pharynx: Oropharynx is clear.  ?Eyes:  ?   Conjunctiva/sclera: Conjunctivae normal.  ?Cardiovascular:  ?   Rate and Rhythm: Normal rate and regular rhythm.  ?   Pulses:     ?     Dorsalis pedis pulses are 2+ on the right side and 2+ on the left side.  ?   Heart sounds: No murmur heard. ?Pulmonary:  ?   Effort: Pulmonary effort is normal. No respiratory distress.  ?   Breath sounds: Normal breath sounds.  ?Abdominal:  ?   Palpations: Abdomen is soft. There is no hepatomegaly or mass.  ?   Tenderness: There is no abdominal tenderness.  ?Musculoskeletal:  ?   Thoracic back: No tenderness.  ?   Lumbar back: No tenderness or bony tenderness. Negative right straight leg raise test and negative left straight leg raise test.  ?   Comments: Left-sided lumbar pain with movement on examination table. No significant deformities. ?No local edema or erythema.  ?Lymphadenopathy:  ?   Cervical: No cervical adenopathy.  ?Skin: ?   General: Skin is warm.  ?   Findings: No erythema or rash.  ?Neurological:  ?   General: No focal deficit present.  ?   Mental Status: She is alert and oriented to person, place, and time.  ?   Cranial Nerves: No cranial nerve deficit.  ?   Gait: Gait  normal.  ?   Deep Tendon Reflexes:  ?   Reflex Scores: ?     Patellar reflexes are 2+ on the right side and 2+ on the left side. ?Psychiatric:  ?   Comments: Well groomed, good eye contact.  ? ?Diabetic Foot Exa

## 2021-08-23 ENCOUNTER — Ambulatory Visit (INDEPENDENT_AMBULATORY_CARE_PROVIDER_SITE_OTHER): Payer: Self-pay

## 2021-08-23 ENCOUNTER — Ambulatory Visit (INDEPENDENT_AMBULATORY_CARE_PROVIDER_SITE_OTHER): Payer: Self-pay | Admitting: Family Medicine

## 2021-08-23 ENCOUNTER — Encounter: Payer: Self-pay | Admitting: Family Medicine

## 2021-08-23 VITALS — BP 128/80 | HR 76 | Temp 98.1°F | Resp 16 | Ht 61.0 in | Wt 186.4 lb

## 2021-08-23 DIAGNOSIS — M545 Low back pain, unspecified: Secondary | ICD-10-CM

## 2021-08-23 DIAGNOSIS — E785 Hyperlipidemia, unspecified: Secondary | ICD-10-CM

## 2021-08-23 DIAGNOSIS — K146 Glossodynia: Secondary | ICD-10-CM

## 2021-08-23 DIAGNOSIS — Z6835 Body mass index (BMI) 35.0-35.9, adult: Secondary | ICD-10-CM

## 2021-08-23 DIAGNOSIS — E1169 Type 2 diabetes mellitus with other specified complication: Secondary | ICD-10-CM

## 2021-08-23 DIAGNOSIS — H9190 Unspecified hearing loss, unspecified ear: Secondary | ICD-10-CM

## 2021-08-23 DIAGNOSIS — E119 Type 2 diabetes mellitus without complications: Secondary | ICD-10-CM

## 2021-08-23 LAB — MICROALBUMIN / CREATININE URINE RATIO
Creatinine,U: 134.8 mg/dL
Microalb Creat Ratio: 0.5 mg/g (ref 0.0–30.0)
Microalb, Ur: <0.7 mg/dL (ref 0.0–1.9)

## 2021-08-23 LAB — LIPID PANEL
Cholesterol: 168 mg/dL (ref 0–200)
HDL: 61.3 mg/dL (ref >39.00)
LDL Cholesterol: 100 mg/dL — ABNORMAL HIGH (ref 0–99)
NonHDL: 106.88
Total CHOL/HDL Ratio: 3
Triglycerides: 33 mg/dL (ref 0.0–149.0)
VLDL: 6.6 mg/dL (ref 0.0–40.0)

## 2021-08-23 LAB — TSH: TSH: 1.42 u[IU]/mL (ref 0.35–5.50)

## 2021-08-23 LAB — COMPREHENSIVE METABOLIC PANEL
ALT: 14 U/L (ref 0–35)
AST: 18 U/L (ref 0–37)
Albumin: 4.2 g/dL (ref 3.5–5.2)
Alkaline Phosphatase: 64 U/L (ref 39–117)
BUN: 14 mg/dL (ref 6–23)
CO2: 28 mEq/L (ref 19–32)
Calcium: 9.8 mg/dL (ref 8.4–10.5)
Chloride: 105 mEq/L (ref 96–112)
Creatinine, Ser: 0.6 mg/dL (ref 0.40–1.20)
GFR: 105.19 mL/min (ref >60.00)
Glucose, Bld: 92 mg/dL (ref 70–99)
Potassium: 4 mEq/L (ref 3.5–5.1)
Sodium: 140 mEq/L (ref 135–145)
Total Bilirubin: 0.4 mg/dL (ref 0.2–1.2)
Total Protein: 7 g/dL (ref 6.0–8.3)

## 2021-08-23 LAB — VITAMIN B12: Vitamin B-12: 409 pg/mL (ref 211–911)

## 2021-08-23 LAB — HEMOGLOBIN A1C: Hgb A1c MFr Bld: 6.5 % (ref 4.6–6.5)

## 2021-08-23 MED ORDER — METHOCARBAMOL 500 MG PO TABS
500.0000 mg | ORAL_TABLET | Freq: Three times a day (TID) | ORAL | 0 refills | Status: DC | PRN
Start: 2021-08-23 — End: 2021-10-17

## 2021-08-23 NOTE — Assessment & Plan Note (Signed)
Wt otherwise stable. ?We discussed benefits of wt loss as well as adverse effects of obesity. ?Consistency with healthy diet and physical activity encouraged, small changes at the time. ? ?

## 2021-08-23 NOTE — Assessment & Plan Note (Addendum)
Continue Pravastatin 20 mg daily and low fat diet. ?We discussed CV benefits or statins. ?Further recommendations according to lipid panel result. ?

## 2021-08-23 NOTE — Patient Instructions (Addendum)
A few things to remember from today's visit: ? ?Burning tongue - Plan: Vitamin B12, TSH ? ?Hearing loss, unspecified hearing loss type, unspecified laterality ? ?Hyperlipidemia associated with type 2 diabetes mellitus ([place]) - Plan: Comprehensive metabolic panel, Lipid panel, TSH ? ?Controlled type 2 diabetes mellitus without complication, without long-term current use of insulin ([place]) - Plan: Hemoglobin A1c, Microalbumin / creatinine urine ratio ? ?Left-sided low back pain without sciatica, unspecified chronicity - Plan: DG Lumbar Spine Complete, Ambulatory referral to Physical Therapy, methocarbamol (ROBAXIN) 500 MG tablet ? ?If you need refills please call your pharmacy. ?Do not use My Chart to request refills or for acute issues that need immediate attention. ?  ?Methocarbamol in a muscle relaxant that causes drowsiness. ? ?Please be sure medication list is accurate. ?If a new problem present, please set up appointment sooner than planned today. ?Hearing Loss ?Hearing loss is a partial or total loss of the ability to hear. This can be temporary or permanent, and it can happen in one or both ears. ?Medical care is necessary to treat hearing loss properly and to prevent the condition from getting worse. Your hearing may partially or completely come back, depending on what caused your hearing loss and how severe it is. In some cases, hearing loss is permanent. ?What are the causes? ?Common causes of hearing loss include: ?Too much wax in the ear canal. ?Infection of the ear canal or middle ear. ?Fluid in the middle ear. ?Injury to the ear or surrounding area. ?An object stuck in the ear. ?A history of prolonged exposure to loud sounds, such as music. ?Less common causes of hearing loss include: ?Tumors in the ear. ?Viral or bacterial infections, such as meningitis. ?A hole in the eardrum (perforated eardrum). ?Problems with the hearing nerve that sends signals between the brain and the ear. ?Certain medicines. ?What  are the signs or symptoms? ?Symptoms of this condition may include: ?Difficulty telling the difference between sounds. ?Difficulty following a conversation when there is background noise. ?Lack of response to sounds in your environment. This may be most noticeable when you do not respond to startling sounds. ?Needing to turn up the volume on the television, radio, or other devices. ?Ringing in the ears. ?Dizziness. ?How is this diagnosed? ?This condition is diagnosed based on: ?A physical exam. ?A hearing test (audiometry). The audiometry test will be performed by a hearing specialist (audiologist). ?You may also be referred to an ear, nose, and throat (ENT) specialist (otolaryngologist). ?How is this treated? ?Treatment for hearing loss may include: ?Ear wax removal. ?Medicines to treat or prevent infection (antibiotics). ?Medicines to reduce inflammation (corticosteroids). ?Hearing aids for hearing loss related to nerve damage. ?Follow these instructions at home: ?If you were prescribed an antibiotic medicine, take it as told by your health care provider. Do not stop taking the antibiotic even if you start to feel better. ?Take over-the-counter and prescription medicines only as told by your health care provider. ?Avoid loud noises. ?Return to your normal activities as told by your health care provider. Ask your health care provider what activities are safe for you. ?Keep all follow-up visits as told by your health care provider. This is important. ?Contact a health care provider if: ?You feel dizzy. ?You develop new symptoms. ?You vomit or feel nauseous. ?You have a fever. ?Get help right away if: ?You develop sudden changes in your vision. ?You have severe ear pain. ?You have new or increased weakness. ?You have a severe headache. ?Summary ?Hearing loss is  a decreased ability to hear sounds around you. It can be temporary or permanent. ?Treatment will depend on the cause of your hearing loss. It may include ear  wax removal, medicines, or a hearing aid. ?Your hearing may partially or completely come back, depending on what caused your hearing loss and how severe it is. ?Keep all follow-up visits as told by your health care provider. This is important. ?This information is not intended to replace advice given to you by your health care provider. Make sure you discuss any questions you have with your health care provider. ?Document Revised: 02/04/2018 Document Reviewed: 02/04/2018 ?Elsevier Patient Education ? 2022 [place]. ? ? ? ? ? ? ? ?

## 2021-08-23 NOTE — Assessment & Plan Note (Signed)
Problem has been going on for a while. ?Abnormal hearing screening today. ?No other associated symptoms, so will refer to audiologist. ? ?

## 2021-09-20 ENCOUNTER — Other Ambulatory Visit: Payer: Self-pay

## 2021-09-20 DIAGNOSIS — Z1211 Encounter for screening for malignant neoplasm of colon: Secondary | ICD-10-CM

## 2021-09-22 ENCOUNTER — Encounter (HOSPITAL_COMMUNITY): Payer: Self-pay

## 2021-09-22 ENCOUNTER — Inpatient Hospital Stay (HOSPITAL_COMMUNITY): Payer: Self-pay | Attending: Obstetrics and Gynecology | Admitting: *Deleted

## 2021-09-22 ENCOUNTER — Ambulatory Visit (HOSPITAL_COMMUNITY)
Admission: RE | Admit: 2021-09-22 | Discharge: 2021-09-22 | Disposition: A | Discharge status: Home / Self Care | Payer: Self-pay | Source: Ambulatory Visit | Attending: Obstetrics and Gynecology | Admitting: Obstetrics and Gynecology

## 2021-09-22 ENCOUNTER — Other Ambulatory Visit: Payer: Self-pay

## 2021-09-22 VITALS — BP 109/61 | Wt 188.2 lb

## 2021-09-22 DIAGNOSIS — Z1239 Encounter for other screening for malignant neoplasm of breast: Secondary | ICD-10-CM

## 2021-09-22 DIAGNOSIS — Z1231 Encounter for screening mammogram for malignant neoplasm of breast: Secondary | ICD-10-CM | POA: Insufficient documentation

## 2021-09-22 DIAGNOSIS — Z1211 Encounter for screening for malignant neoplasm of colon: Secondary | ICD-10-CM

## 2021-09-22 NOTE — Progress Notes (Addendum)
Ms. SHERLON NIED is a 51 y.o. female who presents to [facility] clinic today with no complaints.  ?  ?Pap Smear: Pap smear not completed today. Last Pap smear was 08/23/2017 at [facility] clinic and was normal with negative HPV. Per patient has no history of an abnormal Pap smear. Patient has a history of a hysterectomy in 2019 following last Pap smear due to fibroids. Patient doesn't need any further Pap smears due to her history of a hysterectomy for benign reasons per BCCCP and ASCCP guidelines. Last Pap smear result is available in Epic. ?  ?Physical exam: ?Breasts ?Breasts symmetrical. No skin abnormalities bilateral breasts. No nipple retraction bilateral breasts. No nipple discharge bilateral breasts. No lymphadenopathy. No lumps palpated bilateral breasts. No complaints of pain or tenderness on exam. ? ?MM SCREENING BREAST TOMO BILATERAL ? ?Result Date: 10/01/2017 ?CLINICAL DATA:  Screening. EXAM: DIGITAL SCREENING BILATERAL MAMMOGRAM WITH TOMO AND CAD COMPARISON:  Previous exam(s). ACR Breast Density Category b: There are scattered areas of fibroglandular density. FINDINGS: There are no findings suspicious for malignancy. Images were processed with CAD. IMPRESSION: No mammographic evidence of malignancy. A result letter of this screening mammogram will be mailed directly to the patient. RECOMMENDATION: Screening mammogram in one year. (Code:SM-B-01Y) BI-RADS CATEGORY  1: Negative. Electronically Signed   By: Nolon Nations M.D.   On: 10/01/2017 09:43   ?     ?Pelvic/Bimanual ?Pap is not indicated today per BCCCP guidelines. ?  ?Smoking History: ?Patient has never smoked. ?  ?Patient Navigation: ?Patient education provided. Access to services provided for patient through [facility] program.  ? ?Colorectal Cancer Screening: ?Per patient has had a colonoscopy completed 8 years ago. FIT Test given to patient today to complete. No complaints today.  ?  ?Breast and Cervical Cancer Risk Assessment: ?Patient has  family history of her mother and a maternal aunt having breast cancer. Patient has no known genetic mutations or history of radiation treatment to the chest before age 79. Patient does not have history of cervical dysplasia, immunocompromised, or DES exposure in-utero. ? ?Risk Assessment   ? ? Risk Scores   ? ?   09/22/2021  ? Last edited by: Loletta Parish, RN  ? 5-year risk: 1.8 %  ? Lifetime risk: 13.7 %  ? ?  ?  ? ?  ? ? ?A: ?BCCCP exam without pap smear ?No complaints. ? ?P: ?Referred patient to [place] for a screening mammogram. Appointment scheduled Friday, Sep 22, 2021 at 1030. ? ?Loletta Parish, RN ?09/22/2021 9:53 AM   ?

## 2021-09-22 NOTE — Patient Instructions (Signed)
Explained breast self awareness with [name]. Patient did not need a Pap smear today due to patient has a history of a hysterectomy for benign reasons. Let patient know that she doesn't need any further Pap smears due to her history of a hysterectomy for benign reasons. Referred patient to [place] for a screening mammogram. Appointment scheduled Friday, Sep 22, 2021 at 1030. Patient aware of appointment and will be there. Let patient know [facility] will follow up with her within the next couple weeks with results of her mammogram by letter or phone. Beverly Townsend verbalized understanding. ? ?Tanmay Halteman, Arvil Chaco, RN ?9:53 AM ? ? ? ? ?

## 2021-09-26 ENCOUNTER — Ambulatory Visit (HOSPITAL_COMMUNITY): Payer: Self-pay

## 2021-10-14 ENCOUNTER — Other Ambulatory Visit: Payer: Self-pay | Admitting: Family Medicine

## 2021-10-14 DIAGNOSIS — M545 Low back pain, unspecified: Secondary | ICD-10-CM

## 2021-10-27 ENCOUNTER — Ambulatory Visit: Payer: Self-pay | Admitting: Audiologist

## 2021-11-17 ENCOUNTER — Ambulatory Visit (HOSPITAL_COMMUNITY): Payer: Self-pay

## 2021-12-08 ENCOUNTER — Ambulatory Visit: Payer: Self-pay | Admitting: Audiologist

## 2021-12-12 ENCOUNTER — Other Ambulatory Visit: Payer: Self-pay | Admitting: Family Medicine

## 2021-12-12 DIAGNOSIS — A6000 Herpesviral infection of urogenital system, unspecified: Secondary | ICD-10-CM

## 2021-12-12 MED ORDER — VALACYCLOVIR HCL 1 G PO TABS: ORAL_TABLET | ORAL | 0 refills | Status: DC

## 2021-12-16 ENCOUNTER — Other Ambulatory Visit: Payer: Self-pay | Admitting: Family Medicine

## 2021-12-29 ENCOUNTER — Ambulatory Visit: Payer: Self-pay | Admitting: Audiologist

## 2022-01-11 ENCOUNTER — Ambulatory Visit: Payer: Self-pay | Admitting: Audiologist

## 2022-01-18 ENCOUNTER — Ambulatory Visit: Payer: Self-pay | Attending: Audiologist | Admitting: Audiologist

## 2022-01-18 DIAGNOSIS — H903 Sensorineural hearing loss, bilateral: Secondary | ICD-10-CM | POA: Insufficient documentation

## 2022-01-18 NOTE — Procedures (Signed)
  Outpatient Audiology and [place], [place]  [id number] [phone]  AUDIOLOGICAL  EVALUATION  NAME: Beverly Townsend     DOB:   Sep 21, 1970      MRN: [id number]                                                                                     DATE: 01/18/2022     REFERENT: Martinique, Betty G, MD STATUS: Outpatient DIAGNOSIS: Sensorineural Hearing Loss Bilateral     History: [name] was seen for an audiological evaluation.  [name] is receiving a hearing evaluation due to concerns for having to turn up the TV louder. [name] has difficulty hearing people on the phone and on TV. This difficulty began gradually. No pain or pressure reported in either ear. Tinnitus denied for both ears. July has no significant history of noise exposure. She has no family history of hearing loss. Per doctor screened her hearing at her last well visit, and recommended [name] receive a full audiologic evaluation.  Medical history positive for pre-diabetes which is a risk factor for hearing loss. No other relevant case history reported.   Evaluation:  Otoscopy showed a clear view of the tympanic membranes, bilaterally Tympanometry results were consistent with normal middle ear pressure, bilaterally   Audiometric testing was completed using conventional audiometry with insert transducer. Speech Recognition Thresholds were  30dB in the right ear and 35dB in the left ear. Word Recognition was performed 40 dB SL, scored 100% in the right ear and 100% in the left ear. Pure tone thresholds show normal sloping after 1kHz to a moderate sensorineural hearing loss in each ear, rising back to mild loss by [facility] bilaterally    Results:  The test results were reviewed with Kanisha. [name] has a mild to moderate high pitched sensorineural hearing loss in each ear. Hearing aids are recommended and [name] is motivated to try them. She was given two copies of her audiogram and a list of  local hearing aid providers. [name] reported understanding and will call for a hearing aid consultation today.   Recommendations:   Amplification is necessary for both ears. Hearing aids can be purchased from a variety of locations. See provided list for locations in the [place] area.    39 minutes spent testing and counseling on results.   Alfonse Alpers  Audiologist, Au.D., CCC-A 01/18/2022  10:39 AM  Cc: Martinique, Betty G, MD

## 2022-02-13 ENCOUNTER — Other Ambulatory Visit: Payer: Self-pay

## 2022-02-13 ENCOUNTER — Emergency Department (HOSPITAL_COMMUNITY)
Admission: EM | Admit: 2022-02-13 | Discharge: 2022-02-13 | Disposition: A | Discharge status: Home / Self Care | Payer: Self-pay | Attending: Emergency Medicine | Admitting: Emergency Medicine

## 2022-02-13 ENCOUNTER — Emergency Department (HOSPITAL_COMMUNITY): Payer: Self-pay

## 2022-02-13 ENCOUNTER — Encounter (HOSPITAL_COMMUNITY): Payer: Self-pay | Admitting: *Deleted

## 2022-02-13 DIAGNOSIS — S93402A Sprain of unspecified ligament of left ankle, initial encounter: Secondary | ICD-10-CM | POA: Insufficient documentation

## 2022-02-13 DIAGNOSIS — X501XXA Overexertion from prolonged static or awkward postures, initial encounter: Secondary | ICD-10-CM | POA: Insufficient documentation

## 2022-02-13 MED ORDER — IBUPROFEN 600 MG PO TABS: 600.0000 mg | ORAL_TABLET | Freq: Three times a day (TID) | ORAL | 0 refills | Status: DC | PRN

## 2022-02-13 NOTE — ED Provider Notes (Signed)
[facility] EMERGENCY DEPARTMENT Provider Note   CSN: [id number] Arrival date & time: 02/13/22  1156     History  Chief Complaint  Patient presents with   Beverly Townsend is a 51 y.o. female who presents with left ankle pain which occurred suddenly when the patient inverted the foot when stepping down out of a trailer 4 days ago.  Pain is aching, constant and worse with palpation, movement and weight bearing.  The patient was able to weight bear immediately after the event.  There is radiation of pain into her lateral knee area.  the the patient denies numbness distal to the injury site.  The patients treatment prior to arrival included ice rest, and use a knee scooter.   The history is provided by the patient.       Home Medications Prior to Admission medications   Medication Sig Start Date End Date Taking? Authorizing Provider  ibuprofen (ADVIL) 600 MG tablet Take 1 tablet (600 mg total) by mouth every 8 (eight) hours as needed. 02/13/22  Yes Chrisopher Pustejovsky, Almyra Free, PA-C  acetaminophen (TYLENOL) 500 MG tablet Take 1,000 mg by mouth every 6 (six) hours as needed for mild pain or fever.    [provider]  fluticasone (FLONASE) 50 MCG/ACT nasal spray Place 1 spray into both nostrils 2 (two) times daily. 11/30/20   Martinique, Betty G, MD  methocarbamol (ROBAXIN) 500 MG tablet Take 1 tablet (500 mg total) by mouth daily as needed for muscle spasms. 10/17/21   Martinique, Betty G, MD  pravastatin (PRAVACHOL) 20 MG tablet TAKE 1 TABLET BY MOUTH EVERY DAY 12/18/21   Martinique, Betty G, MD  SUMAtriptan (IMITREX) 50 MG tablet Take 1 tablet (50 mg total) by mouth daily as needed for migraine. May repeat in 2 hours if headache persists or recurs. 07/15/20   Martinique, Betty G, MD  valACYclovir (VALTREX) 1000 MG tablet TAKE 1 TABLET BY MOUTH DAILY FOR 5 DAYS.REPEAT TREATMENT FOR FUTURE LESIONS WITHIN 80 HOURS AND AS NEEDED 12/12/21   Martinique, Betty G, MD      Allergies    Patient has no known  allergies.    Review of Systems   Review of Systems  Musculoskeletal:  Positive for arthralgias and joint swelling.  Skin:  Negative for wound.  Neurological:  Negative for weakness and numbness.  All other systems reviewed and are negative.   Physical Exam Updated Vital Signs BP 116/80 (BP Location: Right Arm)   Pulse 65   Temp 98.7 F (37.1 C) (Oral)   Resp 20   Ht '5\' 1"'$  (1.549 m)   Wt 77.1 kg   SpO2 96%   BMI 32.12 kg/m  Physical Exam Vitals and nursing note reviewed.  Constitutional:      Appearance: She is well-developed.  HENT:     Head: Normocephalic.  Cardiovascular:     Rate and Rhythm: Normal rate.     Pulses: Normal pulses. No decreased pulses.          Dorsalis pedis pulses are 2+ on the right side and 2+ on the left side.       Posterior tibial pulses are 2+ on the right side and 2+ on the left side.  Musculoskeletal:        General: Swelling and tenderness present. No deformity.     Right ankle: Normal pulse.     Right Achilles Tendon: Normal.     Left ankle: Swelling present. No ecchymosis. Tenderness present over  the lateral malleolus and proximal fibula. Decreased range of motion. Anterior drawer test negative. Normal pulse.     Left Achilles Tendon: Normal. Thompson's test negative.  Skin:    General: Skin is warm and dry.     Findings: No lesion.  Neurological:     Mental Status: She is alert.     Sensory: No sensory deficit.     ED Results / Procedures / Treatments   Labs (all labs ordered are listed, but only abnormal results are displayed) Labs Reviewed - No data to display  EKG None  Radiology DG Knee Complete 4 Views Left  Result Date: 02/13/2022 CLINICAL DATA:  Fall.  Injury EXAM: LEFT KNEE - COMPLETE 4+ VIEW COMPARISON:  None Available. FINDINGS: Normal alignment. No fracture or joint effusion. Mild joint space narrowing and spurring medially. IMPRESSION: Negative for fracture Electronically Signed   By: Franchot Gallo M.D.   On:  02/13/2022 14:06   DG Ankle Complete Left  Result Date: 02/13/2022 CLINICAL DATA:  Trauma, fall, pain EXAM: LEFT ANKLE COMPLETE - 3+ VIEW COMPARISON:  None Available. FINDINGS: There is 3 mm calcific density adjacent to the tip of lateral malleolus. There is soft tissue swelling around the ankle, more so over the lateral malleolus. Small plantar spur is seen in calcaneus. There is calcification at the attachment of Achilles tendon to the calcaneus. IMPRESSION: 3 mm calcific density adjacent to the tip of lateral malleolus suggests recent or old avulsion fracture. Small plantar spur is seen in calcaneus. Electronically Signed   By: Elmer Picker M.D.   On: 02/13/2022 12:33    Procedures Procedures    Medications Ordered in ED Medications - No data to display  ED Course/ Medical Decision Making/ A&P                           Medical Decision Making RICE,aso, crutches. Prn f/u if not improving over the next week, discussed injury may take weeks to heal, should wear aso until pain free.  Advised recheck if not able to stop using crutches after one week without increased pain or swelling.   Amount and/or Complexity of Data Reviewed Radiology: ordered and independent interpretation performed.    Details: Reviewed, agree with interpretation           Final Clinical Impression(s) / ED Diagnoses Final diagnoses:  Sprain of left ankle, unspecified ligament, initial encounter    Rx / DC Orders ED Discharge Orders          Ordered    ibuprofen (ADVIL) 600 MG tablet  Every 8 hours PRN        02/13/22 1441              Evalee Jefferson, PA-C 02/13/22 1507    Fredia Sorrow, MD 02/16/22 [phone]

## 2022-02-13 NOTE — Discharge Instructions (Signed)
Wear the ASO ankle brace to protect your ankle when weight bearing.   Use ice and elevation as much as possible for the next several days to help reduce the swelling.  Take the ibuprofen for pain and for inflammation.  Call the orthopedic doctor listed for a recheck of your injury in 1 week.  You may benefit from physical therapy of your ankle if it is not getting better over the next week.

## 2022-02-13 NOTE — ED Triage Notes (Signed)
Pt stepped out of a trailer and twisted her left ankle after her foot slid off a curb. Occurred last week.  Pt has iced and elevated it.  Pt with left ankle pain that radiates up to her knee.

## 2022-02-21 NOTE — Progress Notes (Deleted)
HPI: [name] is a 51 y.o. female, who is here today for chronic disease management.  Last seen on 08/23/21.  Review of Systems Rest of ROS see pertinent positives and negatives in HPI.  Current Outpatient Medications on File Prior to Visit  Medication Sig Dispense Refill   acetaminophen (TYLENOL) 500 MG tablet Take 1,000 mg by mouth every 6 (six) hours as needed for mild pain or fever.     fluticasone (FLONASE) 50 MCG/ACT nasal spray Place 1 spray into both nostrils 2 (two) times daily. 16 g 3   ibuprofen (ADVIL) 600 MG tablet Take 1 tablet (600 mg total) by mouth every 8 (eight) hours as needed. 21 tablet 0   methocarbamol (ROBAXIN) 500 MG tablet Take 1 tablet (500 mg total) by mouth daily as needed for muscle spasms. 30 tablet 1   pravastatin (PRAVACHOL) 20 MG tablet TAKE 1 TABLET BY MOUTH EVERY DAY 30 tablet 6   SUMAtriptan (IMITREX) 50 MG tablet Take 1 tablet (50 mg total) by mouth daily as needed for migraine. May repeat in 2 hours if headache persists or recurs. 10 tablet 0   valACYclovir (VALTREX) 1000 MG tablet TAKE 1 TABLET BY MOUTH DAILY FOR 5 DAYS.REPEAT TREATMENT FOR FUTURE LESIONS WITHIN 48 HOURS AND AS NEEDED 20 tablet 0   No current facility-administered medications on file prior to visit.    Past Medical History:  Diagnosis Date   Atypical chest pain    secondary to GERD   Diabetes mellitus without complication (HCC)    GERD (gastroesophageal reflux disease)    Head ache    Right sided    Obesity    No Known Allergies  Social History   Socioeconomic History   Marital status: Married    Spouse name: Not on file   Number of children: 1   Years of education: Not on file   Highest education level: Bachelor's degree (e.g., BA, AB, BS)  Occupational History   Occupation: CNA  Tobacco Use   Smoking status: Never   Smokeless tobacco: Never  Vaping Use   Vaping Use: Never used  Substance and Sexual Activity   Alcohol use: No    Alcohol/week:  0.0 standard drinks of alcohol   Drug use: No   Sexual activity: Yes    Birth control/protection: Surgical  Other Topics Concern   Not on file  Social History Narrative   She is a CNA at [employer]    Married for 34 years    50 year old boy    She likes to cook and entertain.    Social Determinants of Health   Financial Resource Strain: Unknown (08/23/2021)   Overall Financial Resource Strain (CARDIA)    Difficulty of Paying Living Expenses: Patient refused  Food Insecurity: No Food Insecurity (09/22/2021)   Hunger Vital Sign    Worried About Running Out of Food in the Last Year: Never true    Ran Out of Food in the Last Year: Never true  Transportation Needs: No Transportation Needs (09/22/2021)   PRAPARE - Hydrologist (Medical): No    Lack of Transportation (Non-Medical): No  Physical Activity: Insufficiently Active (08/23/2021)   Exercise Vital Sign    Days of Exercise per Week: 3 days    Minutes of Exercise per Session: 40 min  Stress: No Stress Concern Present (08/23/2021)   [place]    Feeling of Stress :  Not at all  Social Connections: Unknown (08/23/2021)   Social Connection and Isolation Panel [NHANES]    Frequency of Communication with Friends and Family: Patient refused    Frequency of Social Gatherings with Friends and Family: Patient refused    Attends Religious Services: Patient refused    Marine scientist or Organizations: Patient refused    Attends Archivist Meetings: Not on file    Marital Status: Patient refused    There were no vitals filed for this visit. There is no height or weight on file to calculate BMI.  Physical Exam  ASSESSMENT AND PLAN:  There are no diagnoses linked to this encounter.  No orders of the defined types were placed in this encounter.   No problem-specific Assessment & Plan notes found for this encounter.   No  follow-ups on file.  Betty G. Martinique, MD  [facility]. [place] office.

## 2022-02-23 ENCOUNTER — Ambulatory Visit: Payer: Self-pay | Admitting: Family Medicine

## 2022-02-26 ENCOUNTER — Encounter: Payer: Self-pay | Admitting: Orthopedic Surgery

## 2022-02-26 ENCOUNTER — Ambulatory Visit (INDEPENDENT_AMBULATORY_CARE_PROVIDER_SITE_OTHER): Payer: Self-pay | Admitting: Orthopedic Surgery

## 2022-02-26 VITALS — Ht 61.0 in | Wt 183.0 lb

## 2022-02-26 DIAGNOSIS — S93402A Sprain of unspecified ligament of left ankle, initial encounter: Secondary | ICD-10-CM

## 2022-02-26 NOTE — Patient Instructions (Addendum)
Physical therapy has been ordered for you at [facility]. They should call you to schedule, [phone] is the phone number to call, if you want to call to schedule.   

## 2022-02-26 NOTE — Progress Notes (Signed)
New patient evaluation  Chief Complaint  Patient presents with   Ankle Injury    LEFT- Sept 16 DOI was stepping out of trailer and ankle was caught between step and curb ankle went one way and body went other way Icing and elevating but not helping     51 year old female as stated above injured her ankle at [place] [employer] on 16 September.  She stepped down out of a trailer her foot was caught between the trailer and the sidewalk.  She waited about a week and then went to urgent care x-rays were negative she complains of medial lateral ankle pain some pain under the fifth metatarsal head as well as on the dorsum of the foot  She has been in an ankle stabilizer and is able to weight-bear now  Review of systems negative for any numbness or tingling or instability  Ht '5\' 1"'$  (1.549 m)   Wt 183 lb (83 kg)   BMI 34.58 kg/m   She is awake alert and oriented x3 her mood and affect is normal  General appearance is also normal  Neurovascular exam is intact  She is tender on the medial and lateral side of the ankle and swollen laterally the anterior drawer test is negative and the internal rotation test and inversion test are normal  She has good strength in her plantarflexion dorsiflexion testing  Outside imaging was reviewed I do not see a fracture dislocation I see an intact mortise  Encounter Diagnosis  Name Primary?   Severe ankle sprain, left, initial encounter Yes    Recommend continue ASO bracing physical therapy return 4 weeks for recheck should not need any surgery for this condition

## 2022-03-02 ENCOUNTER — Ambulatory Visit (HOSPITAL_COMMUNITY): Payer: Self-pay | Attending: Orthopedic Surgery | Admitting: Physical Therapy

## 2022-03-02 DIAGNOSIS — R262 Difficulty in walking, not elsewhere classified: Secondary | ICD-10-CM

## 2022-03-02 DIAGNOSIS — M25672 Stiffness of left ankle, not elsewhere classified: Secondary | ICD-10-CM

## 2022-03-02 DIAGNOSIS — M25572 Pain in left ankle and joints of left foot: Secondary | ICD-10-CM

## 2022-03-02 DIAGNOSIS — S93402A Sprain of unspecified ligament of left ankle, initial encounter: Secondary | ICD-10-CM | POA: Insufficient documentation

## 2022-03-02 NOTE — Therapy (Signed)
OUTPATIENT PHYSICAL THERAPY LOWER EXTREMITY EVALUATION   Patient Name: Beverly Townsend MRN: [id number] DOB:Aug 01, 1970, 51 y.o., female Today's Date: 03/02/2022   PT End of Session - 03/02/22 1114     Visit Number 1    Number of Visits 4    Date for PT Re-Evaluation 04/13/22    Authorization Type self pay    PT Start Time 1030    PT Stop Time 1110    PT Time Calculation (min) 40 min    Activity Tolerance Patient tolerated treatment well    Behavior During Therapy WFL for tasks assessed/performed             Past Medical History:  Diagnosis Date   Atypical chest pain    secondary to GERD   Diabetes mellitus without complication ([place])    GERD (gastroesophageal reflux disease)    Head ache    Right sided    Obesity    Past Surgical History:  Procedure Laterality Date   ABDOMINAL HYSTERECTOMY     with BSO   BREAST CYST ASPIRATION     Patient Active Problem List   Diagnosis Date Noted   Hearing loss 08/23/2021   Hyperlipidemia associated with type 2 diabetes mellitus ([place]) 11/30/2020   Controlled type 2 diabetes mellitus without complication, without long-term current use of insulin ([place]) 03/01/2020   Pneumonia due to COVID-19 virus 02/02/2020   GERD (gastroesophageal reflux disease) 02/02/2020   Hypokalemia 02/02/2020   Hypoalbuminemia 02/02/2020   Prolonged QT interval 02/02/2020   Class 2 obesity with body mass index (BMI) of 36.0 to 36.9 in adult 02/02/2020   Family history of colon cancer 04/08/2011   BACK PAIN 07/18/2010   Chronic headaches 06/18/2007    PCP: Beverly Townsend  REFERRING PROVIDER: Arther Townsend   REFERRING DIAG: ankle sprain   THERAPY DIAG:  Acute left ankle pain - Plan: PT plan of care cert/re-cert  Difficulty in walking, not elsewhere classified - Plan: PT plan of care cert/re-cert  Stiffness of left ankle, not elsewhere classified - Plan: PT plan of care cert/re-cert  Rationale for Evaluation and Treatment  Rehabilitation  ONSET DATE: 9.15.23  SUBJECTIVE:  SUBJECTIVE STATEMENT: Beverly Townsend states that she inverted the foot when stepping down out of a trailer on 9/15. She is now walking with a brace on.  She can walk and stand for hours but she is still having some shooting pains.    PERTINENT HISTORY: Back pain   PAIN:  Are you having pain? Yes: Pain location: 0; worst pain is a 10/10 but only last for a minute. Pain description: shooting  Aggravating factors: no rhyme or reason  Relieving factors: unknown  PRECAUTIONS: None  WEIGHT BEARING RESTRICTIONS No  FALLS:  Has patient fallen in last 6 months? Yes. Number of falls 1  LIVING ENVIRONMENT: Lives with: lives with their family Lives in: House/apartment Stairs: Yes: Internal: 14 steps; on right going up unable to go up reciprocally Has following equipment at home: None  OCCUPATION: PT works part time 7 hour shifts   PLOF: [place]  less pain and to be able to walk better.    OBJECTIVE:   DIAGNOSTIC FINDINGS:  IMPRESSION: 3 mm calcific density adjacent to the tip of lateral malleolus suggests recent or old avulsion fracture. Small plantar spur is seen in calcaneus.   EDEMA:  No significant swelling at this time.     PALPATION: Tender   LOWER EXTREMITY ROM:  Active ROM Right eval Left  eval  Hip flexion    Hip extension    Hip abduction    Hip adduction    Hip internal rotation    Hip external rotation    Knee flexion    Knee extension    Ankle dorsiflexion  -3  Ankle plantarflexion  38  Ankle inversion    Ankle eversion     (Blank rows = not tested)  LOWER EXTREMITY MMT:  MMT Right eval Left eval  Hip flexion    Hip extension    Hip abduction    Hip adduction    Hip internal rotation    Hip external rotation    Knee flexion    Knee extension    Ankle dorsiflexion  4  Ankle plantarflexion  4  Ankle inversion  4  Ankle eversion  4    (Blank rows = not  tested)     TODAY'S TREATMENT: 10/123/23: Evaluation review of HEP, to wean from ankle brace.     PATIENT EDUCATION:  Education details: HEP Person educated: Patient Education method: Explanation Education comprehension: verbalized understanding   HOME EXERCISE PROGRAM: Access Code: [email or web address] URL: [email or web address] Date: 03/02/2022 Prepared by: Rayetta Humphrey  Exercises -- Ankle Dorsiflexion with Resistance  - 2 x daily - 7 x weekly - 1 sets - 10 reps - 5" hold - Ankle Eversion with Resistance  - 2 x daily - 7 x weekly - 1 sets - 10 reps - 5" hold - Ankle Inversion with Resistance  - 2 x daily - 7 x weekly - 1 sets - 10 reps - 5" hold - Ankle and Toe Plantarflexion with Resistance  - 2 x daily - 7 x weekly - 1 sets - 10 reps - 5' hold - Heel Raises with Counter Support  - 2 x daily - 7 x weekly - 3 sets - 10 reps - 5' hold - Heel Toe Raises with Counter Support  - 2 x daily - 7 x weekly - 3 sets - 10 reps - 5" hold - Standing Single Leg Stance with Counter Support  - 2 x daily - 7 x weekly - 3 sets - 10 reps - as long as possible hold             - Gastroc Stretch on Wall  - 2 x daily - 7 x weekly - 1 sets - 3 reps - 30 hold ASSESSMENT:  CLINICAL IMPRESSION: Patient is a 51 y.o. F who was seen today for physical therapy evaluation and treatment for left ankle sprain. Evaluation demonstrates decreased ROM, decreased strength, and increased pain.  [name] will benefit from skilled PT to address these issues and maximize her functional ability.     OBJECTIVE IMPAIRMENTS decreased balance, decreased ROM, decreased strength, and pain.   ACTIVITY LIMITATIONS squatting and stairs  PARTICIPATION LIMITATIONS: community activity  REHAB POTENTIAL: Excellent  CLINICAL DECISION MAKING: Stable/uncomplicated  EVALUATION COMPLEXITY: Low   GOALS: Goals reviewed with patient? No  SHORT TERM GOALS: Target date: 03/16/2022  PT to be walking without a brace   Baseline: Goal status: INITIAL  2.  Lt ankle pain down to no greater than a 2/10 Baseline:  Goal status: INITIAL  3.  I in HEP to allow the above to occur. Baseline:  Goal status: INITIAL    LONG TERM GOALS: Target date: 03/30/2022   PT to be I in an advanced HEP to have pt pain-free Baseline:  Goal status: INITIAL  2.  PT ankle ROM wfl  Baseline:  Goal status: INITIAL  3.  PT ankle strength 5/5 to allow pt to go up and down 10 steps in a reciprocal manner Baseline:  Goal status: INITIAL     PLAN: PT FREQUENCY: one every other week  PT DURATION: 2 weeks  PLANNED INTERVENTIONS: Therapeutic exercises, Therapeutic activity, Balance training, Patient/Family education, and Self Care  PLAN FOR NEXT SESSION: vector stance, heel walk, toe walk other higher ankle activity give as HEP as pt is self pay.   Rayetta Humphrey, PT CLT [phone]  03/02/2022, 11:15 AM

## 2022-03-07 NOTE — Progress Notes (Deleted)
HPI:  [name] is a 51 y.o. female, who is here today to follow on recent visit.  Review of Systems Rest see pertinent positives and negatives per HPI.  Current Outpatient Medications on File Prior to Visit  Medication Sig Dispense Refill   acetaminophen (TYLENOL) 500 MG tablet Take 1,000 mg by mouth every 6 (six) hours as needed for mild pain or fever.     fluticasone (FLONASE) 50 MCG/ACT nasal spray Place 1 spray into both nostrils 2 (two) times daily. 16 g 3   ibuprofen (ADVIL) 600 MG tablet Take 1 tablet (600 mg total) by mouth every 8 (eight) hours as needed. 21 tablet 0   methocarbamol (ROBAXIN) 500 MG tablet Take 1 tablet (500 mg total) by mouth daily as needed for muscle spasms. 30 tablet 1   pravastatin (PRAVACHOL) 20 MG tablet TAKE 1 TABLET BY MOUTH EVERY DAY 30 tablet 6   SUMAtriptan (IMITREX) 50 MG tablet Take 1 tablet (50 mg total) by mouth daily as needed for migraine. May repeat in 2 hours if headache persists or recurs. 10 tablet 0   valACYclovir (VALTREX) 1000 MG tablet TAKE 1 TABLET BY MOUTH DAILY FOR 5 DAYS.REPEAT TREATMENT FOR FUTURE LESIONS WITHIN 48 HOURS AND AS NEEDED 20 tablet 0   No current facility-administered medications on file prior to visit.    Past Medical History:  Diagnosis Date   Atypical chest pain    secondary to GERD   Diabetes mellitus without complication (HCC)    GERD (gastroesophageal reflux disease)    Head ache    Right sided    Obesity    No Known Allergies  Social History   Socioeconomic History   Marital status: Married    Spouse name: Not on file   Number of children: 1   Years of education: Not on file   Highest education level: Bachelor's degree (e.g., BA, AB, BS)  Occupational History   Occupation: CNA  Tobacco Use   Smoking status: Never   Smokeless tobacco: Never  Vaping Use   Vaping Use: Never used  Substance and Sexual Activity   Alcohol use: No    Alcohol/week: 0.0 standard drinks of alcohol    Drug use: No   Sexual activity: Yes    Birth control/protection: Surgical  Other Topics Concern   Not on file  Social History Narrative   She is a CNA at [employer]    Married for 51 years    50 year old boy    She likes to cook and entertain.    Social Determinants of Health   Financial Resource Strain: Unknown (08/23/2021)   Overall Financial Resource Strain (CARDIA)    Difficulty of Paying Living Expenses: Patient refused  Food Insecurity: No Food Insecurity (09/22/2021)   Hunger Vital Sign    Worried About Running Out of Food in the Last Year: Never true    Ran Out of Food in the Last Year: Never true  Transportation Needs: No Transportation Needs (09/22/2021)   PRAPARE - Hydrologist (Medical): No    Lack of Transportation (Non-Medical): No  Physical Activity: Insufficiently Active (08/23/2021)   Exercise Vital Sign    Days of Exercise per Week: 3 days    Minutes of Exercise per Session: 40 min  Stress: No Stress Concern Present (08/23/2021)   [place]    Feeling of Stress : Not at all  Social  Connections: Unknown (08/23/2021)   Social Connection and Isolation Panel [NHANES]    Frequency of Communication with Friends and Family: Patient refused    Frequency of Social Gatherings with Friends and Family: Patient refused    Attends Religious Services: Patient refused    Active Member of Clubs or Organizations: Patient refused    Attends Archivist Meetings: Not on file    Marital Status: Patient refused    There were no vitals filed for this visit. There is no height or weight on file to calculate BMI.  Physical Exam  ASSESSMENT AND PLAN:   There are no diagnoses linked to this encounter.  No orders of the defined types were placed in this encounter.   No problem-specific Assessment & Plan notes found for this encounter.   No follow-ups on file.   Betty G.  Martinique, MD  [facility]. [place] office.

## 2022-03-09 ENCOUNTER — Ambulatory Visit: Payer: Self-pay | Admitting: Family Medicine

## 2022-03-15 ENCOUNTER — Encounter (HOSPITAL_COMMUNITY): Payer: Self-pay | Admitting: Physical Therapy

## 2022-03-22 ENCOUNTER — Encounter (HOSPITAL_COMMUNITY): Payer: Self-pay

## 2022-03-22 ENCOUNTER — Ambulatory Visit (HOSPITAL_COMMUNITY): Payer: Self-pay | Attending: Orthopedic Surgery

## 2022-03-22 DIAGNOSIS — M25672 Stiffness of left ankle, not elsewhere classified: Secondary | ICD-10-CM | POA: Insufficient documentation

## 2022-03-22 DIAGNOSIS — R262 Difficulty in walking, not elsewhere classified: Secondary | ICD-10-CM | POA: Insufficient documentation

## 2022-03-22 DIAGNOSIS — M25572 Pain in left ankle and joints of left foot: Secondary | ICD-10-CM | POA: Insufficient documentation

## 2022-03-22 NOTE — Therapy (Signed)
OUTPATIENT PHYSICAL THERAPY LOWER EXTREMITY TREATMENT   Patient Name: Beverly Townsend MRN: [id number] DOB:[birth year], 51 y.o., female Today's Date: 03/22/2022   PT End of Session - 03/22/22 1532     Visit Number 2    Number of Visits 4    Date for PT Re-Evaluation 04/13/22    Authorization Type self pay    PT Start Time 1434    PT Stop Time 1512    PT Time Calculation (min) 38 min    Activity Tolerance Patient tolerated treatment well    Behavior During Therapy WFL for tasks assessed/performed              Past Medical History:  Diagnosis Date   Atypical chest pain    secondary to GERD   Diabetes mellitus without complication ([place])    GERD (gastroesophageal reflux disease)    Head ache    Right sided    Obesity    Past Surgical History:  Procedure Laterality Date   ABDOMINAL HYSTERECTOMY     with BSO   BREAST CYST ASPIRATION     Patient Active Problem List   Diagnosis Date Noted   Hearing loss 08/23/2021   Hyperlipidemia associated with type 2 diabetes mellitus ([place]) 11/30/2020   Controlled type 2 diabetes mellitus without complication, without long-term current use of insulin ([place]) 03/01/2020   Pneumonia due to COVID-19 virus 02/02/2020   GERD (gastroesophageal reflux disease) 02/02/2020   Hypokalemia 02/02/2020   Hypoalbuminemia 02/02/2020   Prolonged QT interval 02/02/2020   Class 2 obesity with body mass index (BMI) of 36.0 to 36.9 in adult 02/02/2020   Family history of colon cancer 04/08/2011   BACK PAIN 07/18/2010   Chronic headaches 06/18/2007    PCP: Betty Martinique  REFERRING PROVIDER: Arther Abbott  Next apt 03/29/2022 REFERRING DIAG: ankle sprain   THERAPY DIAG:  Acute left ankle pain  Difficulty in walking, not elsewhere classified  Stiffness of left ankle, not elsewhere classified  Rationale for Evaluation and Treatment Rehabilitation  ONSET DATE: 9.15.23  SUBJECTIVE:  SUBJECTIVE STATEMENT: 03/22/22:  Pt reports she is  feeling good today, no reports of pain today.  Has began the HEP without question, ankle feels stiff today.  Has been working on weaning out of ankle brace, removed prior session today.    Eval subjective: Beverly Townsend states that she inverted the foot when stepping down out of a trailer on 9/15. She is now walking with a brace on.  She can walk and stand for hours but she is still having some shooting pains.    PERTINENT HISTORY: Back pain   PAIN:  Are you having pain? Yes: Pain location: 0; worst pain is a 10/10 but only last for a minute. Pain description: shooting  Aggravating factors: no rhyme or reason  Relieving factors: unknown  PRECAUTIONS: None  WEIGHT BEARING RESTRICTIONS No  FALLS:  Has patient fallen in last 6 months? Yes. Number of falls 1  LIVING ENVIRONMENT: Lives with: lives with their family Lives in: House/apartment Stairs: Yes: Internal: 14 steps; on right going up unable to go up reciprocally Has following equipment at home: None  OCCUPATION: PT works part time 7 hour shifts   PLOF: [place]  less pain and to be able to walk better.    OBJECTIVE:   DIAGNOSTIC FINDINGS:  IMPRESSION: 3 mm calcific density adjacent to the tip of lateral malleolus suggests recent or old avulsion fracture. Small plantar spur is seen in calcaneus.  EDEMA:  No significant swelling at this time.     PALPATION: Tender   LOWER EXTREMITY ROM:  Active ROM Right eval Left eval  Hip flexion    Hip extension    Hip abduction    Hip adduction    Hip internal rotation    Hip external rotation    Knee flexion    Knee extension    Ankle dorsiflexion  -3  Ankle plantarflexion  38  Ankle inversion    Ankle eversion     (Blank rows = not tested)  LOWER EXTREMITY MMT:  MMT Right eval Left eval  Hip flexion    Hip extension    Hip abduction    Hip adduction    Hip internal rotation    Hip external rotation    Knee flexion    Knee extension     Ankle dorsiflexion  4  Ankle plantarflexion  4  Ankle inversion  4  Ankle eversion  4    (Blank rows = not tested)     TODAY'S TREATMENT: 03/22/22: Reviewed goals, compliance with HEP  Heel and toe raises with 1 HHA 10x each SLS Lt 17" Rt 19" Vector stance 3x  5" Heel and toe walking 3RT  Seated BAPS board L3 10x DF/PF; Inv/Ev, CW/CCW 5x  10/123/23: Evaluation review of HEP, to wean from ankle brace.     PATIENT EDUCATION:  Education details: HEP Person educated: Patient Education method: Explanation Education comprehension: verbalized understanding   HOME EXERCISE PROGRAM: Access Code: [email or web address] URL: [email or web address] Date: 03/02/2022 Prepared by: Rayetta Humphrey 03/22/22: heel/toe walking and vector stance. Exercises -- Ankle Dorsiflexion with Resistance  - 2 x daily - 7 x weekly - 1 sets - 10 reps - 5" hold - Ankle Eversion with Resistance  - 2 x daily - 7 x weekly - 1 sets - 10 reps - 5" hold - Ankle Inversion with Resistance  - 2 x daily - 7 x weekly - 1 sets - 10 reps - 5" hold - Ankle and Toe Plantarflexion with Resistance  - 2 x daily - 7 x weekly - 1 sets - 10 reps - 5' hold - Heel Raises with Counter Support  - 2 x daily - 7 x weekly - 3 sets - 10 reps - 5' hold - Heel Toe Raises with Counter Support  - 2 x daily - 7 x weekly - 3 sets - 10 reps - 5" hold - Standing Single Leg Stance with Counter Support  - 2 x daily - 7 x weekly - 3 sets - 10 reps - as long as possible hold             - Gastroc Stretch on Wall  - 2 x daily - 7 x weekly - 1 sets - 3 reps - 30 hold ASSESSMENT:  CLINICAL IMPRESSION: Patient is a 51 y.o. F who was seen today for physical therapy evaluation and treatment for left ankle sprain. Evaluation demonstrates decreased ROM, decreased strength, and increased pain.  Beverly Townsend will benefit from skilled PT to address these issues and maximize her functional ability.    Reviewed goals, educated importance of HEP  compliance, pt able to recall and demonstrate appropriate mechanics with current exercises.  Added balance and ROM based exercises with good demonstration noted.  Able to complete all exercises with no reports of pain, noted some gastroc fatigue with additional toe walking.  Added exercises to HEP for balance and ankle stability, printout given to pt.  OBJECTIVE IMPAIRMENTS decreased balance, decreased ROM, decreased strength, and pain.   ACTIVITY LIMITATIONS squatting and stairs  PARTICIPATION LIMITATIONS: community activity  REHAB POTENTIAL: Excellent  CLINICAL DECISION MAKING: Stable/uncomplicated  EVALUATION COMPLEXITY: Low   GOALS: Goals reviewed with patient? No  SHORT TERM GOALS: Target date: 04/05/2022  PT to be walking without a brace  Baseline: Goal status: IN PROGRESS  2.  Lt ankle pain down to no greater than a 2/10 Baseline:  Goal status: IN PROGRESS  3.  I in HEP to allow the above to occur. Baseline:  Goal status: IN PROGRESS    LONG TERM GOALS: Target date: 04/19/2022   PT to be I in an advanced HEP to have pt pain-free Baseline:  Goal status: IN PROGRESS  2.  PT ankle ROM wfl Baseline:  Goal status: IN PROGRESS  3.  PT ankle strength 5/5 to allow pt to go up and down 10 steps in a reciprocal manner Baseline:  Goal status: IN PROGRESS     PLAN: PT FREQUENCY: one every other week  PT DURATION: 2 weeks  PLANNED INTERVENTIONS: Therapeutic exercises, Therapeutic activity, Balance training, Patient/Family education, and Self Care  PLAN FOR NEXT SESSION: add yoga stability exercises next session and other higher ankle activity give as HEP as pt is self pay.   Ihor Austin, LPTA/CLT; [facility] [phone]  03/22/2022

## 2022-03-28 ENCOUNTER — Ambulatory Visit (HOSPITAL_COMMUNITY): Payer: Self-pay | Admitting: Physical Therapy

## 2022-03-28 NOTE — Progress Notes (Deleted)
HPI: [name] is a 51 y.o. female, who is here today for chronic disease management.  Last seen on 08/23/2021.  *** Review of Systems Rest of ROS see pertinent positives and negatives in HPI.  Current Outpatient Medications on File Prior to Visit  Medication Sig Dispense Refill   acetaminophen (TYLENOL) 500 MG tablet Take 1,000 mg by mouth every 6 (six) hours as needed for mild pain or fever.     fluticasone (FLONASE) 50 MCG/ACT nasal spray Place 1 spray into both nostrils 2 (two) times daily. 16 g 3   ibuprofen (ADVIL) 600 MG tablet Take 1 tablet (600 mg total) by mouth every 8 (eight) hours as needed. 21 tablet 0   methocarbamol (ROBAXIN) 500 MG tablet Take 1 tablet (500 mg total) by mouth daily as needed for muscle spasms. 30 tablet 1   pravastatin (PRAVACHOL) 20 MG tablet TAKE 1 TABLET BY MOUTH EVERY DAY 30 tablet 6   SUMAtriptan (IMITREX) 50 MG tablet Take 1 tablet (50 mg total) by mouth daily as needed for migraine. May repeat in 2 hours if headache persists or recurs. 10 tablet 0   valACYclovir (VALTREX) 1000 MG tablet TAKE 1 TABLET BY MOUTH DAILY FOR 5 DAYS.REPEAT TREATMENT FOR FUTURE LESIONS WITHIN 48 HOURS AND AS NEEDED 20 tablet 0   No current facility-administered medications on file prior to visit.    Past Medical History:  Diagnosis Date   Atypical chest pain    secondary to GERD   Diabetes mellitus without complication (HCC)    GERD (gastroesophageal reflux disease)    Head ache    Right sided    Obesity    No Known Allergies  Social History   Socioeconomic History   Marital status: Married    Spouse name: Not on file   Number of children: 1   Years of education: Not on file   Highest education level: Bachelor's degree (e.g., BA, AB, BS)  Occupational History   Occupation: CNA  Tobacco Use   Smoking status: Never   Smokeless tobacco: Never  Vaping Use   Vaping Use: Never used  Substance and Sexual Activity   Alcohol use: No     Alcohol/week: 0.0 standard drinks of alcohol   Drug use: No   Sexual activity: Yes    Birth control/protection: Surgical  Other Topics Concern   Not on file  Social History Narrative   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    Social Determinants of Health   Financial Resource Strain: Unknown (08/23/2021)   Overall Financial Resource Strain (CARDIA)    Difficulty of Paying Living Expenses: Patient refused  Food Insecurity: No Food Insecurity (09/22/2021)   Hunger Vital Sign    Worried About Running Out of Food in the Last Year: Never true    Ran Out of Food in the Last Year: Never true  Transportation Needs: No Transportation Needs (09/22/2021)   PRAPARE - Hydrologist (Medical): No    Lack of Transportation (Non-Medical): No  Physical Activity: Insufficiently Active (08/23/2021)   Exercise Vital Sign    Days of Exercise per Week: 3 days    Minutes of Exercise per Session: 40 min  Stress: No Stress Concern Present (08/23/2021)   [place]    Feeling of  Stress : Not at all  Social Connections: Unknown (08/23/2021)   Social Connection and Isolation Panel [NHANES]    Frequency of Communication with Friends and Family: Patient refused    Frequency of Social Gatherings with Friends and Family: Patient refused    Attends Religious Services: Patient refused    Marine scientist or Organizations: Patient refused    Attends Archivist Meetings: Not on file    Marital Status: Patient refused    There were no vitals filed for this visit. There is no height or weight on file to calculate BMI.  Physical Exam  ASSESSMENT AND PLAN:  Diagnoses and all orders for this visit:  Controlled type 2 diabetes mellitus without complication, without long-term current use of insulin ([place])  Hyperlipidemia associated with type 2  diabetes mellitus ([place])    No orders of the defined types were placed in this encounter.   No problem-specific Assessment & Plan notes found for this encounter.   No follow-ups on file.  Beverly G. Martinique, MD  [facility]. [place] office.

## 2022-03-29 ENCOUNTER — Ambulatory Visit: Payer: Self-pay | Admitting: Orthopedic Surgery

## 2022-03-30 ENCOUNTER — Ambulatory Visit: Payer: Self-pay | Admitting: Family Medicine

## 2022-03-30 DIAGNOSIS — E119 Type 2 diabetes mellitus without complications: Secondary | ICD-10-CM

## 2022-03-30 DIAGNOSIS — E1169 Type 2 diabetes mellitus with other specified complication: Secondary | ICD-10-CM

## 2022-04-06 ENCOUNTER — Encounter (HOSPITAL_COMMUNITY): Payer: Self-pay

## 2022-04-06 ENCOUNTER — Telehealth (HOSPITAL_COMMUNITY): Payer: Self-pay

## 2022-04-06 NOTE — Telephone Encounter (Signed)
No show, called and left message concerning missed apt today. Reminded next apt date and time with contact information if needs to cancel/reschedule apts in future.   Ihor Austin, LPTA/CLT; [facility] [phone]

## 2022-04-10 ENCOUNTER — Telehealth: Payer: Self-pay

## 2022-04-10 NOTE — Telephone Encounter (Signed)
---  Caller states her symptoms began a week ago. Her nasal congestion is clear. She also has a dry cough with occasional mucus. Denies fever, urinating regularly.  04/10/2022 8:21:17 [place], RN, April  Care Advice Given Per [facility]: FOR A RUNNY NOSE - BLOW YOUR NOSE: CALL BACK IF: * Fever lasts over 3 days * You become worse

## 2022-04-18 NOTE — Progress Notes (Unsigned)
HPI: Beverly Townsend is a 51 y.o. female, who is here today for chronic disease management. Last seen on 08/23/21 Since her last visit she was evaluated for left ankle pain,according to pt, treated for small fracture. Completed PT and having no pain.  Diabetes Mellitus II: DM II: Dx'ed in 2019.  Her A1C  was 6.5 in 08/2021. Negative for symptoms of hypoglycemia, polyuria, polydipsia, numbness extremities, foot ulcers/trauma  Lab Results  Component Value Date   MICROALBUR <0.7 08/23/2021   She has had better adherence to her diet and exercise regimen.  On non pharmacologic treatment. She mentions occasional fluctuations in her BS levels, with readings ranging from 89 to 159, but states that these fluctuations are infrequent.  Today she is c/o dry cough and occasional clear sputum that began a week before Thanksgiving. The patient denies any dyspnea or wheezing and states that her O2 saturation levels at home have been consistently between 96 and 98%.  States that she experiences occasional headaches, described as throbbing and localized to the left temple. Intermittent, can be once per week or every 3 months. She has not identified exacerbating or alleviating factors. Headache can last for up to two days.  She reports that taking two Tylenol helps.  There is no associated nausea,photophobia, or vomiting.  She is interested in a referral to a dietician for weight management. Frustrated because cannot lose wt despite regular physical activity and following a healthful diet.  HLD on Pravastatin 20 mg daily. Lab Results  Component Value Date   CHOL 168 08/23/2021   HDL 61.30 08/23/2021   LDLCALC 100 (H) 08/23/2021   TRIG 33.0 08/23/2021   CHOLHDL 3 08/23/2021   Review of Systems  Constitutional:  Negative for activity change, appetite change and fever.  HENT:  Negative for mouth sores and nosebleeds.   Eyes:  Negative for redness and visual disturbance.  Cardiovascular:   Negative for chest pain, palpitations and leg swelling.  Gastrointestinal:  Negative for abdominal pain.       Negative for changes in bowel habits.  Genitourinary:  Negative for decreased urine volume and hematuria.  Allergic/Immunologic: Positive for environmental allergies.  Neurological:  Negative for syncope, facial asymmetry and weakness.  Psychiatric/Behavioral:  Negative for confusion. The patient is nervous/anxious.   See other pertinent positives and negatives in HPI.  Current Outpatient Medications on File Prior to Visit  Medication Sig Dispense Refill   acetaminophen (TYLENOL) 500 MG tablet Take 1,000 mg by mouth every 6 (six) hours as needed for mild pain or fever.     fluticasone (FLONASE) 50 MCG/ACT nasal spray Place 1 spray into both nostrils 2 (two) times daily. 16 g 3   ibuprofen (ADVIL) 600 MG tablet Take 1 tablet (600 mg total) by mouth every 8 (eight) hours as needed. 21 tablet 0   methocarbamol (ROBAXIN) 500 MG tablet Take 1 tablet (500 mg total) by mouth daily as needed for muscle spasms. 30 tablet 1   pravastatin (PRAVACHOL) 20 MG tablet TAKE 1 TABLET BY MOUTH EVERY DAY 30 tablet 6   SUMAtriptan (IMITREX) 50 MG tablet Take 1 tablet (50 mg total) by mouth daily as needed for migraine. May repeat in 2 hours if headache persists or recurs. 10 tablet 0   valACYclovir (VALTREX) 1000 MG tablet TAKE 1 TABLET BY MOUTH DAILY FOR 5 DAYS.REPEAT TREATMENT FOR FUTURE LESIONS WITHIN 48 HOURS AND AS NEEDED 20 tablet 0   No current facility-administered medications on file prior to visit.  Past Medical History:  Diagnosis Date   Atypical chest pain    secondary to GERD   Diabetes mellitus without complication (HCC)    GERD (gastroesophageal reflux disease)    Head ache    Right sided    Obesity    No Known Allergies  Social History   Socioeconomic History   Marital status: Married    Spouse name: Not on file   Number of children: 1   Years of education: Not on file    Highest education level: Bachelor's degree (e.g., BA, AB, BS)  Occupational History   Occupation: CNA  Tobacco Use   Smoking status: Never   Smokeless tobacco: Never  Vaping Use   Vaping Use: Never used  Substance and Sexual Activity   Alcohol use: No    Alcohol/week: 0.0 standard drinks of alcohol   Drug use: No   Sexual activity: Yes    Birth control/protection: Surgical  Other Topics Concern   Not on file  Social History Narrative   She is a CNA at [employer]    Married for 88 years    50 year old boy    She likes to cook and entertain.    Social Determinants of Health   Financial Resource Strain: Unknown (08/23/2021)   Overall Financial Resource Strain (CARDIA)    Difficulty of Paying Living Expenses: Patient refused  Food Insecurity: No Food Insecurity (09/22/2021)   Hunger Vital Sign    Worried About Running Out of Food in the Last Year: Never true    Ran Out of Food in the Last Year: Never true  Transportation Needs: No Transportation Needs (09/22/2021)   PRAPARE - Hydrologist (Medical): No    Lack of Transportation (Non-Medical): No  Physical Activity: Insufficiently Active (08/23/2021)   Exercise Vital Sign    Days of Exercise per Week: 3 days    Minutes of Exercise per Session: 40 min  Stress: No Stress Concern Present (08/23/2021)   [place]    Feeling of Stress : Not at all  Social Connections: Unknown (08/23/2021)   Social Connection and Isolation Panel [NHANES]    Frequency of Communication with Friends and Family: Patient refused    Frequency of Social Gatherings with Friends and Family: Patient refused    Attends Religious Services: Patient refused    Active Member of Clubs or Organizations: Patient refused    Attends Archivist Meetings: Not on file    Marital Status: Patient refused   Vitals:   04/20/22 1120  BP: 128/80  Pulse: 81  Resp: 12  Temp:  98.5 F (36.9 C)  SpO2: 98%   Wt Readings from Last 3 Encounters:  04/20/22 188 lb 2 oz (85.3 kg)  02/26/22 183 lb (83 kg)  02/13/22 170 lb (77.1 kg)  Body mass index is 35.55 kg/m.  Physical Exam Vitals and nursing note reviewed.  Constitutional:      General: She is not in acute distress.    Appearance: She is well-developed.  HENT:     Head: Normocephalic and atraumatic.     Right Ear: Tympanic membrane, ear canal and external ear normal.     Left Ear: Tympanic membrane, ear canal and external ear normal.     Nose:     Right Turbinates: Enlarged.     Left Turbinates: Enlarged.     Mouth/Throat:     Mouth: Mucous membranes are moist.  Pharynx: Oropharynx is clear.  Eyes:     Conjunctiva/sclera: Conjunctivae normal.  Cardiovascular:     Rate and Rhythm: Normal rate and regular rhythm.     Pulses:          Posterior tibial pulses are 2+ on the right side and 2+ on the left side.     Heart sounds: No murmur heard. Pulmonary:     Effort: Pulmonary effort is normal. No respiratory distress.     Breath sounds: Normal breath sounds.  Abdominal:     Palpations: Abdomen is soft. There is no hepatomegaly or mass.     Tenderness: There is no abdominal tenderness.  Musculoskeletal:     Right lower leg: No edema.     Left lower leg: No edema.  Lymphadenopathy:     Cervical: No cervical adenopathy.  Skin:    General: Skin is warm.     Findings: No erythema or rash.  Neurological:     General: No focal deficit present.     Mental Status: She is alert and oriented to person, place, and time.     Cranial Nerves: No cranial nerve deficit.     Gait: Gait normal.  Psychiatric:        Mood and Affect: Affect normal. Mood is anxious.   ASSESSMENT AND PLAN:  Ms.Abryana was seen today for follow-up.  Diagnoses and all orders for this visit: Lab Results  Component Value Date   HGBA1C 5.8 04/20/2022   Hyperlipidemia associated with type 2 diabetes mellitus ([place]) Assessment  & Plan: LDL 100 in 08/2021. She is not fasting today. Continue Pravastatin 20 mg daily. Will plan on FLP next visit.   Controlled type 2 diabetes mellitus without complication, without long-term current use of insulin (HCC) Assessment & Plan: HgA1C at goal, it went from 6.5 to 5.8. Continue non pharmacologic treatment. Annual eye exam, periodic dental and foot care recommended. F/U in 5-6 months  Orders: -     POCT glycosylated hemoglobin (Hb A1C)  Cough, unspecified type Assessment & Plan: We discussed possible etiologies. Most likely residual after recent URI and exacerbated by allergies. Lung auscultation negative today.  Monitor for new symptoms. If not any better in 2 weeks, CXR ca be arranged.    Class 2 severe obesity with serious comorbidity and body mass index (BMI) of 36.0 to 36.9 in adult, unspecified obesity type [facility]) Assessment & Plan: Has gained a couple pounds sine her last visit in 08/2021. Consistency with healthy diet and physical activity encouraged. Referral to healthy wt clinic placed.  Orders: -     Amb ref to Medical Nutrition Therapy-MNT  Need for Tdap vaccination -     Tdap vaccine greater than or equal to 7yo IM  Migraine without aura and without status migrainosus, not intractable Assessment & Plan: We discussed differential Dx's. Hx and examination do not suggest a serious process, mild common migraine vs tension headache. Continue Tylenol 500 mg daily as needed. Monitor for changes in intensity or frequency or new associated symptoms.   Return in about 6 months (around 10/20/2022) for chronic problems.  Jerrin Recore G. Martinique, MD  [facility]. [place] office.

## 2022-04-19 ENCOUNTER — Ambulatory Visit: Payer: Self-pay | Admitting: Orthopedic Surgery

## 2022-04-20 ENCOUNTER — Encounter (HOSPITAL_COMMUNITY): Payer: Self-pay

## 2022-04-20 ENCOUNTER — Ambulatory Visit (HOSPITAL_COMMUNITY): Payer: Self-pay | Attending: Orthopedic Surgery

## 2022-04-20 ENCOUNTER — Ambulatory Visit (INDEPENDENT_AMBULATORY_CARE_PROVIDER_SITE_OTHER): Payer: Self-pay | Admitting: Family Medicine

## 2022-04-20 ENCOUNTER — Encounter: Payer: Self-pay | Admitting: Family Medicine

## 2022-04-20 VITALS — BP 128/80 | HR 81 | Temp 98.5°F | Resp 12 | Ht 61.0 in | Wt 188.1 lb

## 2022-04-20 DIAGNOSIS — E785 Hyperlipidemia, unspecified: Secondary | ICD-10-CM

## 2022-04-20 DIAGNOSIS — R262 Difficulty in walking, not elsewhere classified: Secondary | ICD-10-CM | POA: Insufficient documentation

## 2022-04-20 DIAGNOSIS — Z6836 Body mass index (BMI) 36.0-36.9, adult: Secondary | ICD-10-CM

## 2022-04-20 DIAGNOSIS — E119 Type 2 diabetes mellitus without complications: Secondary | ICD-10-CM

## 2022-04-20 DIAGNOSIS — E1169 Type 2 diabetes mellitus with other specified complication: Secondary | ICD-10-CM

## 2022-04-20 DIAGNOSIS — M25672 Stiffness of left ankle, not elsewhere classified: Secondary | ICD-10-CM | POA: Insufficient documentation

## 2022-04-20 DIAGNOSIS — Z23 Encounter for immunization: Secondary | ICD-10-CM

## 2022-04-20 DIAGNOSIS — R059 Cough, unspecified: Secondary | ICD-10-CM

## 2022-04-20 DIAGNOSIS — G43009 Migraine without aura, not intractable, without status migrainosus: Secondary | ICD-10-CM

## 2022-04-20 DIAGNOSIS — M25572 Pain in left ankle and joints of left foot: Secondary | ICD-10-CM | POA: Insufficient documentation

## 2022-04-20 LAB — POCT GLYCOSYLATED HEMOGLOBIN (HGB A1C): HbA1c, POC (prediabetic range): 5.8 % (ref 5.7–6.4)

## 2022-04-20 NOTE — Patient Instructions (Addendum)
A few things to remember from today's visit:  Hyperlipidemia associated with type 2 diabetes mellitus ([place])  Controlled type 2 diabetes mellitus without complication, without long-term current use of insulin ([place]) - Plan: POC HgB A1c  Cough, unspecified type  Class 2 severe obesity with serious comorbidity and body mass index (BMI) of 36.0 to 36.9 in adult, unspecified obesity type ([place]) - Plan: Amb ref to Medical Nutrition Therapy-MNT  Try Flonase nasal spray daily at bedtime for 10-14 days and nasal saline irrigations. If cough is not any better in 2 weeks or gets worse, we can arrange a chest X ray. Continue monitoring headaches for changes.  If you need refills for medications you take chronically, please call your pharmacy. Do not use My Chart to request refills or for acute issues that need immediate attention. If you send a my chart message, it may take a few days to be addressed, specially if I am not in the office.  Please be sure medication list is accurate. If a new problem present, please set up appointment sooner than planned today.

## 2022-04-20 NOTE — Patient Instructions (Signed)
Warrior I    In wide stride, rotate back leg out 20, grounding foot, hands in prayer position in front of chest. Bend front leg 90. Reaching over head, look up. Hold for 30 breaths. Repeat, other leg forward. BEGINNER: Support body with hands on hips.   Copyright  VHI. All rights reserved.   Warrior II    In wide stance, arms extended out, rotate right leg out 90, left leg in 20. Bend right leg 90 in line with foot. Keep left foot flat, hips square to front. Turn head right. Hold for 30 breaths. Repeat on other side. NOTE: Keep bent knee behind line of toes.   http://yg.exer.us/17   Copyright  VHI. All rights reserved.   Single Leg Balance: Eyes Open    Stand on right leg with eyes open. Hold 30 seconds. 5 reps 2 times per day.  http://ggbe.exer.us/5   Copyright  VHI. All rights reserved.

## 2022-04-20 NOTE — Assessment & Plan Note (Signed)
HgA1C at goal, it went from 6.5 to 5.8. Continue non pharmacologic treatment. Annual eye exam, periodic dental and foot care recommended. F/U in 5-6 months

## 2022-04-20 NOTE — Therapy (Addendum)
OUTPATIENT PHYSICAL THERAPY LOWER EXTREMITY TREATMENT   Patient Name: Beverly Townsend MRN: [id number] DOB:Jan 08, 1971, 51 y.o., female Today's Date: 04/20/2022   PT End of Session - 04/20/22 0741     Visit Number 3    Number of Visits 4    Date for PT Re-Evaluation 05/04/22    Authorization Type self pay    PT Start Time 0734    PT Stop Time 0818    PT Time Calculation (min) 44 min    Activity Tolerance Patient tolerated treatment well    Behavior During Therapy [facility] for tasks assessed/performed               Past Medical History:  Diagnosis Date   Atypical chest pain    secondary to GERD   Diabetes mellitus without complication ([place])    GERD (gastroesophageal reflux disease)    Head ache    Right sided    Obesity    Past Surgical History:  Procedure Laterality Date   ABDOMINAL HYSTERECTOMY     with BSO   BREAST CYST ASPIRATION     Patient Active Problem List   Diagnosis Date Noted   Hearing loss 08/23/2021   Hyperlipidemia associated with type 2 diabetes mellitus ([place]) 11/30/2020   Controlled type 2 diabetes mellitus without complication, without long-term current use of insulin ([place]) 03/01/2020   Pneumonia due to COVID-19 virus 02/02/2020   GERD (gastroesophageal reflux disease) 02/02/2020   Hypokalemia 02/02/2020   Hypoalbuminemia 02/02/2020   Prolonged QT interval 02/02/2020   Class 2 obesity with body mass index (BMI) of 36.0 to 36.9 in adult 02/02/2020   Family history of colon cancer 04/08/2011   BACK PAIN 07/18/2010   Chronic headaches 06/18/2007    PCP: Betty Martinique  REFERRING PROVIDER: Arther Abbott  Next apt 05/03/22  REFERRING DIAG: ankle sprain   THERAPY DIAG:  Acute left ankle pain  Difficulty in walking, not elsewhere classified  Stiffness of left ankle, not elsewhere classified  Rationale for Evaluation and Treatment Rehabilitation  ONSET DATE: 9.15.23  SUBJECTIVE:  SUBJECTIVE STATEMENT: 04/20/22: Pt stated she mixed up  dates last session.  Reports she has been compliant with HEP 3x daily though continues to have intermittent sharp when non-weight bearing.  Feels her Lt ankle is more invented when standing.  Reports she has purchased Nurse, adult.  Eval subjective: Ms. Schlender states that she inverted the foot when stepping down out of a trailer on 9/15. She is now walking with a brace on.  She can walk and stand for hours but she is still having some shooting pains.    PERTINENT HISTORY: Back pain   PAIN:  Are you having pain? Yes: Pain location: 0; worst pain is a 10/10 but only last for a minute. Pain description: shooting  Aggravating factors: no rhyme or reason  Relieving factors: unknown  PRECAUTIONS: None  WEIGHT BEARING RESTRICTIONS No  FALLS:  Has patient fallen in last 6 months? Yes. Number of falls 1  LIVING ENVIRONMENT: Lives with: lives with their family Lives in: House/apartment Stairs: Yes: Internal: 14 steps; on right going up unable to go up reciprocally Has following equipment at home: None  OCCUPATION: PT works part time 7 hour shifts   PLOF: [place]  less pain and to be able to walk better.    OBJECTIVE:   DIAGNOSTIC FINDINGS:  IMPRESSION: 3 mm calcific density adjacent to the tip of lateral malleolus suggests recent or old avulsion  fracture. Small plantar spur is seen in calcaneus.   EDEMA:  No significant swelling at this time.     PALPATION: Tender   LOWER EXTREMITY ROM:  Active ROM Right eval Left eval Left  04/20/22  Hip flexion     Hip extension     Hip abduction     Hip adduction     Hip internal rotation     Hip external rotation     Knee flexion     Knee extension     Ankle dorsiflexion  -3 12  Ankle plantarflexion  38 48  Ankle inversion     Ankle eversion      (Blank rows = not tested)  LOWER EXTREMITY MMT:  MMT Right eval Left eval Left 04/20/22  Hip flexion     Hip extension     Hip abduction      Hip adduction     Hip internal rotation     Hip external rotation     Knee flexion     Knee extension     Ankle dorsiflexion  4 4+  Ankle plantarflexion  4 4/5  Ankle inversion  4 4+  Ankle eversion  4  4+   (Blank rows = not tested)   1201/23 2MWT: 520 ft  TODAY'S TREATMENT: 04/20/22:   Heel and toe raises with 1 HHA 10x each   Reviewed goals   SLS Lt 18" , Rt 16"   Vector stance 3x 5"   Warrior I and II 2x 30"   Slant board 3x 30"   ROM    MMT   Heel raises up 2 down 1 10x   2MWT 520 ft   Stairs reciprocal pattern 1 HR  03/22/22: Reviewed goals, compliance with HEP  Heel and toe raises with 1 HHA 10x each SLS Lt 17" Rt 19" Vector stance 3x  5" Heel and toe walking 3RT  Seated BAPS board L3 10x DF/PF; Inv/Ev, CW/CCW 5x  10/123/23: Evaluation review of HEP, to wean from ankle brace.     PATIENT EDUCATION:  Education details: HEP Person educated: Patient Education method: Explanation Education comprehension: verbalized understanding   HOME EXERCISE PROGRAM: Access Code: [email or web address] URL: [email or web address] Date: 03/02/2022 Prepared by: Rayetta Humphrey 03/22/22: heel/toe walking and vector stance. Exercises -- Ankle Dorsiflexion with Resistance  - 2 x daily - 7 x weekly - 1 sets - 10 reps - 5" hold - Ankle Eversion with Resistance  - 2 x daily - 7 x weekly - 1 sets - 10 reps - 5" hold - Ankle Inversion with Resistance  - 2 x daily - 7 x weekly - 1 sets - 10 reps - 5" hold - Ankle and Toe Plantarflexion with Resistance  - 2 x daily - 7 x weekly - 1 sets - 10 reps - 5' hold - Heel Raises with Counter Support  - 2 x daily - 7 x weekly - 3 sets - 10 reps - 5' hold - Heel Toe Raises with Counter Support  - 2 x daily - 7 x weekly - 3 sets - 10 reps - 5" hold - Standing Single Leg Stance with Counter Support  - 2 x daily - 7 x weekly - 3 sets - 10 reps - as long as possible hold   - Gastroc Stretch on Wall  - 2 x daily - 7 x weekly - 1 sets - 3  reps - 30 hold 04/20/22: Warrior I and II, vector stance  ASSESSMENT:  CLINICAL IMPRESSION: Pt hasn't been to therapy since 03/22/22, stated she mixed up apt dates last session.  Reviewed goals with some improvements with ankle mobility and strength.  Pt continues to c/o sharp pain lasting short duration.  Pt continues to demonstrate gastroc weakness and difficulty with SLS, progressed strengthening and stability this session with good tolerance.  Pt stated she continues to wear brace 4hrs daily, discussed benefits with weaning off of brace for maximal strengthening.  Increased cadence with 2MWT, ability to complete reciprocal pattern with stairs with good mechanics and no pain.  Advanced HEP this session for ankle stability with printout given.  Pt wishes to continue 1 more session to work on ankle stability and build HEP.       OBJECTIVE IMPAIRMENTS decreased balance, decreased ROM, decreased strength, and pain.   ACTIVITY LIMITATIONS squatting and stairs  PARTICIPATION LIMITATIONS: community activity  REHAB POTENTIAL: Excellent  CLINICAL DECISION MAKING: Stable/uncomplicated  EVALUATION COMPLEXITY: Low   GOALS: Goals reviewed with patient? No  SHORT TERM GOALS: Target date: 05/04/2022  PT to be walking without a brace  Baseline: 04/20/22:  Reports weaning off brace, 4 hrs a day. Goal status: IN PROGRESS  2.  Lt ankle pain down to no greater than a 2/10 Baseline: 12/0/23:  Sharp pain 10/10 for a 5-10" Goal status: IN PROGRESS  3.  I in HEP to allow the above to occur. Baseline: 04/22/22: Reports compliance with HEP daily Goal status: IN PROGRESS    LONG TERM GOALS: Target date: 05/18/2022   PT to be I in an advanced HEP to have pt pain-free Baseline: 04/20/22: Pt reports compliance with advanced HEP, continues to have sharp pain.   Goal status: IN PROGRESS  2.  PT ankle ROM wfl Baseline: 04/20/22: see above Goal status: IN PROGRESS  3.  PT ankle strength 5/5 to  allow pt to go up and down 10 steps in a reciprocal manner Baseline: 04/20/22: see MMT.  Able to demonstrate reciprocal pattern stairs with no difficulty, continues to have gastroc weakness.   Goal status: IN PROGRESS     PLAN: PT FREQUENCY: one every other week  PT DURATION: 2 weeks  PLANNED INTERVENTIONS: Therapeutic exercises, Therapeutic activity, Balance training, Patient/Family education, and Self Care  PLAN FOR NEXT SESSION: add additional yoga stability exercises next session and other higher ankle activity give as HEP as pt is self pay.  Add dynamic stability exercises.  Ihor Austin, LPTA/CLT; [place] Rayetta Humphrey, [place] [phone]  04/20/2022

## 2022-04-20 NOTE — Assessment & Plan Note (Signed)
We discussed differential Dx's. Hx and examination do not suggest a serious process, mild common migraine vs tension headache. Continue Tylenol 500 mg daily as needed. Monitor for changes in intensity or frequency or new associated symptoms.

## 2022-04-21 DIAGNOSIS — R059 Cough, unspecified: Secondary | ICD-10-CM | POA: Insufficient documentation

## 2022-04-21 NOTE — Assessment & Plan Note (Signed)
LDL 100 in 08/2021. She is not fasting today. Continue Pravastatin 20 mg daily. Will plan on FLP next visit.

## 2022-04-21 NOTE — Assessment & Plan Note (Signed)
We discussed possible etiologies. Most likely residual after recent URI and exacerbated by allergies. Lung auscultation negative today.  Monitor for new symptoms. If not any better in 2 weeks, CXR ca be arranged.

## 2022-04-21 NOTE — Assessment & Plan Note (Signed)
Has gained a couple pounds sine her last visit in 08/2021. Consistency with healthy diet and physical activity encouraged. Referral to healthy wt clinic placed.

## 2022-05-02 NOTE — Addendum Note (Signed)
Addended by: Leeroy Cha on: 05/02/2022 11:19 AM   Modules accepted: Orders

## 2022-05-03 ENCOUNTER — Encounter (HOSPITAL_COMMUNITY): Payer: Self-pay

## 2022-05-03 ENCOUNTER — Ambulatory Visit: Payer: Self-pay | Admitting: Orthopedic Surgery

## 2022-05-15 ENCOUNTER — Other Ambulatory Visit: Payer: Self-pay | Admitting: Family Medicine

## 2022-05-15 DIAGNOSIS — A6000 Herpesviral infection of urogenital system, unspecified: Secondary | ICD-10-CM

## 2022-05-16 MED ORDER — VALACYCLOVIR HCL 1 G PO TABS: ORAL_TABLET | ORAL | 2 refills | Status: DC

## 2022-05-24 ENCOUNTER — Encounter (HOSPITAL_COMMUNITY): Payer: Self-pay

## 2022-05-24 ENCOUNTER — Encounter: Payer: Self-pay | Admitting: Orthopedic Surgery

## 2022-05-24 ENCOUNTER — Ambulatory Visit (INDEPENDENT_AMBULATORY_CARE_PROVIDER_SITE_OTHER): Payer: Self-pay | Admitting: Orthopedic Surgery

## 2022-05-24 DIAGNOSIS — S93492D Sprain of other ligament of left ankle, subsequent encounter: Secondary | ICD-10-CM

## 2022-05-24 NOTE — Patient Instructions (Signed)
Continue stretching achilles

## 2022-05-24 NOTE — Progress Notes (Signed)
FOLLOW UP   Encounter Diagnosis  Name Primary?   Sprain of anterior talofibular ligament of left ankle, subsequent encounter Yes   Chief Complaint  Patient presents with   Ankle Pain    Left injury 02/03/22      PRIOR TREATMENT: Physical therapy  The patient has been compliant with physical therapy she is gotten a proper to use at home to help with proprioception  Main problem right now she has about a 5 degree dorsiflexion deficit in terms of Achilles tightness on the left  Recommend continued stretching I think she can stop her physical therapy as long as she continues to be compliant and see [place] on an as-needed basis

## 2022-05-25 ENCOUNTER — Encounter (HOSPITAL_COMMUNITY): Payer: Self-pay | Admitting: Physical Therapy

## 2022-05-25 NOTE — Therapy (Signed)
[place], [place], [address] Phone: [phone]   Fax:  [phone]  Patient Details  Name: Beverly Townsend MRN: [id number] Date of Birth: 11-06-1970 Referring Provider:  No ref. provider found  Encounter Date: 05/25/2022  PHYSICAL THERAPY DISCHARGE SUMMARY  Visits from Start of Care: 112/1/24  Current functional level related to goals / functional outcomes: Unknown as pt did not return   Remaining deficits: Unknown as pt did not return   Education / Equipment: Unknown as pt did not return   Patient agrees to discharge. Patient goals were Unknown as pt did not return. Patient is being discharged due to not returning since the last visit.  Rayetta Humphrey, PT CLT [phone]  05/25/2022, 3:18 PM  [place] [address] [place], [place], [address] Phone: [phone]   Fax:  [phone]

## 2022-07-19 ENCOUNTER — Encounter: Payer: Self-pay | Admitting: Radiology

## 2022-07-23 ENCOUNTER — Encounter (HOSPITAL_COMMUNITY): Payer: Self-pay | Admitting: Radiology

## 2022-07-23 ENCOUNTER — Emergency Department (HOSPITAL_COMMUNITY)
Admission: EM | Admit: 2022-07-23 | Discharge: 2022-07-23 | Disposition: A | Discharge status: Home / Self Care | Payer: Self-pay | Attending: Emergency Medicine | Admitting: Emergency Medicine

## 2022-07-23 ENCOUNTER — Other Ambulatory Visit: Payer: Self-pay

## 2022-07-23 ENCOUNTER — Emergency Department (HOSPITAL_COMMUNITY): Payer: Self-pay

## 2022-07-23 DIAGNOSIS — R072 Precordial pain: Secondary | ICD-10-CM | POA: Insufficient documentation

## 2022-07-23 LAB — BASIC METABOLIC PANEL
Anion gap: 9 (ref 5–15)
BUN: 16 mg/dL (ref 6–20)
CO2: 24 mmol/L (ref 22–32)
Calcium: 9.2 mg/dL (ref 8.9–10.3)
Chloride: 105 mmol/L (ref 98–111)
Creatinine, Ser: 0.63 mg/dL (ref 0.44–1.00)
GFR, Estimated: >60 mL/min (ref >60)
Glucose, Bld: 101 mg/dL — ABNORMAL HIGH (ref 70–99)
Potassium: 4.7 mmol/L (ref 3.5–5.1)
Sodium: 138 mmol/L (ref 135–145)

## 2022-07-23 LAB — CBC
HCT: 39.2 % (ref 36.0–46.0)
Hemoglobin: 12.6 g/dL (ref 12.0–15.0)
MCH: 31.3 pg (ref 26.0–34.0)
MCHC: 32.1 g/dL (ref 30.0–36.0)
MCV: 97.5 fL (ref 80.0–100.0)
Platelets: 242 10*3/uL (ref 150–400)
RBC: 4.02 MIL/uL (ref 3.87–5.11)
RDW: 13.4 % (ref 11.5–15.5)
WBC: 5.9 10*3/uL (ref 4.0–10.5)
nRBC: 0 % (ref 0.0–0.2)

## 2022-07-23 LAB — TROPONIN I (HIGH SENSITIVITY): Troponin I (High Sensitivity): <2 ng/L (ref <18)

## 2022-07-23 MED ORDER — ALUM & MAG HYDROXIDE-SIMETH 200-200-20 MG/5ML PO SUSP
30.0000 mL | Freq: Once | ORAL | Status: AC
  Administered 2022-07-23: 30 mL via ORAL
  Filled 2022-07-23: qty 30

## 2022-07-23 MED ORDER — ACETAMINOPHEN 500 MG PO TABS
1000.0000 mg | ORAL_TABLET | Freq: Once | ORAL | Status: AC
Start: 2022-07-23 — End: 2022-07-23
  Administered 2022-07-23: 1000 mg via ORAL
  Filled 2022-07-23: qty 2

## 2022-07-23 MED ORDER — FAMOTIDINE 20 MG PO TABS
20.0000 mg | ORAL_TABLET | Freq: Once | ORAL | Status: AC
Start: 2022-07-23 — End: 2022-07-23
  Administered 2022-07-23: 20 mg via ORAL
  Filled 2022-07-23: qty 1

## 2022-07-23 NOTE — ED Triage Notes (Signed)
Pt states she has pain that started on Friday  in her left neck that goes to her left chest and down her left side. Pt states it went away but came back today.Pt states she took tums thinking maybe it was gas but had no relief.

## 2022-07-23 NOTE — ED Provider Notes (Signed)
[facility] Provider Note   CSN: [id number] Arrival date & time: 07/23/22  1158     History  Chief Complaint  Patient presents with   Chest Pain    Beverly Townsend is a 52 y.o. female.  Patient with c/o chest pain in past few days. States started on Friday (3 days ago) at rest, mid to left chest. Pt thought possible gas, took tums. States pain was there most all day then, but then improved/resolved over weekend, but now persistent all day today. Symptoms at rest, not related to exertion, dull, left sided, non  radiating, not pleuritic. Denies associated sob or unusual doe, nv or diaphoresis. Denies cough or uri symptoms. No fever or chills. No chest wall injury or strain. No leg pain or swelling. No recent surgery, immobility, trauma or travel. Denies fam hx premature cad. Non smoker.   The history is provided by the patient and medical records.  Chest Pain Associated symptoms: no abdominal pain, no back pain, no cough, no fever, no headache, no nausea, no palpitations, no shortness of breath and no vomiting        Home Medications Prior to Admission medications   Medication Sig Start Date End Date Taking? Authorizing Provider  acetaminophen (TYLENOL) 500 MG tablet Take 1,000 mg by mouth every 6 (six) hours as needed for mild pain or fever.    [provider]  fluticasone (FLONASE) 50 MCG/ACT nasal spray Place 1 spray into both nostrils 2 (two) times daily. 11/30/20   [place], Betty G, MD  ibuprofen (ADVIL) 600 MG tablet Take 1 tablet (600 mg total) by mouth every 8 (eight) hours as needed. 02/13/22   Evalee Jefferson, PA-C  methocarbamol (ROBAXIN) 500 MG tablet Take 1 tablet (500 mg total) by mouth daily as needed for muscle spasms. 10/17/21   [place], Betty G, MD  pravastatin (PRAVACHOL) 20 MG tablet TAKE 1 TABLET BY MOUTH EVERY DAY 12/18/21   [place], Betty G, MD  SUMAtriptan (IMITREX) 50 MG tablet Take 1 tablet (50 mg total) by mouth  daily as needed for migraine. May repeat in 2 hours if headache persists or recurs. 07/15/20   [place], Betty G, MD  valACYclovir (VALTREX) 1000 MG tablet TAKE 1 TABLET BY MOUTH DAILY FOR 5 DAYS.REPEAT TREATMENT FOR FUTURE LESIONS WITHIN 48 HOURS AND AS NEEDED 05/16/22   [place], Betty G, MD      Allergies    Patient has no known allergies.    Review of Systems   Review of Systems  Constitutional:  Negative for chills and fever.  HENT:  Negative for sore throat.   Eyes:  Negative for redness.  Respiratory:  Negative for cough and shortness of breath.   Cardiovascular:  Positive for chest pain. Negative for palpitations and leg swelling.  Gastrointestinal:  Negative for abdominal pain, nausea and vomiting.  Genitourinary:  Negative for flank pain.  Musculoskeletal:  Negative for back pain and neck pain.  Skin:  Negative for rash.  Neurological:  Negative for headaches.  Hematological:  Does not bruise/bleed easily.  Psychiatric/Behavioral:  Negative for confusion.     Physical Exam Updated Vital Signs BP 112/64 (BP Location: Right Arm)   Pulse 74   Temp 98.1 F (36.7 C) (Oral)   Resp 18   Ht 1.549 m ('5\' 1"'$ )   Wt 81.6 kg   SpO2 99%   BMI 34.01 kg/m  Physical Exam Vitals and nursing note reviewed.  Constitutional:  Appearance: Normal appearance. She is well-developed.  HENT:     Head: Atraumatic.     Nose: Nose normal.     Mouth/Throat:     Mouth: Mucous membranes are moist.  Eyes:     General: No scleral icterus.    Conjunctiva/sclera: Conjunctivae normal.  Neck:     Trachea: No tracheal deviation.  Cardiovascular:     Rate and Rhythm: Normal rate and regular rhythm.     Pulses: Normal pulses.     Heart sounds: Normal heart sounds. No murmur heard.    No friction rub. No gallop.  Pulmonary:     Effort: Pulmonary effort is normal. No respiratory distress.     Breath sounds: Normal breath sounds.     Comments: Mild chest wall tenderness reproducing symptoms. No  chest wall swelling or skin lesions/rash to area of pain.  Chest:     Chest wall: Tenderness present.  Abdominal:     General: There is no distension.     Palpations: Abdomen is soft.     Tenderness: There is no abdominal tenderness.  Genitourinary:    Comments: No cva tenderness.  Musculoskeletal:        General: No swelling or tenderness.     Cervical back: Normal range of motion and neck supple. No rigidity. No muscular tenderness.     Right lower leg: No edema.     Left lower leg: No edema.  Skin:    General: Skin is warm and dry.     Findings: No rash.  Neurological:     Mental Status: She is alert.     Comments: Alert, speech normal.   Psychiatric:        Mood and Affect: Mood normal.     ED Results / Procedures / Treatments   Labs (all labs ordered are listed, but only abnormal results are displayed) Results for orders placed or performed during the hospital encounter of [id number]  Basic metabolic panel  Result Value Ref Range   Sodium 138 135 - 145 mmol/L   Potassium 4.7 3.5 - 5.1 mmol/L   Chloride 105 98 - 111 mmol/L   CO2 24 22 - 32 mmol/L   Glucose, Bld 101 (H) 70 - 99 mg/dL   BUN 16 6 - 20 mg/dL   Creatinine, Ser 0.63 0.44 - 1.00 mg/dL   Calcium 9.2 8.9 - 10.3 mg/dL   GFR, Estimated >60 >60 mL/min   Anion gap 9 5 - 15  CBC  Result Value Ref Range   WBC 5.9 4.0 - 10.5 K/uL   RBC 4.02 3.87 - 5.11 MIL/uL   Hemoglobin 12.6 12.0 - 15.0 g/dL   HCT 39.2 36.0 - 46.0 %   MCV 97.5 80.0 - 100.0 fL   MCH 31.3 26.0 - 34.0 pg   MCHC 32.1 30.0 - 36.0 g/dL   RDW 13.4 11.5 - 15.5 %   Platelets 242 150 - 400 K/uL   nRBC 0.0 0.0 - 0.2 %  Troponin I (High Sensitivity)  Result Value Ref Range   Troponin I (High Sensitivity) <2 <18 ng/L   DG Chest 2 View  Result Date: 07/23/2022 CLINICAL DATA:  Chest pain EXAM: CHEST - 2 VIEW COMPARISON:  Radiograph 02/02/2020 FINDINGS: The heart size and mediastinal contours are within normal limits.No focal airspace disease. No  pleural effusion or pneumothorax.No acute osseous abnormality. IMPRESSION: No evidence of acute cardiopulmonary disease. Electronically Signed   By: Maurine Simmering M.D.   On: 07/23/2022 12:54  EKG EKG Interpretation  Date/Time:  Monday July 23 2022 12:29:17 EST Ventricular Rate:  77 PR Interval:  140 QRS Duration: 68 QT Interval:  366 QTC Calculation: 414 R Axis:   53 Text Interpretation: Normal sinus rhythm Normal ECG When compared with ECG of 02-Feb-2020 05:02, PREVIOUS ECG IS PRESENT Confirmed by Lajean Saver [phone]) on 07/23/2022 12:58:15 PM  Radiology DG Chest 2 View  Result Date: 07/23/2022 CLINICAL DATA:  Chest pain EXAM: CHEST - 2 VIEW COMPARISON:  Radiograph 02/02/2020 FINDINGS: The heart size and mediastinal contours are within normal limits.No focal airspace disease. No pleural effusion or pneumothorax.No acute osseous abnormality. IMPRESSION: No evidence of acute cardiopulmonary disease. Electronically Signed   By: Maurine Simmering M.D.   On: 07/23/2022 12:54    Procedures Procedures    Medications Ordered in ED Medications  alum & mag hydroxide-simeth (MAALOX/MYLANTA) 200-200-20 MG/5ML suspension 30 mL (30 mLs Oral Given 07/23/22 1512)  famotidine (PEPCID) tablet 20 mg (20 mg Oral Given 07/23/22 1512)  acetaminophen (TYLENOL) tablet 1,000 mg (1,000 mg Oral Given 07/23/22 1512)    ED Course/ Medical Decision Making/ A&P                             Medical Decision Making Problems Addressed: Precordial chest pain: acute illness or injury with systemic symptoms that poses a threat to life or bodily functions  Amount and/or Complexity of Data Reviewed External Data Reviewed: notes. Labs: ordered. Decision-making details documented in ED Course. Radiology: ordered and independent interpretation performed. Decision-making details documented in ED Course. ECG/medicine tests: ordered and independent interpretation performed. Decision-making details documented in ED  Course.  Risk OTC drugs. Decision regarding hospitalization.   Iv ns. Continuous pulse ox and cardiac monitoring. Labs ordered/sent. Imaging ordered.   Differential diagnosis includes ACS, msk cp, gi cp, etc . Dispo decision including potential need for admission considered - will get labs and imaging and reassess.   Reviewed nursing notes and prior charts for additional history. External reports reviewed.    Cardiac monitor: sinus rhythm, rate 74.  Labs reviewed/interpreted by me - wbc and hgb normal. Trop normal - after symptoms, prolonged, in past 3 days, felt not c/w acs.   Xrays reviewed/interpreted by me - no pna.   Acetaminophen po. Pepcid and maalox po.   Pt currently appears stable for d/c.   Rec pcp/card f/u.  Return precautions provided.            Final Clinical Impression(s) / ED Diagnoses Final diagnoses:  Precordial chest pain    Rx / DC Orders ED Discharge Orders          Ordered    Ambulatory referral to Cardiology       Comments: If you have not heard from the Cardiology office within the next 72 hours please call [phone].   07/23/22 1454              Lajean Saver, MD 07/23/22 1552

## 2022-07-23 NOTE — Discharge Instructions (Addendum)
It was our pleasure to provide your ER care today - we hope that you feel better.  May take acetaminophen or ibuprofen as need. If gi symptoms, try pepcid and maalox as need.   For recent chest discomfort, follow up closely with cardiology as outpatient - a referral was made and they should be contacting you with an appointment.   Return to ER if worse, new symptoms, recurrent/persistent chest pain, increased trouble breathing, or other emergency concern.

## 2022-08-08 ENCOUNTER — Encounter: Payer: Self-pay | Admitting: Internal Medicine

## 2022-08-08 ENCOUNTER — Ambulatory Visit: Payer: PRIVATE HEALTH INSURANCE | Attending: Internal Medicine | Admitting: Internal Medicine

## 2022-08-08 ENCOUNTER — Ambulatory Visit (INDEPENDENT_AMBULATORY_CARE_PROVIDER_SITE_OTHER): Payer: PRIVATE HEALTH INSURANCE

## 2022-08-08 VITALS — BP 122/82 | HR 80 | Ht 61.0 in | Wt 186.0 lb

## 2022-08-08 DIAGNOSIS — R002 Palpitations: Secondary | ICD-10-CM

## 2022-08-08 NOTE — Progress Notes (Signed)
Cardiology Office Note:    Date:  08/08/2022   ID:  Beverly Townsend, DOB 09-14-1970, MRN [id number]  PCP:  Beverly, Betty Townsend, [facility] Providers Cardiologist:  None     Referring MD: Lajean Saver, MD   No chief complaint on file. Non cardiac CP  History of Present Illness:    Beverly Townsend is a 52 y.o. female with a hx of predm, referral from the ED for non cardiac CP  She notes folding boxes, she had L sided pain afterwards. She was seen 07/23/2022. She reports tightness in her chest associated with some palpitations. She is anxious of many medical conditions. Her blood pressure was well controlled. Her chest wall was TTP. EKG was normal. Troponin was negative  She denies DOE. She notes she gets tightness in the chest for a few seconds. Was presyncopal in the shower, no syncope.   No smoking  PreDM A1c 5.8%  Father had CHF.   Past Medical History:  Diagnosis Date   Atypical chest pain    secondary to GERD   Diabetes mellitus without complication (HCC)    GERD (gastroesophageal reflux disease)    Head ache    Right sided    Obesity     Past Surgical History:  Procedure Laterality Date   ABDOMINAL HYSTERECTOMY     with BSO   BREAST CYST ASPIRATION      Current Medications: Current Meds  Medication Sig   acetaminophen (TYLENOL) 500 MG tablet Take 1,000 mg by mouth every 6 (six) hours as needed for mild pain or fever.   fluticasone (FLONASE) 50 MCG/ACT nasal spray Place 1 spray into both nostrils 2 (two) times daily.   ibuprofen (ADVIL) 600 MG tablet Take 1 tablet (600 mg total) by mouth every 8 (eight) hours as needed.   methocarbamol (ROBAXIN) 500 MG tablet Take 1 tablet (500 mg total) by mouth daily as needed for muscle spasms.   pravastatin (PRAVACHOL) 20 MG tablet TAKE 1 TABLET BY MOUTH EVERY DAY   SUMAtriptan (IMITREX) 50 MG tablet Take 1 tablet (50 mg total) by mouth daily as needed for migraine. May repeat in 2 hours if headache  persists or recurs.   valACYclovir (VALTREX) 1000 MG tablet TAKE 1 TABLET BY MOUTH DAILY FOR 5 DAYS.REPEAT TREATMENT FOR FUTURE LESIONS WITHIN 48 HOURS AND AS NEEDED     Allergies:   Patient has no known allergies.   Social History   Socioeconomic History   Marital status: Married    Spouse name: Not on file   Number of children: 1   Years of education: Not on file   Highest education level: Bachelor's degree (e.Townsend., BA, AB, BS)  Occupational History   Occupation: CNA  Tobacco Use   Smoking status: Never   Smokeless tobacco: Never  Vaping Use   Vaping Use: Never used  Substance and Sexual Activity   Alcohol use: No    Alcohol/week: 0.0 standard drinks of alcohol   Drug use: No   Sexual activity: Yes    Birth control/protection: Surgical  Other Topics Concern   Not on file  Social History Narrative   She is a CNA at [employer]    Married for 27 years    51 year old boy    She likes to cook and entertain.    Social Determinants of Health   Financial Resource Strain: Patient Declined (08/23/2021)   Overall Financial Resource Strain (CARDIA)    Difficulty  of Paying Living Expenses: Patient declined  Food Insecurity: No Food Insecurity (09/22/2021)   Hunger Vital Sign    Worried About Running Out of Food in the Last Year: Never true    Ran Out of Food in the Last Year: Never true  Transportation Needs: No Transportation Needs (09/22/2021)   PRAPARE - Hydrologist (Medical): No    Lack of Transportation (Non-Medical): No  Physical Activity: Insufficiently Active (08/23/2021)   Exercise Vital Sign    Days of Exercise per Week: 3 days    Minutes of Exercise per Session: 40 min  Stress: No Stress Concern Present (08/23/2021)   [place]    Feeling of Stress : Not at all  Social Connections: Unknown (08/23/2021)   Social Connection and Isolation Panel [NHANES]    Frequency of Communication  with Friends and Family: Patient declined    Frequency of Social Gatherings with Friends and Family: Patient declined    Attends Religious Services: Patient declined    Marine scientist or Organizations: Patient declined    Attends Music therapist: Not on file    Marital Status: Patient declined     Family History: The patient's family history includes Breast cancer in her maternal aunt and mother; Cancer in her father and mother; Colon cancer in her father; Diabetes in her paternal grandmother and another family member; Hypertension in her mother; Stomach cancer in her maternal grandfather; Stroke in her paternal grandmother. There is no history of Colon polyps, Esophageal cancer, or Rectal cancer.  ROS:   Please see the history of present illness.     All other systems reviewed and are negative.  EKGs/Labs/Other Studies Reviewed:    The following studies were reviewed today:   EKG:  EKG is  ordered today.  The ekg ordered today demonstrates  Per above  Recent Labs: 08/23/2021: ALT 14; TSH 1.42 07/23/2022: BUN 16; Creatinine, Ser 0.63; Hemoglobin 12.6; Platelets 242; Potassium 4.7; Sodium 138  Recent Lipid Panel    Component Value Date/Time   CHOL 168 08/23/2021 1434   TRIG 33.0 08/23/2021 1434   HDL 61.30 08/23/2021 1434   CHOLHDL 3 08/23/2021 1434   VLDL 6.6 08/23/2021 1434   LDLCALC 100 (H) 08/23/2021 1434     Risk Assessment/Calculations:     Physical Exam:    VS:  BP 122/82   Pulse 80   Ht 5\' 1"  (1.549 m)   Wt 186 lb (84.4 kg)   SpO2 97%   BMI 35.14 kg/m     Wt Readings from Last 3 Encounters:  08/08/22 186 lb (84.4 kg)  07/23/22 180 lb (81.6 kg)  04/20/22 188 lb 2 oz (85.3 kg)     GEN:  Well nourished, well developed in no acute distress HEENT: Normal NECK: No JVD; No carotid bruits LYMPHATICS: No lymphadenopathy CARDIAC: RRR, no murmurs, rubs, gallops RESPIRATORY:  Clear to auscultation without rales, wheezing or rhonchi  ABDOMEN:  Soft, non-tender, non-distended MUSCULOSKELETAL:  No edema; No deformity  SKIN: Warm and dry NEUROLOGIC:  Alert and oriented x 3 PSYCHIATRIC:  Normal affect   ASSESSMENT:    Palpations: ziopatch. No signs of arrhythmia so far.  MSK pain: No signs of CAD, pretest probability is low. No signs of HCM. Chest pain was reproducible in the ED. Recommend tyelnol/ibuprofen PLAN:    In order of problems listed above:  3 day ziopatch Follow up pending results  Medication Adjustments/Labs and Tests Ordered: Current medicines are reviewed at length with the patient today.  Concerns regarding medicines are outlined above.  No orders of the defined types were placed in this encounter.  No orders of the defined types were placed in this encounter.   There are no Patient Instructions on file for this visit.   Signed, Janina Mayo, MD  08/08/2022 10:56 AM    [place]

## 2022-08-08 NOTE — Progress Notes (Unsigned)
Enrolled patient for a 3 day Zio XT monitor to be mailed to patients home  

## 2022-08-08 NOTE — Patient Instructions (Addendum)
Medication Instructions:  Your physician recommends that you continue on your current medications as directed. Please refer to the Current Medication list given to you today.  *If you need a refill on your cardiac medications before your next appointment, please call your pharmacy*   Lab Work: NONE If you have labs (blood work) drawn today and your tests are completely normal, you will receive your results only by: [place] (if you have MyChart) OR A paper copy in the mail If you have any lab test that is abnormal or we need to change your treatment, we will call you to review the results.   Testing/Procedures: [name]- Long Term Monitor Instructions  Your physician has requested you wear a ZIO patch monitor for 3 days.  This is a single patch monitor. [employer] supplies one patch monitor per enrollment. Additional stickers are not available. Please do not apply patch if you will be having a Nuclear Stress Test,  Echocardiogram, Cardiac CT, MRI, or Chest Xray during the period you would be wearing the  monitor. The patch cannot be worn during these tests. You cannot remove and re-apply the  ZIO XT patch monitor.  Your ZIO patch monitor will be mailed 3 day USPS to your address on file. It may take 3-5 days  to receive your monitor after you have been enrolled.  Once you have received your monitor, please review the enclosed instructions. Your monitor  has already been registered assigning a specific monitor serial # to you.  Billing and Patient Assistance Program Information  We have supplied [employer] with any of your insurance information on file for billing purposes. [employer] offers a sliding scale Patient Assistance Program for patients that do not have  insurance, or whose insurance does not completely cover the cost of the ZIO monitor.  You must apply for the Patient Assistance Program to qualify for this discounted rate.  To apply, please call [employer] at [phone], select  option 4, select option 2, ask to apply for  Patient Assistance Program. [name] will ask your household income, and how many people  are in your household. They will quote your out-of-pocket cost based on that information.  [employer] will also be able to set up a 61-month interest-free payment plan if needed.  Applying the monitor   Shave hair from upper left chest.  Hold abrader disc by orange tab. Rub abrader in 40 strokes over the upper left chest as  indicated in your monitor instructions.  Clean area with 4 enclosed alcohol pads. Let dry.  Apply patch as indicated in monitor instructions. Patch will be placed under collarbone on left  side of chest with arrow pointing upward.  Rub patch adhesive wings for 2 minutes. Remove white label marked "1". Remove the white  label marked "2". Rub patch adhesive wings for 2 additional minutes.  While looking in a mirror, press and release button in center of patch. A small green light will  flash 3-4 times. This will be your only indicator that the monitor has been turned on.  Do not shower for the first 24 hours. You may shower after the first 24 hours.  Press the button if you feel a symptom. You will hear a small click. Record Date, Time and  Symptom in the Patient Logbook.  When you are ready to remove the patch, follow instructions on the last 2 pages of Patient  Logbook. Stick patch monitor onto the last page of Patient Logbook.  Place Patient Logbook in  the blue and white box. Use locking tab on box and tape box closed  securely. The blue and white box has prepaid postage on it. Please place it in the mailbox as  soon as possible. Your physician should have your test results approximately 7 days after the  monitor has been mailed back to [facility].  Call [place] at [phone] if you have questions regarding  your ZIO XT patch monitor. Call them immediately if you see an orange light blinking on your  monitor.   If your monitor falls off in less than 4 days, contact our Monitor department at [phone].  If your monitor becomes loose or falls off after 4 days call [employer] at [phone] for  suggestions on securing your monitor    Follow-Up: At [facility], you and your health needs are our priority.  As part of our continuing mission to provide you with exceptional heart care, we have created designated Provider Care Teams.  These Care Teams include your primary Cardiologist (physician) and Advanced Practice Providers (APPs -  Physician Assistants and Nurse Practitioners) who all work together to provide you with the care you need, when you need it.  We recommend signing up for the patient portal called "MyChart".  Sign up information is provided on this After Visit Summary.  MyChart is used to connect with patients for Virtual Visits (Telemedicine).  Patients are able to view lab/test results, encounter notes, upcoming appointments, etc.  Non-urgent messages can be sent to your provider as well.   To learn more about what you can do with MyChart, go to [email or web address].    Your next appointment:   As Needed  Provider:   Janina Mayo, MD

## 2022-08-10 DIAGNOSIS — R002 Palpitations: Secondary | ICD-10-CM | POA: Diagnosis not present

## 2022-08-27 ENCOUNTER — Other Ambulatory Visit (HOSPITAL_COMMUNITY): Payer: Self-pay | Admitting: Family Medicine

## 2022-08-27 DIAGNOSIS — Z1231 Encounter for screening mammogram for malignant neoplasm of breast: Secondary | ICD-10-CM

## 2022-08-30 ENCOUNTER — Ambulatory Visit: Payer: Self-pay | Admitting: Internal Medicine

## 2022-09-19 ENCOUNTER — Encounter: Payer: Self-pay | Admitting: Gastroenterology

## 2022-09-24 ENCOUNTER — Encounter (HOSPITAL_COMMUNITY): Payer: Self-pay

## 2022-09-24 ENCOUNTER — Ambulatory Visit (HOSPITAL_COMMUNITY): Payer: PRIVATE HEALTH INSURANCE

## 2022-09-24 ENCOUNTER — Ambulatory Visit (HOSPITAL_COMMUNITY)
Admission: RE | Admit: 2022-09-24 | Discharge: 2022-09-24 | Disposition: A | Discharge status: Home / Self Care | Payer: PRIVATE HEALTH INSURANCE | Source: Ambulatory Visit | Attending: Family Medicine | Admitting: Family Medicine

## 2022-09-24 DIAGNOSIS — Z1231 Encounter for screening mammogram for malignant neoplasm of breast: Secondary | ICD-10-CM | POA: Diagnosis not present

## 2022-09-27 ENCOUNTER — Telehealth: Payer: Self-pay | Admitting: Family Medicine

## 2022-09-27 ENCOUNTER — Encounter: Payer: Self-pay | Admitting: Gastroenterology

## 2022-09-27 NOTE — Telephone Encounter (Signed)
Called pt to r/s appt due to provider PAL. No answer. Left msg for pt to call back to r/s.

## 2022-10-01 ENCOUNTER — Encounter: Payer: Self-pay | Admitting: Gastroenterology

## 2022-10-10 ENCOUNTER — Ambulatory Visit: Payer: Self-pay | Admitting: Family Medicine

## 2022-10-10 NOTE — Progress Notes (Deleted)
HPI: Beverly Townsend is a 52 y.o. female, who is here today for chronic disease management.  Last seen on 04/20/22  *** Review of Systems See other pertinent positives and negatives in HPI.  Current Outpatient Medications on File Prior to Visit  Medication Sig Dispense Refill   acetaminophen (TYLENOL) 500 MG tablet Take 1,000 mg by mouth every 6 (six) hours as needed for mild pain or fever.     fluticasone (FLONASE) 50 MCG/ACT nasal spray Place 1 spray into both nostrils 2 (two) times daily. 16 g 3   ibuprofen (ADVIL) 600 MG tablet Take 1 tablet (600 mg total) by mouth every 8 (eight) hours as needed. 21 tablet 0   methocarbamol (ROBAXIN) 500 MG tablet Take 1 tablet (500 mg total) by mouth daily as needed for muscle spasms. 30 tablet 1   pravastatin (PRAVACHOL) 20 MG tablet TAKE 1 TABLET BY MOUTH EVERY DAY 30 tablet 6   SUMAtriptan (IMITREX) 50 MG tablet Take 1 tablet (50 mg total) by mouth daily as needed for migraine. May repeat in 2 hours if headache persists or recurs. 10 tablet 0   valACYclovir (VALTREX) 1000 MG tablet TAKE 1 TABLET BY MOUTH DAILY FOR 5 DAYS.REPEAT TREATMENT FOR FUTURE LESIONS WITHIN 48 HOURS AND AS NEEDED 20 tablet 2   No current facility-administered medications on file prior to visit.    Past Medical History:  Diagnosis Date   Atypical chest pain    secondary to GERD   Diabetes mellitus without complication (HCC)    GERD (gastroesophageal reflux disease)    Head ache    Right sided    Obesity    No Known Allergies  Social History   Socioeconomic History   Marital status: Married    Spouse name: Not on file   Number of children: 1   Years of education: Not on file   Highest education level: Bachelor's degree (e.g., BA, AB, BS)  Occupational History   Occupation: CNA  Tobacco Use   Smoking status: Never   Smokeless tobacco: Never  Vaping Use   Vaping Use: Never used  Substance and Sexual Activity   Alcohol use: No    Alcohol/week:  0.0 standard drinks of alcohol   Drug use: No   Sexual activity: Yes    Birth control/protection: Surgical  Other Topics Concern   Not on file  Social History Narrative   She is a CNA at [employer]    Married for 16 years    50 year old boy    She likes to cook and entertain.    Social Determinants of Health   Financial Resource Strain: Patient Declined (08/23/2021)   Overall Financial Resource Strain (CARDIA)    Difficulty of Paying Living Expenses: Patient declined  Food Insecurity: No Food Insecurity (09/22/2021)   Hunger Vital Sign    Worried About Running Out of Food in the Last Year: Never true    Ran Out of Food in the Last Year: Never true  Transportation Needs: No Transportation Needs (09/22/2021)   PRAPARE - Administrator, Civil Service (Medical): No    Lack of Transportation (Non-Medical): No  Physical Activity: Insufficiently Active (08/23/2021)   Exercise Vital Sign    Days of Exercise per Week: 3 days    Minutes of Exercise per Session: 40 min  Stress: No Stress Concern Present (08/23/2021)   [employer] of Occupational Health - Occupational Stress Questionnaire    Feeling of Stress :  Not at all  Social Connections: Unknown (08/23/2021)   Social Connection and Isolation Panel [NHANES]    Frequency of Communication with Friends and Family: Patient declined    Frequency of Social Gatherings with Friends and Family: Patient declined    Attends Religious Services: Patient declined    Database administrator or Organizations: Patient declined    Attends Banker Meetings: Not on file    Marital Status: Patient declined    There were no vitals filed for this visit. There is no height or weight on file to calculate BMI.  Physical Exam  ASSESSMENT AND PLAN:  There are no diagnoses linked to this encounter.  No orders of the defined types were placed in this encounter.   No problem-specific Assessment & Plan notes found for this  encounter.   No follow-ups on file.  Betty G. [place], MD  [facility]. [place] office.

## 2022-10-16 NOTE — Progress Notes (Signed)
HPI: Beverly Townsend is a 52 y.o. female, who is here today for chronic disease management.  Last seen on 04/20/22. Since her last visit she has been evaluated in the ED for chest pain and palpitations. She followed with cardiologist on 08/08/2022. She still has some concerns about left-sided lateral chest pain.  She is still experiencing chest pains on her left side, initially concerning for cardiac issues. However, after wearing a heart monitor for two days did not reveal serious arrhythmia.  She has not identified exacerbating or alleviating factors. Denies any exertional CP,dyspnea,palpitation,or diaphoresis. Symptoms do not interfere with sleep.  She also reports history of dysphagia, which led to an endoscopy in May 2020 revealing esophageal stenosis. She underwent esophageal dilation. Hx of GERD, currently she is not on PPI. She continues to experience intermittent difficulties swallowing, particularly with liquids and certain foods like french fries. She has not noted heartburn or acid reflux. + Bloating sensation.  DM II: Dx'ed in 2019. She is on non pharmacologic treatment. BS's 80-115. She is planning on seeing eye care provider this weekend. 02/02/20 HgA1C was 6.7. Negative for abdominal pain, nausea,numbness, polydipsia,polyuria, or feet ulcers/trauma. She walks a few times per week and tries to follow a healthful diet, frustrated because she has not noted wt loss.  Lab Results  Component Value Date   MICROALBUR <0.7 08/23/2021   MICROALBUR 0.9 07/15/2020   Hyperlipidemia: Currently she is on pravastatin 20 mg daily. Lab Results  Component Value Date   CHOL 168 08/23/2021   HDL 61.30 08/23/2021   LDLCALC 100 (H) 08/23/2021   TRIG 33.0 08/23/2021   CHOLHDL 3 08/23/2021   Review of Systems  Constitutional:  Negative for activity change, appetite change, chills and fever.  HENT:  Positive for postnasal drip and trouble swallowing. Negative for sore throat.    Respiratory:  Negative for cough and wheezing.   Cardiovascular:  Negative for leg swelling.  Gastrointestinal:  Negative for abdominal pain, nausea and vomiting.  Endocrine: Negative for cold intolerance and heat intolerance.  Genitourinary:  Negative for decreased urine volume, dysuria and hematuria.  Skin:  Negative for rash.  Neurological:  Negative for syncope, facial asymmetry, weakness and headaches.  See other pertinent positives and negatives in HPI.  Current Outpatient Medications on File Prior to Visit  Medication Sig Dispense Refill   acetaminophen (TYLENOL) 500 MG tablet Take 1,000 mg by mouth every 6 (six) hours as needed for mild pain or fever.     fluticasone (FLONASE) 50 MCG/ACT nasal spray Place 1 spray into both nostrils 2 (two) times daily. 16 g 3   ibuprofen (ADVIL) 600 MG tablet Take 1 tablet (600 mg total) by mouth every 8 (eight) hours as needed. 21 tablet 0   methocarbamol (ROBAXIN) 500 MG tablet Take 1 tablet (500 mg total) by mouth daily as needed for muscle spasms. 30 tablet 1   pravastatin (PRAVACHOL) 20 MG tablet TAKE 1 TABLET BY MOUTH EVERY DAY 30 tablet 6   SUMAtriptan (IMITREX) 50 MG tablet Take 1 tablet (50 mg total) by mouth daily as needed for migraine. May repeat in 2 hours if headache persists or recurs. 10 tablet 0   valACYclovir (VALTREX) 1000 MG tablet TAKE 1 TABLET BY MOUTH DAILY FOR 5 DAYS.REPEAT TREATMENT FOR FUTURE LESIONS WITHIN 48 HOURS AND AS NEEDED 20 tablet 2   No current facility-administered medications on file prior to visit.   Past Medical History:  Diagnosis Date   Atypical chest pain  secondary to GERD   Diabetes mellitus without complication (HCC)    GERD (gastroesophageal reflux disease)    Head ache    Right sided    Obesity    No Known Allergies  Social History   Socioeconomic History   Marital status: Married    Spouse name: Not on file   Number of children: 1   Years of education: Not on file   Highest education  level: Bachelor's degree (e.g., BA, AB, BS)  Occupational History   Occupation: CNA  Tobacco Use   Smoking status: Never   Smokeless tobacco: Never  Vaping Use   Vaping Use: Never used  Substance and Sexual Activity   Alcohol use: No    Alcohol/week: 0.0 standard drinks of alcohol   Drug use: No   Sexual activity: Yes    Birth control/protection: Surgical  Other Topics Concern   Not on file  Social History Narrative   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    Social Determinants of Health   Financial Resource Strain: Patient Declined (10/16/2022)   Overall Financial Resource Strain (CARDIA)    Difficulty of Paying Living Expenses: Patient declined  Food Insecurity: Patient Declined (10/16/2022)   Hunger Vital Sign    Worried About Running Out of Food in the Last Year: Patient declined    Ran Out of Food in the Last Year: Patient declined  Transportation Needs: Patient Declined (10/16/2022)   PRAPARE - Administrator, Civil Service (Medical): Patient declined    Lack of Transportation (Non-Medical): Patient declined  Physical Activity: Sufficiently Active (10/16/2022)   Exercise Vital Sign    Days of Exercise per Week: 4 days    Minutes of Exercise per Session: 40 min  Stress: No Stress Concern Present (10/16/2022)   [employer] of Occupational Health - Occupational Stress Questionnaire    Feeling of Stress : Not at all  Social Connections: Unknown (10/16/2022)   Social Connection and Isolation Panel [NHANES]    Frequency of Communication with Friends and Family: Patient declined    Frequency of Social Gatherings with Friends and Family: Patient declined    Attends Religious Services: Patient declined    Database administrator or Organizations: Patient declined    Attends Banker Meetings: Not on file    Marital Status: Patient declined   Today's Vitals   10/17/22 1203  BP:  118/70  Pulse: 88  Resp: 12  SpO2: 98%  Weight: 187 lb 2 oz (84.9 kg)  Height: 5\' 1"  (1.549 m)   Wt Readings from Last 3 Encounters:  10/17/22 187 lb 2 oz (84.9 kg)  08/08/22 186 lb (84.4 kg)  07/23/22 180 lb (81.6 kg)   Body mass index is 35.36 kg/m.  Physical Exam Vitals and nursing note reviewed.  Constitutional:      General: She is not in acute distress.    Appearance: She is well-developed.  HENT:     Head: Normocephalic and atraumatic.     Mouth/Throat:     Mouth: Mucous membranes are moist.     Pharynx: Oropharynx is clear.  Eyes:     Conjunctiva/sclera: Conjunctivae normal.  Cardiovascular:     Rate and Rhythm: Normal rate and regular rhythm.     Pulses:          Dorsalis pedis pulses are 2+  on the right side and 2+ on the left side.     Heart sounds: No murmur heard. Pulmonary:     Effort: Pulmonary effort is normal. No respiratory distress.     Breath sounds: Normal breath sounds.  Chest:     Chest wall: No tenderness.  Abdominal:     Palpations: Abdomen is soft. There is no hepatomegaly or mass.     Tenderness: There is no abdominal tenderness.  Lymphadenopathy:     Cervical: No cervical adenopathy.  Skin:    General: Skin is warm.     Findings: No erythema or rash.  Neurological:     General: No focal deficit present.     Mental Status: She is alert and oriented to person, place, and time.     Cranial Nerves: No cranial nerve deficit.     Gait: Gait normal.  Psychiatric:        Mood and Affect: Affect normal. Mood is anxious.   ASSESSMENT AND PLAN:  Beverly Townsend was seen today for medical management of chronic issues.  Diagnoses and all orders for this visit: Lab Results  Component Value Date   HGBA1C 5.8 10/17/2022   Lab Results  Component Value Date   CHOL 169 10/17/2022   HDL 61.70 10/17/2022   LDLCALC 97 10/17/2022   TRIG 54.0 10/17/2022   CHOLHDL 3 10/17/2022   Type 2 diabetes mellitus with other specified complication, without  long-term current use of insulin (HCC) Assessment & Plan: Co morbilities: HLD,obesity,GERD. Problem is adequately controlled, hemoglobin A1c today stable at 5.8. Continue non pharmacologic treatment. Annual eye exam, periodic dental and foot care recommended. F/U in 5-6 months.  Orders: -     POCT glycosylated hemoglobin (Hb A1C)  Hyperlipidemia associated with type 2 diabetes mellitus (HCC) Assessment & Plan: Continue pravastatin 20 mg daily and low-fat diet. Further recommendation will be given according to lab results.  Orders: -     Lipid panel; Future  Class 2 severe obesity with serious comorbidity and body mass index (BMI) of 36.0 to 36.9 in adult, unspecified obesity type [facility]) Assessment & Plan: Weight is otherwise stable. She will understands the benefits of wt loss as well as adverse effects of obesity. Consistency with healthy diet and physical activity encouraged. She is interested in establishing with weight loss clinic, referral placed.  Orders: -     Amb Ref to Medical Weight Management  Dysphagia, unspecified type Assessment & Plan: This seems to be a recurrent problem. She has been evaluated by gastroenterologist and underwent EGD. Omeprazole 40 mg started today. If problem does not improve, gastroenterologist evaluation will be recommended.   Gastroesophageal reflux disease, unspecified whether esophagitis present Assessment & Plan: This problem could be contributing to her chest discomfort as well as dysphagia. Recommend omeprazole 40 mg daily, 30-minute before breakfast, for 8 weeks, then we can decrease dose to 20 mg daily. GERD precautions also recommended.  Orders: -     Omeprazole; Take 1 capsule (40 mg total) by mouth daily.  Dispense: 90 capsule; Refill: 0  Heart palpitations Assessment & Plan: Problem has improved. Heart monitor report reviewed, no significant heart rhythm abnormalities. Instructed about warning signs.   Return in about  6 months (around 04/19/2023) for chronic problems.  Lelia Jons G. [place], MD  [facility]. [place] office.

## 2022-10-17 ENCOUNTER — Ambulatory Visit (INDEPENDENT_AMBULATORY_CARE_PROVIDER_SITE_OTHER): Payer: PRIVATE HEALTH INSURANCE | Admitting: Family Medicine

## 2022-10-17 ENCOUNTER — Encounter: Payer: Self-pay | Admitting: Family Medicine

## 2022-10-17 VITALS — BP 118/70 | HR 88 | Resp 12 | Ht 61.0 in | Wt 187.1 lb

## 2022-10-17 DIAGNOSIS — E1169 Type 2 diabetes mellitus with other specified complication: Secondary | ICD-10-CM | POA: Diagnosis not present

## 2022-10-17 DIAGNOSIS — E785 Hyperlipidemia, unspecified: Secondary | ICD-10-CM | POA: Diagnosis not present

## 2022-10-17 DIAGNOSIS — K219 Gastro-esophageal reflux disease without esophagitis: Secondary | ICD-10-CM

## 2022-10-17 DIAGNOSIS — R1310 Dysphagia, unspecified: Secondary | ICD-10-CM | POA: Diagnosis not present

## 2022-10-17 DIAGNOSIS — E119 Type 2 diabetes mellitus without complications: Secondary | ICD-10-CM

## 2022-10-17 DIAGNOSIS — R002 Palpitations: Secondary | ICD-10-CM

## 2022-10-17 DIAGNOSIS — Z6836 Body mass index (BMI) 36.0-36.9, adult: Secondary | ICD-10-CM

## 2022-10-17 LAB — POCT GLYCOSYLATED HEMOGLOBIN (HGB A1C): HbA1c, POC (prediabetic range): 5.8 % (ref 5.7–6.4)

## 2022-10-17 MED ORDER — OMEPRAZOLE 40 MG PO CPDR: 40.0000 mg | DELAYED_RELEASE_CAPSULE | Freq: Every day | ORAL | 0 refills | Status: DC

## 2022-10-17 NOTE — Assessment & Plan Note (Signed)
This problem could be contributing to her chest discomfort as well as dysphagia. Recommend omeprazole 40 mg daily, 30-minute before breakfast, for 8 weeks, then we can decrease dose to 20 mg daily. GERD precautions also recommended.

## 2022-10-17 NOTE — Patient Instructions (Addendum)
A few things to remember from today's visit:  Hyperlipidemia associated with type 2 diabetes mellitus (HCC) - Plan: Lipid panel  Controlled type 2 diabetes mellitus without complication, without long-term current use of insulin (HCC) - Plan: POC HgB A1c  Class 2 severe obesity with serious comorbidity and body mass index (BMI) of 36.0 to 36.9 in adult, unspecified obesity type (HCC) - Plan: Amb Ref to Medical Weight Management  Dysphagia, unspecified type  Start Omeprazole 40 mg 30 min before breakfast for 8 weeks then 20 mg.  If you need refills for medications you take chronically, please call your pharmacy. Do not use My Chart to request refills or for acute issues that need immediate attention. If you send a my chart message, it may take a few days to be addressed, specially if I am not in the office.  Please be sure medication list is accurate. If a new problem present, please set up appointment sooner than planned today.

## 2022-10-17 NOTE — Assessment & Plan Note (Signed)
Weight is otherwise stable. She will understands the benefits of wt loss as well as adverse effects of obesity. Consistency with healthy diet and physical activity encouraged. She is interested in establishing with weight loss clinic, referral placed.

## 2022-10-17 NOTE — Assessment & Plan Note (Signed)
This seems to be a recurrent problem. She has been evaluated by gastroenterologist and underwent EGD. Omeprazole 40 mg started today. If problem does not improve, gastroenterologist evaluation will be recommended.

## 2022-10-17 NOTE — Assessment & Plan Note (Signed)
Continue pravastatin 20 mg daily and low-fat diet. Further recommendation will be given according to lab results.

## 2022-10-17 NOTE — Assessment & Plan Note (Addendum)
Co morbilities: HLD,obesity,GERD. Problem is adequately controlled, hemoglobin A1c today stable at 5.8. Continue non pharmacologic treatment. Annual eye exam, periodic dental and foot care recommended. F/U in 5-6 months.

## 2022-10-17 NOTE — Assessment & Plan Note (Addendum)
Problem has improved. Heart monitor report reviewed, no significant heart rhythm abnormalities. Instructed about warning signs.

## 2022-10-18 LAB — LIPID PANEL
Cholesterol: 169 mg/dL (ref 0–200)
HDL: 61.7 mg/dL (ref >39.00)
LDL Cholesterol: 97 mg/dL (ref 0–99)
NonHDL: 107.33
Total CHOL/HDL Ratio: 3
Triglycerides: 54 mg/dL (ref 0.0–149.0)
VLDL: 10.8 mg/dL (ref 0.0–40.0)

## 2022-10-19 ENCOUNTER — Ambulatory Visit: Payer: Self-pay | Admitting: Family Medicine

## 2022-10-21 MED ORDER — PRAVASTATIN SODIUM 20 MG PO TABS: 20.0000 mg | ORAL_TABLET | Freq: Every day | ORAL | 3 refills | Status: DC

## 2022-10-24 ENCOUNTER — Other Ambulatory Visit: Payer: Self-pay | Admitting: Family Medicine

## 2022-10-24 DIAGNOSIS — K219 Gastro-esophageal reflux disease without esophagitis: Secondary | ICD-10-CM

## 2022-11-08 ENCOUNTER — Ambulatory Visit (AMBULATORY_SURGERY_CENTER): Payer: Self-pay

## 2022-11-08 VITALS — Ht 61.0 in | Wt 187.0 lb

## 2022-11-08 DIAGNOSIS — Z800 Family history of malignant neoplasm of digestive organs: Secondary | ICD-10-CM

## 2022-11-08 MED ORDER — NA SULFATE-K SULFATE-MG SULF 17.5-3.13-1.6 GM/177ML PO SOLN
1.0000 | Freq: Once | ORAL | 0 refills | Status: AC
Start: 2022-11-08 — End: 2022-11-08

## 2022-11-08 NOTE — Progress Notes (Signed)
No egg or soy allergy known to patient  No issues known to pt with past sedation with any surgeries or procedures Patient denies ever being told they had issues or difficulty with intubation  No FH of Malignant Hyperthermia Pt is not on diet pills Pt is not on  home 02  Pt is not on blood thinners  Pt denies issues with constipation  No A fib or A flutter Have any cardiac testing pending--no  LOA: independent  Prep: suprep   Patient's chart reviewed by Cathlyn Parsons CNRA prior to previsit and patient appropriate for the [facility].  Previsit completed and red dot placed by patient's name on their procedure day (on provider's schedule).     PV competed with patient. Prep instructions sent via mychart and home address. Goodrx coupon for [facility] provided to use for price reduction if needed.

## 2022-11-09 ENCOUNTER — Other Ambulatory Visit: Payer: Self-pay | Admitting: Family Medicine

## 2022-11-09 DIAGNOSIS — K219 Gastro-esophageal reflux disease without esophagitis: Secondary | ICD-10-CM

## 2022-11-09 MED ORDER — OMEPRAZOLE 40 MG PO CPDR
40.0000 mg | DELAYED_RELEASE_CAPSULE | Freq: Every day | ORAL | 2 refills | Status: AC
Start: 2022-11-09 — End: ?

## 2022-11-26 ENCOUNTER — Telehealth: Payer: Self-pay | Admitting: Gastroenterology

## 2022-11-26 NOTE — Telephone Encounter (Signed)
Inbound call from patient requesting a call back to discuss insurance for tomorrow 7/9 procedure. States she was called and was told there was a problem with her insurance. Please advise, thank you.

## 2022-11-27 ENCOUNTER — Encounter: Payer: Self-pay | Admitting: Gastroenterology

## 2022-11-27 ENCOUNTER — Ambulatory Visit (AMBULATORY_SURGERY_CENTER): Payer: Self-pay | Admitting: Gastroenterology

## 2022-11-27 VITALS — BP 118/81 | HR 68 | Temp 98.1°F | Resp 11 | Ht 61.0 in | Wt 187.0 lb

## 2022-11-27 DIAGNOSIS — Z800 Family history of malignant neoplasm of digestive organs: Secondary | ICD-10-CM

## 2022-11-27 DIAGNOSIS — Z1211 Encounter for screening for malignant neoplasm of colon: Secondary | ICD-10-CM

## 2022-11-27 MED ORDER — SODIUM CHLORIDE 0.9 % IV SOLN
500.0000 mL | Freq: Once | INTRAVENOUS | Status: DC
Start: 2022-11-27 — End: 2022-11-27

## 2022-11-27 NOTE — Patient Instructions (Addendum)
     Handouts on diverticulosis & hemorrhoids given to you    YOU HAD AN ENDOSCOPIC PROCEDURE TODAY AT THE [facility]:   Refer to the procedure report that was given to you for any specific questions about what was found during the examination.  If the procedure report does not answer your questions, please call your gastroenterologist to clarify.  If you requested that your care partner not be given the details of your procedure findings, then the procedure report has been included in a sealed envelope for you to review at your convenience later.  YOU SHOULD EXPECT: Some feelings of bloating in the abdomen. Passage of more gas than usual.  Walking can help get rid of the air that was put into your GI tract during the procedure and reduce the bloating. If you had a lower endoscopy (such as a colonoscopy or flexible sigmoidoscopy) you may notice spotting of blood in your stool or on the toilet paper. If you underwent a bowel prep for your procedure, you may not have a normal bowel movement for a few days.  Please Note:  You might notice some irritation and congestion in your nose or some drainage.  This is from the oxygen used during your procedure.  There is no need for concern and it should clear up in a day or so.  SYMPTOMS TO REPORT IMMEDIATELY:  Following lower endoscopy (colonoscopy or flexible sigmoidoscopy):  Excessive amounts of blood in the stool  Significant tenderness or worsening of abdominal pains  Swelling of the abdomen that is new, acute  Fever of 100F or higher   For urgent or emergent issues, a gastroenterologist can be reached at any hour by calling [phone]. Do not use MyChart messaging for urgent concerns.    DIET:  We do recommend a small meal at first, but then you may proceed to your regular diet.  Drink plenty of fluids but you should avoid alcoholic beverages for 24 hours.  ACTIVITY:  You should plan to take it easy for the rest of today and  you should NOT DRIVE or use heavy machinery until tomorrow (because of the sedation medicines used during the test).    FOLLOW UP: Our staff will call the number listed on your records the next business day following your procedure.  We will call around 7:15- 8:00 am to check on you and address any questions or concerns that you may have regarding the information given to you following your procedure. If we do not reach you, we will leave a message.     If any biopsies were taken you will be contacted by phone or by letter within the next 1-3 weeks.  Please call us at [phone] if you have not heard about the biopsies in 3 weeks.    SIGNATURES/CONFIDENTIALITY: You and/or your care partner have signed paperwork which will be entered into your electronic medical record.  These signatures attest to the fact that that the information above on your After Visit Summary has been reviewed and is understood.  Full responsibility of the confidentiality of this discharge information lies with you and/or your care-partner.

## 2022-11-27 NOTE — Progress Notes (Signed)
To pacu, VSS. Report to [email or web address] 

## 2022-11-27 NOTE — Op Note (Signed)
[facility] Patient Name: Beverly Townsend Procedure Date: 11/27/2022 9:31 AM MRN: [id number] Endoscopist: Napoleon Form , MD, [id number] Age: 52 Referring MD:  Date of Birth: 08-Feb-1971 Gender: Female Account #: [id number] Procedure:                Colonoscopy Indications:              Screening in patient at increased risk: Colorectal                            cancer in father before age 76 Medicines:                Monitored Anesthesia Care Procedure:                Pre-Anesthesia Assessment:                           - Prior to the procedure, a History and Physical                            was performed, and patient medications and                            allergies were reviewed. The patient's tolerance of                            previous anesthesia was also reviewed. The risks                            and benefits of the procedure and the sedation                            options and risks were discussed with the patient.                            All questions were answered, and informed consent                            was obtained. Prior Anticoagulants: The patient has                            taken no anticoagulant or antiplatelet agents. ASA                            Grade Assessment: II - A patient with mild systemic                            disease. After reviewing the risks and benefits,                            the patient was deemed in satisfactory condition to                            undergo the procedure.  After obtaining informed consent, the colonoscope                            was passed under direct vision. Throughout the                            procedure, the patient's blood pressure, pulse, and                            oxygen saturations were monitored continuously. The                            [id number]-HQ190L Colonoscope [id number] was introduced                            through the anus and  advanced to the the cecum,                            identified by appendiceal orifice and ileocecal                            valve. The colonoscopy was performed without                            difficulty. The patient tolerated the procedure                            well. The quality of the bowel preparation was                            good. The ileocecal valve, appendiceal orifice, and                            rectum were photographed. Scope In: 9:49:17 AM Scope Out: 10:01:14 AM Scope Withdrawal Time: 0 hours 7 minutes 52 seconds  Total Procedure Duration: 0 hours 11 minutes 57 seconds  Findings:                 The perianal and digital rectal examinations were                            normal.                           A few small-mouthed diverticula were found in the                            sigmoid colon and descending colon.                           Non-bleeding external and internal hemorrhoids were                            found during retroflexion. The hemorrhoids were  small. Complications:            No immediate complications. Estimated Blood Loss:     Estimated blood loss was minimal. Impression:               - Diverticulosis in the sigmoid colon and in the                            descending colon.                           - Non-bleeding external and internal hemorrhoids.                           - No specimens collected. Recommendation:           - Patient has a contact number available for                            emergencies. The signs and symptoms of potential                            delayed complications were discussed with the                            patient. Return to normal activities tomorrow.                            Written discharge instructions were provided to the                            patient.                           - Resume previous diet.                           - Continue present  medications.                           - Repeat colonoscopy in 5 years for surveillance. Napoleon Form, MD 11/27/2022 10:16:07 AM This report has been signed electronically.

## 2022-11-27 NOTE — Progress Notes (Signed)
Pt's states no medical or surgical changes since previsit or office visit. 

## 2022-11-27 NOTE — Progress Notes (Signed)
[facility] History and Physical   Primary Care Physician:  [place], Betty G, MD   Reason for Procedure:  Family h/o colon cancer  Plan:    Surveillance colonoscopy with possible interventions as needed     HPI: Beverly Townsend is a very pleasant 52 y.o. female here for surveillance colonoscopy. Denies any nausea, vomiting, abdominal pain, melena or bright red blood per rectum  The risks and benefits as well as alternatives of endoscopic procedure(s) have been discussed and reviewed. All questions answered. The patient agrees to proceed.    Past Medical History:  Diagnosis Date   Atypical chest pain    secondary to GERD   Chronic kidney disease    Diabetes mellitus without complication (HCC)    GERD (gastroesophageal reflux disease)    Head ache    Right sided    Obesity     Past Surgical History:  Procedure Laterality Date   ABDOMINAL HYSTERECTOMY     with BSO   BREAST CYST ASPIRATION     COLONOSCOPY  2016    Prior to Admission medications   Medication Sig Start Date End Date Taking? Authorizing Provider  omeprazole (PRILOSEC) 40 MG capsule Take 1 capsule (40 mg total) by mouth daily. 11/09/22  Yes [place], Betty G, MD  pravastatin (PRAVACHOL) 20 MG tablet Take 1 tablet (20 mg total) by mouth daily. 10/21/22  Yes [place], Betty G, MD  acetaminophen (TYLENOL) 500 MG tablet Take 1,000 mg by mouth every 6 (six) hours as needed for mild pain or fever.    [provider]  fluticasone (FLONASE) 50 MCG/ACT nasal spray Place 1 spray into both nostrils 2 (two) times daily. 11/30/20   [place], Betty G, MD  ibuprofen (ADVIL) 600 MG tablet Take 1 tablet (600 mg total) by mouth every 8 (eight) hours as needed. 02/13/22   Burgess Amor, PA-C  methocarbamol (ROBAXIN) 500 MG tablet Take 1 tablet (500 mg total) by mouth daily as needed for muscle spasms. Patient not taking: Reported on 11/27/2022 10/17/21   [place], Betty G, MD  SUMAtriptan (IMITREX) 50 MG tablet Take 1  tablet (50 mg total) by mouth daily as needed for migraine. May repeat in 2 hours if headache persists or recurs. Patient not taking: Reported on 11/08/2022 07/15/20   [place], Betty G, MD  valACYclovir (VALTREX) 1000 MG tablet TAKE 1 TABLET BY MOUTH DAILY FOR 5 DAYS.REPEAT TREATMENT FOR FUTURE LESIONS WITHIN 48 HOURS AND AS NEEDED 05/16/22   [place], Betty G, MD    Current Outpatient Medications  Medication Sig Dispense Refill   omeprazole (PRILOSEC) 40 MG capsule Take 1 capsule (40 mg total) by mouth daily. 90 capsule 2   pravastatin (PRAVACHOL) 20 MG tablet Take 1 tablet (20 mg total) by mouth daily. 90 tablet 3   acetaminophen (TYLENOL) 500 MG tablet Take 1,000 mg by mouth every 6 (six) hours as needed for mild pain or fever.     fluticasone (FLONASE) 50 MCG/ACT nasal spray Place 1 spray into both nostrils 2 (two) times daily. 16 g 3   ibuprofen (ADVIL) 600 MG tablet Take 1 tablet (600 mg total) by mouth every 8 (eight) hours as needed. 21 tablet 0   methocarbamol (ROBAXIN) 500 MG tablet Take 1 tablet (500 mg total) by mouth daily as needed for muscle spasms. (Patient not taking: Reported on 11/27/2022) 30 tablet 1   SUMAtriptan (IMITREX) 50 MG tablet Take 1 tablet (50 mg total) by mouth daily as needed for migraine. May repeat  in 2 hours if headache persists or recurs. (Patient not taking: Reported on 11/08/2022) 10 tablet 0   valACYclovir (VALTREX) 1000 MG tablet TAKE 1 TABLET BY MOUTH DAILY FOR 5 DAYS.REPEAT TREATMENT FOR FUTURE LESIONS WITHIN 48 HOURS AND AS NEEDED 20 tablet 2   Current Facility-Administered Medications  Medication Dose Route Frequency Provider Last Rate Last Admin   0.9 %  sodium chloride infusion  500 mL Intravenous Once Napoleon Form, MD        Allergies as of 11/27/2022   (No Known Allergies)    Family History  Problem Relation Age of Onset   Cancer Mother        breast   Hypertension Mother    Breast cancer Mother    Cancer Father        colon in early  41's   Colon cancer Father        early 75's   Breast cancer Maternal Aunt    Stomach cancer Maternal Grandmother    Stroke Paternal Grandmother    Diabetes Paternal Grandmother    Diabetes Other    Colon polyps Neg Hx    Esophageal cancer Neg Hx    Rectal cancer Neg Hx     Social History   Socioeconomic History   Marital status: Married    Spouse name: Not on file   Number of children: 1   Years of education: Not on file   Highest education level: Bachelor's degree (e.g., BA, AB, BS)  Occupational History   Occupation: CNA  Tobacco Use   Smoking status: Never   Smokeless tobacco: Never  Vaping Use   Vaping Use: Never used  Substance and Sexual Activity   Alcohol use: No    Alcohol/week: 0.0 standard drinks of alcohol   Drug use: No   Sexual activity: Yes    Birth control/protection: Surgical  Other Topics Concern   Not on file  Social History Narrative   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    Social Determinants of Health   Financial Resource Strain: Patient Declined (10/16/2022)   Overall Financial Resource Strain (CARDIA)    Difficulty of Paying Living Expenses: Patient declined  Food Insecurity: Patient Declined (10/16/2022)   Hunger Vital Sign    Worried About Running Out of Food in the Last Year: Patient declined    Ran Out of Food in the Last Year: Patient declined  Transportation Needs: Patient Declined (10/16/2022)   PRAPARE - Administrator, Civil Service (Medical): Patient declined    Lack of Transportation (Non-Medical): Patient declined  Physical Activity: Sufficiently Active (10/16/2022)   Exercise Vital Sign    Days of Exercise per Week: 4 days    Minutes of Exercise per Session: 40 min  Stress: No Stress Concern Present (10/16/2022)   [employer] of Occupational Health - Occupational Stress Questionnaire    Feeling of Stress : Not at all  Social Connections:  Unknown (10/16/2022)   Social Connection and Isolation Panel [NHANES]    Frequency of Communication with Friends and Family: Patient declined    Frequency of Social Gatherings with Friends and Family: Patient declined    Attends Religious Services: Patient declined    Database administrator or Organizations: Patient declined    Attends Banker Meetings: Not on file    Marital Status: Patient  declined  Intimate Partner Violence: Not on file    Review of Systems:  All other review of systems negative except as mentioned in the HPI.  Physical Exam: Vital signs in last 24 hours: Blood Pressure 103/63   Pulse 72   Temperature 98.1 F (36.7 C) (Temporal)   Respiration (Abnormal) 8   Height 5\' 1"  (1.549 m)   Weight 187 lb (84.8 kg)   Oxygen Saturation 100%   Body Mass Index 35.33 kg/m  General:   Alert, NAD Lungs:  Clear .   Heart:  Regular rate and rhythm Abdomen:  Soft, nontender and nondistended. Neuro/Psych:  Alert and cooperative. Normal mood and affect. A and O x 3  Reviewed labs, radiology imaging, old records and pertinent past GI work up  Patient is appropriate for planned procedure(s) and anesthesia in an ambulatory setting   K. Scherry Ran , MD [id number]

## 2022-11-28 ENCOUNTER — Telehealth: Payer: Self-pay | Admitting: *Deleted

## 2022-11-28 NOTE — Telephone Encounter (Signed)
  Follow up Call-     11/27/2022    8:52 AM  Call back number  Post procedure Call Back phone  # [phone]  Permission to leave phone message Yes     Patient questions:  Do you have a fever, pain , or abdominal swelling? No. Pain Score  0 *  Have you tolerated food without any problems? Yes.    Have you been able to return to your normal activities? Yes.    Do you have any questions about your discharge instructions: Diet   No. Medications  No. Follow up visit  No.  Do you have questions or concerns about your Care? No.  Actions: * If pain score is 4 or above: No action needed, pain <4.

## 2023-01-02 ENCOUNTER — Encounter (INDEPENDENT_AMBULATORY_CARE_PROVIDER_SITE_OTHER): Payer: Self-pay | Admitting: Physician Assistant

## 2023-01-09 ENCOUNTER — Ambulatory Visit: Payer: PRIVATE HEALTH INSURANCE | Admitting: Family Medicine

## 2023-01-09 ENCOUNTER — Encounter: Payer: Self-pay | Admitting: Family Medicine

## 2023-01-09 VITALS — BP 108/76 | HR 76 | Temp 98.1°F | Ht 61.0 in | Wt 184.4 lb

## 2023-01-09 DIAGNOSIS — N3001 Acute cystitis with hematuria: Secondary | ICD-10-CM

## 2023-01-09 DIAGNOSIS — R300 Dysuria: Secondary | ICD-10-CM | POA: Diagnosis not present

## 2023-01-09 LAB — POC URINALSYSI DIPSTICK (AUTOMATED)
Bilirubin, UA: NEGATIVE
Glucose, UA: NEGATIVE
Ketones, UA: NEGATIVE
Nitrite, UA: NEGATIVE
Protein, UA: POSITIVE — AB
Spec Grav, UA: 1.025 (ref 1.010–1.025)
Urobilinogen, UA: 0.2 E.U./dL
pH, UA: 5.5 (ref 5.0–8.0)

## 2023-01-09 MED ORDER — NITROFURANTOIN MONOHYD MACRO 100 MG PO CAPS: 100.0000 mg | ORAL_CAPSULE | Freq: Two times a day (BID) | ORAL | 0 refills | Status: DC

## 2023-01-09 NOTE — Patient Instructions (Signed)
There is urine sent for culture.  If we need to change antibiotic based on the results we will contact you.

## 2023-01-09 NOTE — Progress Notes (Signed)
Established Patient Office Visit   Subjective  Patient ID: Beverly Townsend, female    DOB: April 11, 1971  Age: 52 y.o. MRN: [id number]  Chief Complaint  Patient presents with   Urinary Tract Infection    Started 4 days ago, lower abdominal pain, light blood after peeing,     Patient is a 52 year old female followed by Dr. [place] and seen for acute concern.  Patient endorses dysuria, a bloated feeling, urinary frequency, and decreased urine output x 3 days.  Patient states symptoms seem to be getting worse each day.  Patient also noticed a little spotting on tissue after urination.  Patient denies fever, chills, nausea, vomiting, constipation, back pain.  Patient started taking Azo cranberry pills yesterday.  Urinary Tract Infection     Past Medical History:  Diagnosis Date   Atypical chest pain    secondary to GERD   Chronic kidney disease    Diabetes mellitus without complication (HCC)    GERD (gastroesophageal reflux disease)    Head ache    Right sided    Obesity    Past Surgical History:  Procedure Laterality Date   ABDOMINAL HYSTERECTOMY     with BSO   BREAST CYST ASPIRATION     COLONOSCOPY  2016   Social History   Tobacco Use   Smoking status: Never   Smokeless tobacco: Never  Vaping Use   Vaping status: Never Used  Substance Use Topics   Alcohol use: No    Alcohol/week: 0.0 standard drinks of alcohol   Drug use: No   Family History  Problem Relation Age of Onset   Cancer Mother        breast   Hypertension Mother    Breast cancer Mother    Cancer Father        colon in early 30's   Colon cancer Father        early 63's   Breast cancer Maternal Aunt    Stomach cancer Maternal Grandmother    Stroke Paternal Grandmother    Diabetes Paternal Grandmother    Diabetes Other    Colon polyps Neg Hx    Esophageal cancer Neg Hx    Rectal cancer Neg Hx    No Known Allergies    ROS Negative unless stated above    Objective:     BP 108/76 (BP  Location: Left Arm, Patient Position: Sitting, Cuff Size: Large)   Pulse 76   Temp 98.1 F (36.7 C) (Oral)   Ht 5\' 1"  (1.549 m)   Wt 184 lb 6.4 oz (83.6 kg)   SpO2 99%   BMI 34.84 kg/m    Physical Exam Constitutional:      General: She is not in acute distress.    Appearance: Normal appearance.  HENT:     Head: Normocephalic and atraumatic.     Nose: Nose normal.     Mouth/Throat:     Mouth: Mucous membranes are moist.  Cardiovascular:     Rate and Rhythm: Normal rate and regular rhythm.     Heart sounds: Normal heart sounds. No murmur heard.    No gallop.  Pulmonary:     Effort: Pulmonary effort is normal. No respiratory distress.     Breath sounds: Normal breath sounds. No wheezing, rhonchi or rales.  Abdominal:     General: Bowel sounds are normal.     Palpations: Abdomen is soft.     Tenderness: There is no abdominal tenderness. There is no right CVA  tenderness, left CVA tenderness or guarding.  Skin:    General: Skin is warm and dry.  Neurological:     Mental Status: She is alert and oriented to person, place, and time.      Results for orders placed or performed in visit on 01/09/23  POCT Urinalysis Dipstick (Automated)  Result Value Ref Range   Color, UA yellow    Clarity, UA clear    Glucose, UA Negative Negative   Bilirubin, UA neg    Ketones, UA neg    Spec Grav, UA 1.025 1.010 - 1.025   Blood, UA 3+    pH, UA 5.5 5.0 - 8.0   Protein, UA Positive (A) Negative   Urobilinogen, UA 0.2 0.2 or 1.0 E.U./dL   Nitrite, UA neg    Leukocytes, UA Large (3+) (A) Negative      Assessment & Plan:  Acute cystitis with hematuria -     Nitrofurantoin Monohyd Macro; Take 1 capsule (100 mg total) by mouth 2 (two) times daily.  Dispense: 14 capsule; Refill: 0  Dysuria -     POCT Urinalysis Dipstick (Automated) -     Urine Culture  Patient with acute UTI symptoms.  POC UA with 3+ leuks, 3+ RBCs, protein, SG 1.025.  Will start Macrobid while awaiting Ucx.  Will  change ABX if needed based on culture results.  Patient to increase p.o. intake of water and fluids.  Given precautions.  Return if symptoms worsen or fail to improve.   Deeann Saint, MD

## 2023-01-11 LAB — URINE CULTURE
MICRO NUMBER:: 15361944
SPECIMEN QUALITY:: ADEQUATE

## 2023-01-18 ENCOUNTER — Ambulatory Visit: Payer: PRIVATE HEALTH INSURANCE | Admitting: Family Medicine

## 2023-04-12 ENCOUNTER — Ambulatory Visit: Payer: PRIVATE HEALTH INSURANCE | Admitting: Family Medicine

## 2023-04-15 ENCOUNTER — Encounter: Payer: Self-pay | Admitting: Family Medicine

## 2023-04-15 ENCOUNTER — Ambulatory Visit (INDEPENDENT_AMBULATORY_CARE_PROVIDER_SITE_OTHER): Payer: Self-pay | Admitting: Family Medicine

## 2023-04-15 VITALS — BP 118/70 | HR 62 | Temp 98.3°F | Resp 12 | Ht 61.0 in | Wt 185.0 lb

## 2023-04-15 DIAGNOSIS — M545 Low back pain, unspecified: Secondary | ICD-10-CM

## 2023-04-15 DIAGNOSIS — R519 Headache, unspecified: Secondary | ICD-10-CM

## 2023-04-15 DIAGNOSIS — E1169 Type 2 diabetes mellitus with other specified complication: Secondary | ICD-10-CM

## 2023-04-15 DIAGNOSIS — E785 Hyperlipidemia, unspecified: Secondary | ICD-10-CM

## 2023-04-15 DIAGNOSIS — G8929 Other chronic pain: Secondary | ICD-10-CM

## 2023-04-15 DIAGNOSIS — E66812 Obesity, class 2: Secondary | ICD-10-CM

## 2023-04-15 DIAGNOSIS — Z6836 Body mass index (BMI) 36.0-36.9, adult: Secondary | ICD-10-CM

## 2023-04-15 DIAGNOSIS — A6000 Herpesviral infection of urogenital system, unspecified: Secondary | ICD-10-CM

## 2023-04-15 DIAGNOSIS — H9201 Otalgia, right ear: Secondary | ICD-10-CM

## 2023-04-15 LAB — COMPREHENSIVE METABOLIC PANEL
ALT: 12 U/L (ref 0–35)
AST: 16 U/L (ref 0–37)
Albumin: 4.2 g/dL (ref 3.5–5.2)
Alkaline Phosphatase: 61 U/L (ref 39–117)
BUN: 10 mg/dL (ref 6–23)
CO2: 29 meq/L (ref 19–32)
Calcium: 9.5 mg/dL (ref 8.4–10.5)
Chloride: 102 meq/L (ref 96–112)
Creatinine, Ser: 0.7 mg/dL (ref 0.40–1.20)
GFR: 100.19 mL/min (ref >60.00)
Glucose, Bld: 95 mg/dL (ref 70–99)
Potassium: 4.1 meq/L (ref 3.5–5.1)
Sodium: 138 meq/L (ref 135–145)
Total Bilirubin: 0.4 mg/dL (ref 0.2–1.2)
Total Protein: 7.2 g/dL (ref 6.0–8.3)

## 2023-04-15 LAB — POCT GLYCOSYLATED HEMOGLOBIN (HGB A1C): HbA1c, POC (prediabetic range): 6.1 % (ref 5.7–6.4)

## 2023-04-15 LAB — LIPID PANEL
Cholesterol: 184 mg/dL (ref 0–200)
HDL: 61.1 mg/dL (ref >39.00)
LDL Cholesterol: 113 mg/dL — ABNORMAL HIGH (ref 0–99)
NonHDL: 122.83
Total CHOL/HDL Ratio: 3
Triglycerides: 50 mg/dL (ref 0.0–149.0)
VLDL: 10 mg/dL (ref 0.0–40.0)

## 2023-04-15 MED ORDER — VALACYCLOVIR HCL 1 G PO TABS
ORAL_TABLET | ORAL | 2 refills | Status: DC
Start: 2023-04-15 — End: 2024-02-18

## 2023-04-15 NOTE — Patient Instructions (Addendum)
A few things to remember from today's visit:  Chronic bilateral low back pain without sciatica  Type 2 diabetes mellitus with other specified complication, without long-term current use of insulin (HCC) - Plan: POC HgB A1c, Microalbumin / creatinine urine ratio  Right-sided headache  Hyperlipidemia associated with type 2 diabetes mellitus (HCC) - Plan: Comprehensive metabolic panel, Lipid panel  Earache on right  Back pain could be arthritis. Monitor for new symptoms associated with headache. Flonase nasal spray daily for 10-14 days. Continue nasal saline irrigations. Try to pop your ears genteelly a few times per day for right ear discomfort.  If you need refills for medications you take chronically, please call your pharmacy. Do not use My Chart to request refills or for acute issues that need immediate attention. If you send a my chart message, it may take a few days to be addressed, specially if I am not in the office.  Please be sure medication list is accurate. If a new problem present, please set up appointment sooner than planned today.

## 2023-04-15 NOTE — Progress Notes (Signed)
HPI: Beverly Townsend is a 52 y.o. female with a PMHx significant for GERD, HLD, DM II, chronic headache, and heart palpitations, who is here today for chronic disease management.  Last seen on 10/17/2022  Diabetes Mellitus II:  - Checking BG at home: She has been checking her BG regularly at home. She says it is usually 123 or less, with a high reading of 143.  - Not currently on pharmacologic treatment.  - Exercise: She says she has been exercising regularly.  - eye exam: UTD on routine vision care. Her last exam was in 10/2022.  - Negative for symptoms of hypoglycemia, hematuria, polyuria, polydipsia, numbness extremities, foot ulcers/trauma  Lab Results  Component Value Date   HGBA1C 5.8 10/17/2022   Lab Results  Component Value Date   MICROALBUR <0.7 08/23/2021   She expresses some frustration over struggling to be seen by the weight loss clinic because of a long waiting list.   Hyperlipidemia: Currently on pravastatin 20 mg daily.  Lab Results  Component Value Date   CHOL 169 10/17/2022   HDL 61.70 10/17/2022   LDLCALC 97 10/17/2022   TRIG 54.0 10/17/2022   CHOLHDL 3 10/17/2022   Concerns today:   Headaches:  Patient also complains of 2-3 headaches over the few months. She rates the pain as an 8-9/10, and says it often starts on the right side of her face, spreading around the entire right side of her head.  She says the headache will last for a few days.  She endorses some associated right ear ache and "fullness" sensation.  She denies nausea, vomiting, sensitivity to light, nasal congestion, rhinorrhea, epiphora, conjunctival erythema, hearing changes, neck pain, or recent respiratory infection.  She has been taking OTC medications for "sinus headaches", which haven't helped. She also took some tylenol in her first few episodes, which helped a little bit.   She saw cardiology on 3/20 for palpitations and expresses some concern about her cardiovascular health.   Denies chest pain, dyspnea, orthopnea,PND,focal weakness, or edema.  Low back pain:  Patient further complains of bilateral low back pain and stiffness in the morning when she wakes up for awhile. She rates the pain as a 5/10, and says it improves with movement.  Problem has been intermittently for some time. She denies radiation to LE's, numbness/tingling, saddle anesthesia, or bladder/bowel dysfunction. No hx of trauma.   Recurrent genital herpes: Requesting refills on Valtrex. She has had 2 flare ups in the past 4-6 months. Negative for vaginal discharge or bleeding.  Review of Systems  Constitutional:  Negative for activity change, appetite change and fever.  HENT:  Negative for nosebleeds, sore throat and trouble swallowing.   Eyes:  Negative for redness and visual disturbance.  Respiratory:  Negative for cough and wheezing.   Gastrointestinal:  Negative for abdominal pain, nausea and vomiting.       Negative for changes in bowel habits.  Genitourinary:  Negative for decreased urine volume, dysuria and hematuria.  Musculoskeletal:  Negative for gait problem.  Skin:  Negative for rash.  Neurological:  Negative for seizures, syncope, weakness, numbness and headaches.  Psychiatric/Behavioral:  Negative for confusion and hallucinations.   See other pertinent positives and negatives in HPI.  Current Outpatient Medications on File Prior to Visit  Medication Sig Dispense Refill   acetaminophen (TYLENOL) 500 MG tablet Take 1,000 mg by mouth every 6 (six) hours as needed for mild pain or fever.     fluticasone (FLONASE) 50 MCG/ACT  nasal spray Place 1 spray into both nostrils 2 (two) times daily. 16 g 3   ibuprofen (ADVIL) 600 MG tablet Take 1 tablet (600 mg total) by mouth every 8 (eight) hours as needed. 21 tablet 0   methocarbamol (ROBAXIN) 500 MG tablet Take 1 tablet (500 mg total) by mouth daily as needed for muscle spasms. 30 tablet 1   nitrofurantoin, macrocrystal-monohydrate,  (MACROBID) 100 MG capsule Take 1 capsule (100 mg total) by mouth 2 (two) times daily. 14 capsule 0   omeprazole (PRILOSEC) 40 MG capsule Take 1 capsule (40 mg total) by mouth daily. 90 capsule 2   pravastatin (PRAVACHOL) 20 MG tablet Take 1 tablet (20 mg total) by mouth daily. 90 tablet 3   SUMAtriptan (IMITREX) 50 MG tablet Take 1 tablet (50 mg total) by mouth daily as needed for migraine. May repeat in 2 hours if headache persists or recurs. 10 tablet 0   valACYclovir (VALTREX) 1000 MG tablet TAKE 1 TABLET BY MOUTH DAILY FOR 5 DAYS.REPEAT TREATMENT FOR FUTURE LESIONS WITHIN 48 HOURS AND AS NEEDED 20 tablet 2   No current facility-administered medications on file prior to visit.    Past Medical History:  Diagnosis Date   Atypical chest pain    secondary to GERD   Chronic kidney disease    Diabetes mellitus without complication (HCC)    GERD (gastroesophageal reflux disease)    Head ache    Right sided    Obesity    No Known Allergies  Social History   Socioeconomic History   Marital status: Married    Spouse name: Not on file   Number of children: 1   Years of education: Not on file   Highest education level: Bachelor's degree (e.g., BA, AB, BS)  Occupational History   Occupation: CNA  Tobacco Use   Smoking status: Never   Smokeless tobacco: Never  Vaping Use   Vaping status: Never Used  Substance and Sexual Activity   Alcohol use: No    Alcohol/week: 0.0 standard drinks of alcohol   Drug use: No   Sexual activity: Yes    Birth control/protection: Surgical  Other Topics Concern   Not on file  Social History Narrative   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    Social Determinants of Health   Financial Resource Strain: Patient Declined (04/11/2023)   Overall Financial Resource Strain (CARDIA)    Difficulty of Paying Living Expenses: Patient declined  Food Insecurity: Patient Declined  (04/11/2023)   Hunger Vital Sign    Worried About Running Out of Food in the Last Year: Patient declined    Ran Out of Food in the Last Year: Patient declined  Transportation Needs: Patient Declined (04/11/2023)   PRAPARE - Administrator, Civil Service (Medical): Patient declined    Lack of Transportation (Non-Medical): Patient declined  Physical Activity: Unknown (04/11/2023)   Exercise Vital Sign    Days of Exercise per Week: Patient declined    Minutes of Exercise per Session: 40 min  Stress: Patient Declined (04/11/2023)   [employer] of Occupational Health - Occupational Stress Questionnaire    Feeling of Stress : Patient declined  Social Connections: Unknown (04/11/2023)   Social Connection and Isolation Panel [NHANES]    Frequency of Communication with Friends and Family: Patient declined    Frequency of Social Gatherings  with Friends and Family: Patient declined    Attends Religious Services: Patient declined    Active Member of Clubs or Organizations: Patient declined    Attends Banker Meetings: Not on file    Marital Status: Patient declined    Today's Vitals   04/15/23 1122  BP: 118/70  Pulse: 62  Resp: 12  Temp: 98.3 F (36.8 C)  TempSrc: Oral  SpO2: 97%  Weight: 185 lb (83.9 kg)  Height: 5\' 1"  (1.549 m)   Wt Readings from Last 3 Encounters:  04/15/23 185 lb (83.9 kg)  01/09/23 184 lb 6.4 oz (83.6 kg)  11/27/22 187 lb (84.8 kg)   Body mass index is 34.96 kg/m.  Physical Exam Vitals and nursing note reviewed.  Constitutional:      General: She is not in acute distress.    Appearance: She is well-developed.  HENT:     Head: Normocephalic and atraumatic.     Right Ear: Hearing normal. A middle ear effusion is present. Tympanic membrane is not erythematous.     Nose:     Right Turbinates: Enlarged.     Left Turbinates: Enlarged.     Right Sinus: No maxillary sinus tenderness or frontal sinus tenderness.     Left Sinus:  No maxillary sinus tenderness or frontal sinus tenderness.     Mouth/Throat:     Mouth: Mucous membranes are moist.     Pharynx: Oropharynx is clear.  Eyes:     Conjunctiva/sclera: Conjunctivae normal.  Cardiovascular:     Rate and Rhythm: Normal rate and regular rhythm.     Pulses:          Posterior tibial pulses are 2+ on the right side and 2+ on the left side.     Heart sounds: No murmur heard. Pulmonary:     Effort: Pulmonary effort is normal. No respiratory distress.     Breath sounds: Normal breath sounds.  Abdominal:     Palpations: Abdomen is soft. There is no hepatomegaly or mass.     Tenderness: There is no abdominal tenderness.  Musculoskeletal:     Cervical back: Neck supple.     Lumbar back: No tenderness. Negative right straight leg raise test and negative left straight leg raise test.     Right lower leg: No edema.     Left lower leg: No edema.  Lymphadenopathy:     Cervical: No cervical adenopathy.  Skin:    General: Skin is warm.     Findings: No erythema or rash.  Neurological:     General: No focal deficit present.     Mental Status: She is alert and oriented to person, place, and time.     Cranial Nerves: No cranial nerve deficit.     Gait: Gait normal.     Deep Tendon Reflexes:     Reflex Scores:      Patellar reflexes are 2+ on the right side and 2+ on the left side. Psychiatric:        Mood and Affect: Affect normal. Mood is anxious.    ASSESSMENT AND PLAN:  Ms. Amsler was seen today for chronic follow up.   Lab Results  Component Value Date   HGBA1C 6.1 04/15/2023   Lab Results  Component Value Date   MICROALBUR <0.7 04/15/2023   MICROALBUR <0.7 08/23/2021   Lab Results  Component Value Date   CHOL 184 04/15/2023   HDL 61.10 04/15/2023   LDLCALC 113 (H) 04/15/2023  TRIG 50.0 04/15/2023   CHOLHDL 3 04/15/2023   Lab Results  Component Value Date   NA 138 04/15/2023   CL 102 04/15/2023   K 4.1 04/15/2023   CO2 29 04/15/2023    BUN 10 04/15/2023   CREATININE 0.70 04/15/2023   GFR 100.19 04/15/2023   CALCIUM 9.5 04/15/2023   PHOS 2.2 (L) 02/04/2020   ALBUMIN 4.2 04/15/2023   GLUCOSE 95 04/15/2023   Lab Results  Component Value Date   ALT 12 04/15/2023   AST 16 04/15/2023   ALKPHOS 61 04/15/2023   BILITOT 0.4 04/15/2023   Chronic bilateral low back pain without sciatica Problem has been intermittent for some time. Lumbar X ray in 08/2012 and 08/2021: There is a stable rounded calcification measuring 9 mm left of L3, unchanged from 2014.  I do not think we need to repeat imaging at this time. If it becomes more frequent, PT can be arranged.  Type 2 diabetes mellitus with other specified complication, without long-term current use of insulin (HCC) HgA1C at goal. Continue non pharmacologic treatment. Annual eye exam, periodic dental and foot care recommended. F/U in 5-6 months  -     POCT glycosylated hemoglobin (Hb A1C) -     Microalbumin / creatinine urine ratio; Future  Right-sided headache We discussed possible etiologies, including allergies and tension headache. Hx and examination do not suggest a serious process. She agrees with holding on imaging. Flonase nasal spray daily for 10-14 days recommended.  Hyperlipidemia associated with type 2 diabetes mellitus (HCC) Continue Pravastatin 20 mg daily. Last LDL 97 in 09/2022.  -     Comprehensive metabolic panel; Future -     Lipid panel; Future  Earache on right We discussed possible etiologies. Examination does not suggest an active infectious process. Most likely eustachian tube dysfunction. OTC decongestants may help, discuss some side effects. Recommend autoinflation maneuvers a few times during the day.  Recurrent genital herpes simplex type 2 infection For the past 4-6 months she has had more frequent episodes, 2. Continue Valtrex as needed.  -     valACYclovir HCl; TAKE 1 TABLET BY MOUTH DAILY FOR 5 DAYS to take during acute episodes  and WITHIN 48 HOURS.  Dispense: 20 tablet; Refill: 2  Class 2 severe obesity with serious comorbidity and body mass index (BMI) of 36.0 to 36.9 in adult, unspecified obesity type [facility])  She is going to call [facility] to arrange appt, referral was placed in 09/2022. Patient understands the benefits of wt loss as well as adverse effects of obesity. Consistency with healthy diet and physical activity encouraged.  I spent a total of 40 minutes in both face to face and non face to face activities for this visit on the date of this encounter. During this time history was obtained and documented, examination was performed, prior labs/imaging reviewed, and assessment/plan discussed.  Return in about 6 months (around 10/13/2023) for chronic problems.  I, Suanne Marker, acting as a scribe for Rudean Icenhour [place], MD., have documented all relevant documentation on the behalf of Jax Kentner [place], MD, as directed by  Lasean Rahming [place], MD while in the presence of Zeppelin Beckstrand [place], MD.   I, Suanne Marker, have reviewed all documentation for this visit. The documentation on 04/15/23 for the exam, diagnosis, procedures, and orders are all accurate and complete.  Jaylaa Gallion G. [place], MD  [facility]. [place] office.

## 2023-04-16 LAB — MICROALBUMIN / CREATININE URINE RATIO
Creatinine,U: 182.4 mg/dL
Microalb Creat Ratio: 0.4 mg/g (ref 0.0–30.0)
Microalb, Ur: <0.7 mg/dL (ref 0.0–1.9)

## 2023-04-19 ENCOUNTER — Ambulatory Visit: Payer: PRIVATE HEALTH INSURANCE | Admitting: Family Medicine

## 2023-04-23 ENCOUNTER — Other Ambulatory Visit: Payer: Self-pay

## 2023-04-23 MED ORDER — ROSUVASTATIN CALCIUM 20 MG PO TABS: 20.0000 mg | ORAL_TABLET | Freq: Every day | ORAL | 3 refills | Status: DC

## 2023-09-01 IMAGING — MG MM DIGITAL SCREENING BILAT W/ TOMO AND CAD
8 series · 9 of 24 positions shown · non-contrast
Comparison: Previous exam(s).

CLINICAL DATA: Screening.

EXAM:
DIGITAL SCREENING BILATERAL MAMMOGRAM WITH TOMOSYNTHESIS AND CAD
TECHNIQUE: Bilateral screening digital craniocaudal and mediolateral oblique
mammograms were obtained. Bilateral screening digital breast
tomosynthesis was performed. The images were evaluated with
computer-aided detection.

[R CC synth-2D]
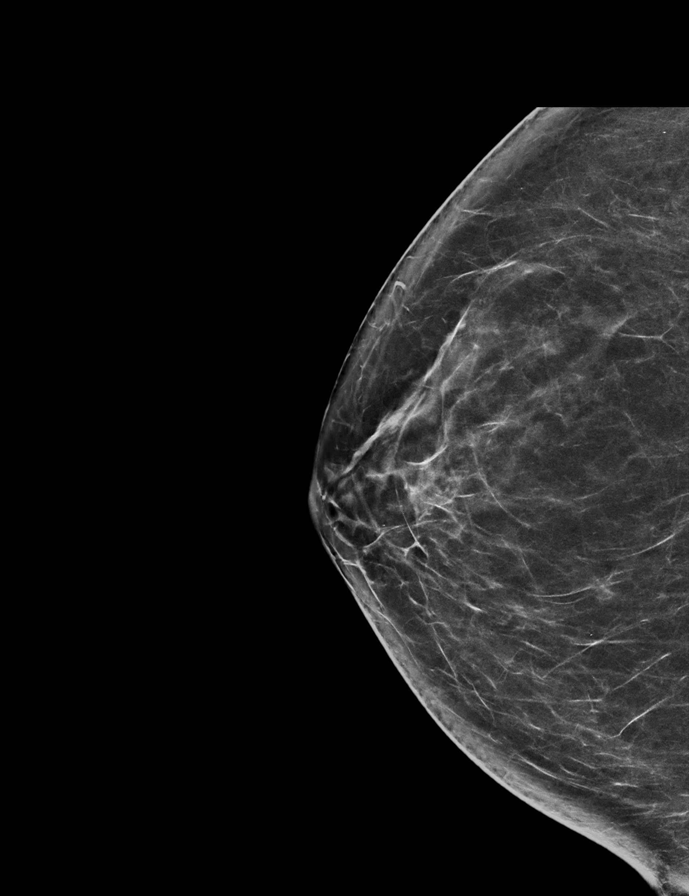

[L MLO synth-2D]
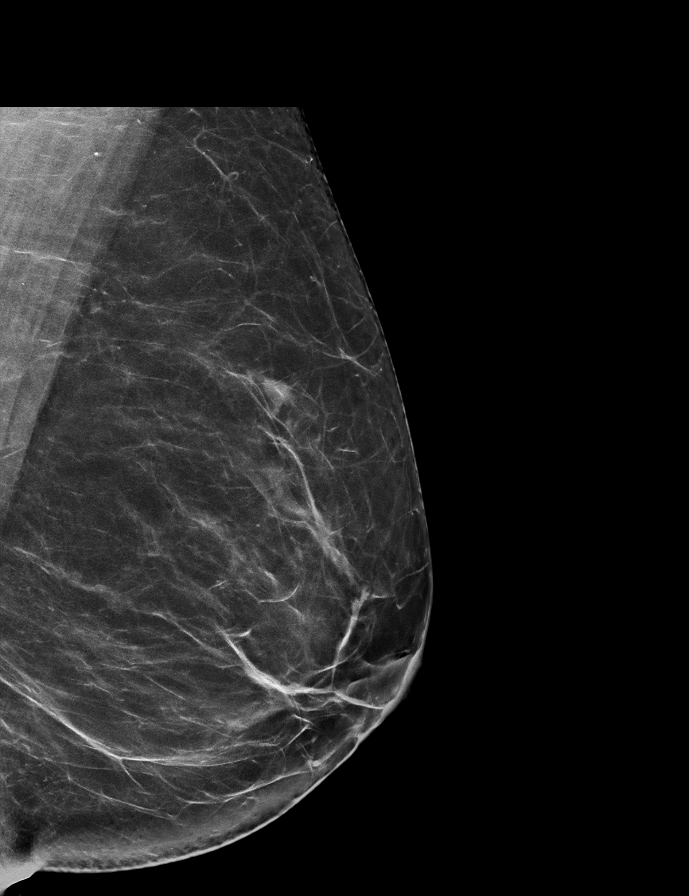

[L CC synth-2D]
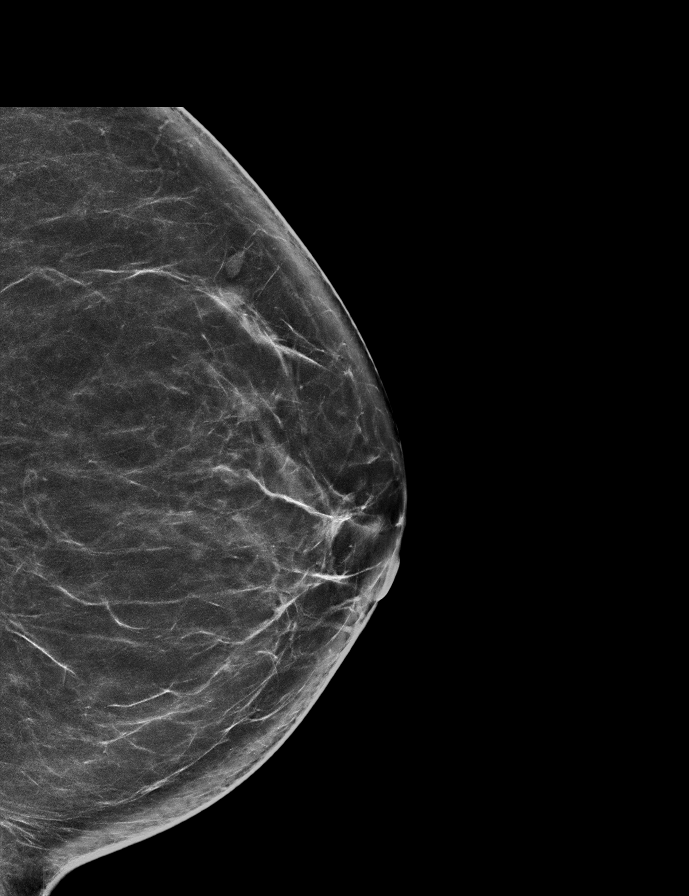

[R MLO synth-2D]
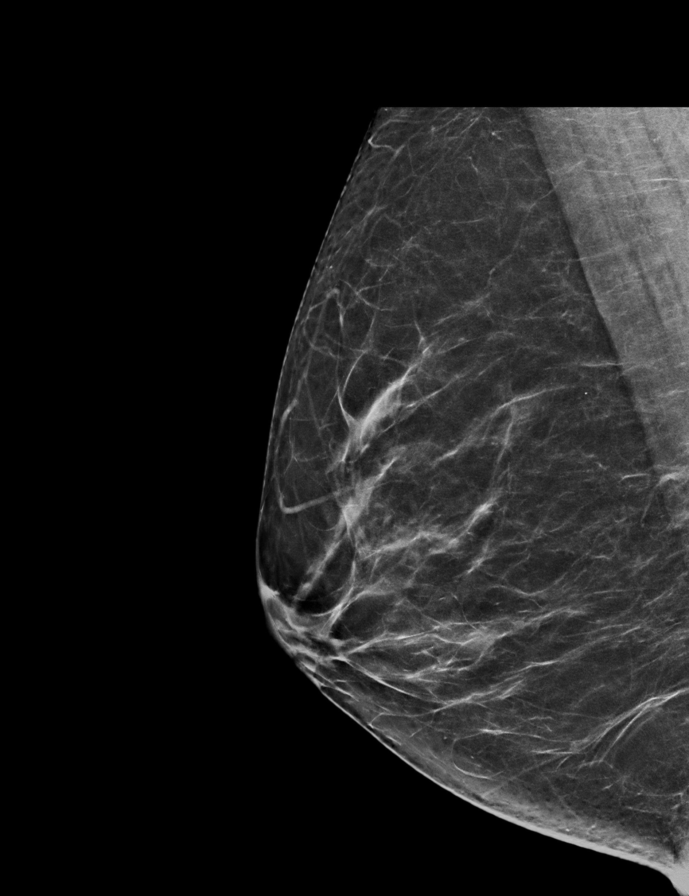

[R CC tomo · 2 of 71 frames shown]
[frame 23/71]
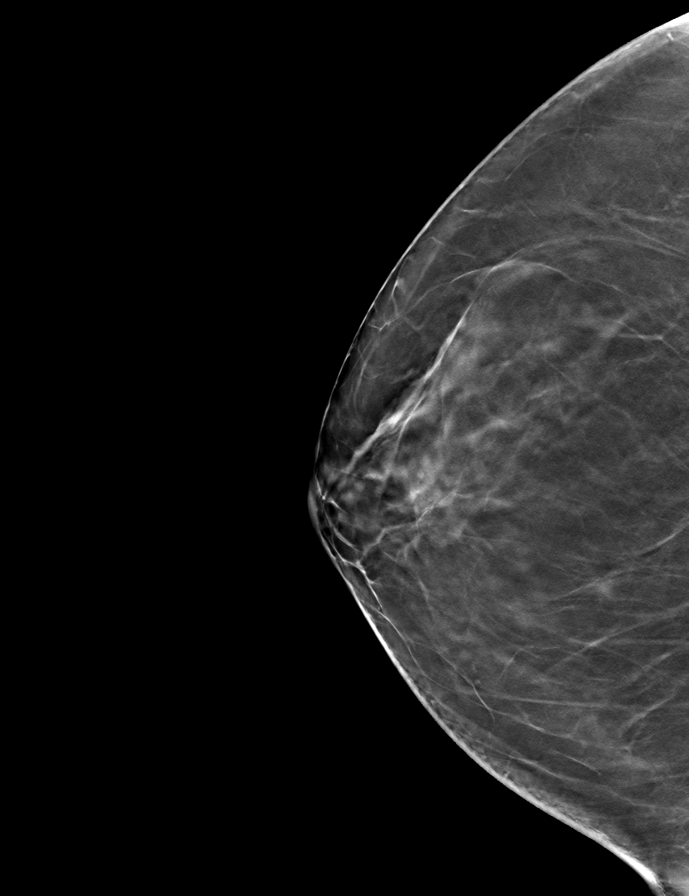
[frame 36/71]
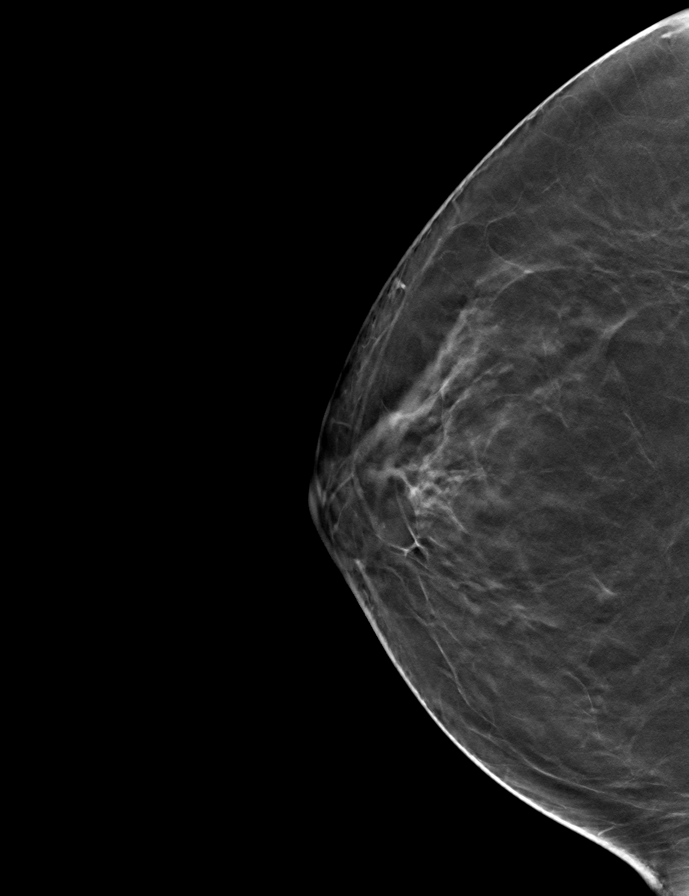

[L MLO tomo · tomo slice 39/77.0]
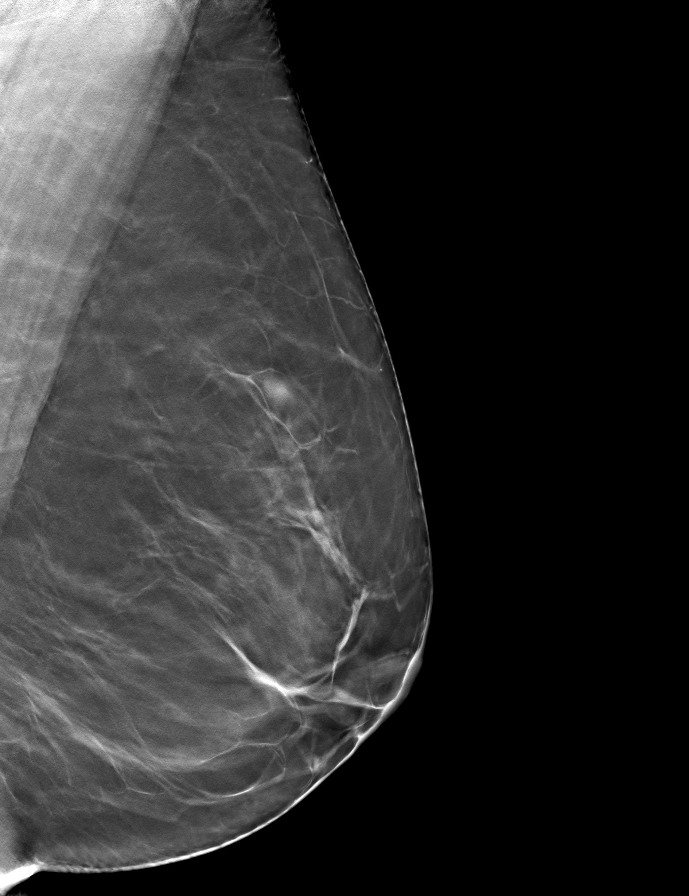

[R MLO tomo · tomo slice 35/70.0]
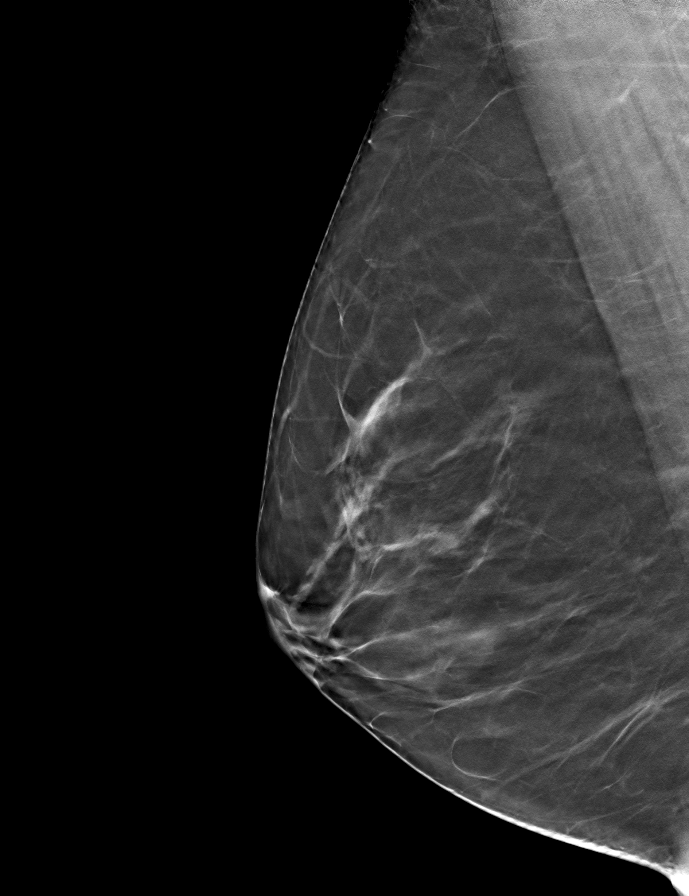

[L CC tomo · tomo slice 39/76.0]
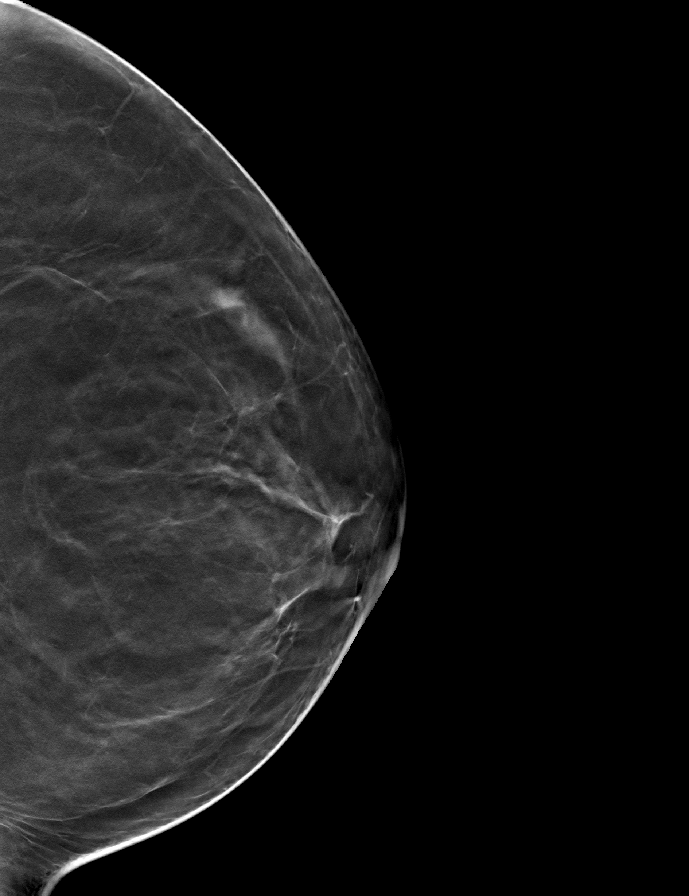

[9 of 24 positions shown; findings below may reference images not displayed]

ACR Breast Density Category b: There are scattered areas of
fibroglandular density.
FINDINGS: There are no findings suspicious for malignancy.
IMPRESSION: No mammographic evidence of malignancy. A result letter of this
screening mammogram will be mailed directly to the patient.

RECOMMENDATION:
Screening mammogram in one year. (Code:51-O-LD2)

BI-RADS CATEGORY  1: Negative.

## 2023-10-15 ENCOUNTER — Ambulatory Visit: Payer: Self-pay | Admitting: Family Medicine

## 2023-10-16 NOTE — Progress Notes (Incomplete)
 HPI: [name] is a 53 y.o. female with a PMHx significant for GERD, HLD, DM II, chronic headache, and heart palpitations, who is here today for chronic disease management.  Last seen on 04/15/2023  *** Review of Systems See other pertinent positives and negatives in HPI.  Current Outpatient Medications on File Prior to Visit  Medication Sig Dispense Refill   acetaminophen  (TYLENOL ) 500 MG tablet Take 1,000 mg by mouth every 6 (six) hours as needed for mild pain or fever.     fluticasone  (FLONASE ) 50 MCG/ACT nasal spray Place 1 spray into both nostrils 2 (two) times daily. 16 g 3   ibuprofen  (ADVIL ) 600 MG tablet Take 1 tablet (600 mg total) by mouth every 8 (eight) hours as needed. 21 tablet 0   methocarbamol  (ROBAXIN ) 500 MG tablet Take 1 tablet (500 mg total) by mouth daily as needed for muscle spasms. 30 tablet 1   omeprazole  (PRILOSEC) 40 MG capsule Take 1 capsule (40 mg total) by mouth daily. 90 capsule 2   rosuvastatin  (CRESTOR ) 20 MG tablet Take 1 tablet (20 mg total) by mouth daily. 90 tablet 3   SUMAtriptan  (IMITREX ) 50 MG tablet Take 1 tablet (50 mg total) by mouth daily as needed for migraine. May repeat in 2 hours if headache persists or recurs. 10 tablet 0   valACYclovir  (VALTREX ) 1000 MG tablet TAKE 1 TABLET BY MOUTH DAILY FOR 5 DAYS to take during acute episodes and WITHIN 48 HOURS. 20 tablet 2   No current facility-administered medications on file prior to visit.    Past Medical History:  Diagnosis Date   Atypical chest pain    secondary to GERD   Chronic kidney disease    Diabetes mellitus without complication (HCC)    GERD (gastroesophageal reflux disease)    Head ache    Right sided    Obesity    No Known Allergies  Social History   Socioeconomic History   Marital status: Married    Spouse name: Not on file   Number of children: 1   Years of education: Not on file   Highest education level: Bachelor's degree (e.g., BA, AB, BS)   Occupational History   Occupation: CNA  Tobacco Use   Smoking status: Never   Smokeless tobacco: Never  Vaping Use   Vaping status: Never Used  Substance and Sexual Activity   Alcohol use: No    Alcohol/week: 0.0 standard drinks of alcohol   Drug use: No   Sexual activity: Yes    Birth control/protection: Surgical  Other Topics Concern   Not on file  Social History Narrative   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    Social Drivers of Health   Financial Resource Strain: Patient Declined (04/15/2023)   Overall Financial Resource Strain (CARDIA)    Difficulty of Paying Living Expenses: Patient declined  Food Insecurity: Patient Declined (04/15/2023)   Hunger Vital Sign    Worried About Running Out of Food in the Last Year: Patient declined    Ran Out of Food in the Last Year: Patient declined  Transportation Needs: Patient Declined (04/15/2023)   PRAPARE - Administrator, Civil Service (Medical): Patient declined    Lack of Transportation (Non-Medical): Patient declined  Physical Activity: Unknown (04/15/2023)   Exercise Vital Sign    Days of  Exercise per Week: Patient declined    Minutes of Exercise per Session: 40 min  Stress: Patient Declined (04/15/2023)   [employer] of Occupational Health - Occupational Stress Questionnaire    Feeling of Stress : Patient declined  Social Connections: Unknown (04/15/2023)   Social Connection and Isolation Panel [NHANES]    Frequency of Communication with Friends and Family: Patient declined    Frequency of Social Gatherings with Friends and Family: Patient declined    Attends Religious Services: Patient declined    Database administrator or Organizations: Patient declined    Attends Banker Meetings: Not on file    Marital Status: Patient declined    There were no vitals filed for this visit. There is no height or weight on file  to calculate BMI.  Physical Exam  ASSESSMENT AND PLAN:  [name] was seen today for chronic disease management.   No orders of the defined types were placed in this encounter.  No problem-specific Assessment & Plan notes found for this encounter.  No follow-ups on file.  I, Fritz Jewel Wierda, acting as a scribe for Betty [place], MD., have documented all relevant documentation on the behalf of Betty [place], MD, as directed by  Betty [place], MD while in the presence of Betty [place], MD.   I, Betty [place], MD, have reviewed all documentation for this visit. The documentation on 10/16/23 for the exam, diagnosis, procedures, and orders are all accurate and complete.  Betty G. [place], MD  [facility]. [place] office.

## 2023-10-18 ENCOUNTER — Ambulatory Visit: Payer: Self-pay | Admitting: Family Medicine

## 2023-11-06 NOTE — Progress Notes (Incomplete)
 HPI: Ms.Braleigh D Demedeiros is a 53 y.o. female, who is here today for chronic disease management.  Last seen on ***  *** Review of Systems See other pertinent positives and negatives in HPI.  Current Outpatient Medications on File Prior to Visit  Medication Sig Dispense Refill   acetaminophen  (TYLENOL ) 500 MG tablet Take 1,000 mg by mouth every 6 (six) hours as needed for mild pain or fever.     fluticasone  (FLONASE ) 50 MCG/ACT nasal spray Place 1 spray into both nostrils 2 (two) times daily. 16 g 3   ibuprofen  (ADVIL ) 600 MG tablet Take 1 tablet (600 mg total) by mouth every 8 (eight) hours as needed. 21 tablet 0   methocarbamol  (ROBAXIN ) 500 MG tablet Take 1 tablet (500 mg total) by mouth daily as needed for muscle spasms. 30 tablet 1   omeprazole  (PRILOSEC) 40 MG capsule Take 1 capsule (40 mg total) by mouth daily. 90 capsule 2   rosuvastatin  (CRESTOR ) 20 MG tablet Take 1 tablet (20 mg total) by mouth daily. 90 tablet 3   SUMAtriptan  (IMITREX ) 50 MG tablet Take 1 tablet (50 mg total) by mouth daily as needed for migraine. May repeat in 2 hours if headache persists or recurs. 10 tablet 0   valACYclovir  (VALTREX ) 1000 MG tablet TAKE 1 TABLET BY MOUTH DAILY FOR 5 DAYS to take during acute episodes and WITHIN 48 HOURS. 20 tablet 2   No current facility-administered medications on file prior to visit.    Past Medical History:  Diagnosis Date   Atypical chest pain    secondary to GERD   Chronic kidney disease    Diabetes mellitus without complication (HCC)    GERD (gastroesophageal reflux disease)    Head ache    Right sided    Obesity    No Known Allergies  Social History   Socioeconomic History   Marital status: Married    Spouse name: Not on file   Number of children: 1   Years of education: Not on file   Highest education level: Bachelor's degree (e.g., BA, AB, BS)  Occupational History   Occupation: CNA  Tobacco Use   Smoking status: Never   Smokeless tobacco:  Never  Vaping Use   Vaping status: Never Used  Substance and Sexual Activity   Alcohol use: No    Alcohol/week: 0.0 standard drinks of alcohol   Drug use: No   Sexual activity: Yes    Birth control/protection: Surgical  Other Topics Concern   Not on file  Social History Narrative   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    Social Drivers of Health   Financial Resource Strain: Patient Declined (04/15/2023)   Overall Financial Resource Strain (CARDIA)    Difficulty of Paying Living Expenses: Patient declined  Food Insecurity: Patient Declined (04/15/2023)   Hunger Vital Sign    Worried About Running Out of Food in the Last Year: Patient declined    Ran Out of Food in the Last Year: Patient declined  Transportation Needs: Patient Declined (04/15/2023)   PRAPARE - Administrator, Civil Service (Medical): Patient declined    Lack of Transportation (Non-Medical): Patient declined  Physical Activity: Unknown (04/15/2023)   Exercise Vital Sign    Days of Exercise per Week: Patient declined    Minutes of Exercise per Session: Not  on file  Stress: Patient Declined (04/15/2023)   [employer] of Occupational Health - Occupational Stress Questionnaire    Feeling of Stress : Patient declined  Social Connections: Unknown (04/15/2023)   Social Connection and Isolation Panel    Frequency of Communication with Friends and Family: Patient declined    Frequency of Social Gatherings with Friends and Family: Patient declined    Attends Religious Services: Patient declined    Database administrator or Organizations: Patient declined    Attends Banker Meetings: Not on file    Marital Status: Patient declined    There were no vitals filed for this visit. There is no height or weight on file to calculate BMI.  Physical Exam  ASSESSMENT AND PLAN:  @ASSESSPLAN @  No orders of the defined types  were placed in this encounter.   No problem-specific Assessment & Plan notes found for this encounter.   No follow-ups on file.  Betty G. [place], MD  [facility]. [place] office.

## 2023-11-13 ENCOUNTER — Ambulatory Visit: Payer: Self-pay | Admitting: Family Medicine

## 2024-02-18 ENCOUNTER — Ambulatory Visit (INDEPENDENT_AMBULATORY_CARE_PROVIDER_SITE_OTHER): Payer: Self-pay | Admitting: Family Medicine

## 2024-02-18 ENCOUNTER — Ambulatory Visit: Payer: Self-pay | Admitting: Family Medicine

## 2024-02-18 ENCOUNTER — Encounter: Payer: Self-pay | Admitting: Family Medicine

## 2024-02-18 VITALS — BP 102/68 | HR 80 | Temp 98.3°F | Resp 16 | Ht 62.0 in | Wt 176.6 lb

## 2024-02-18 DIAGNOSIS — Z0000 Encounter for general adult medical examination without abnormal findings: Secondary | ICD-10-CM

## 2024-02-18 DIAGNOSIS — R519 Headache, unspecified: Secondary | ICD-10-CM

## 2024-02-18 DIAGNOSIS — A6000 Herpesviral infection of urogenital system, unspecified: Secondary | ICD-10-CM

## 2024-02-18 DIAGNOSIS — E785 Hyperlipidemia, unspecified: Secondary | ICD-10-CM

## 2024-02-18 DIAGNOSIS — R202 Paresthesia of skin: Secondary | ICD-10-CM

## 2024-02-18 DIAGNOSIS — E1169 Type 2 diabetes mellitus with other specified complication: Secondary | ICD-10-CM

## 2024-02-18 LAB — CBC
HCT: 38.8 % (ref 36.0–46.0)
Hemoglobin: 12.6 g/dL (ref 12.0–15.0)
MCHC: 32.6 g/dL (ref 30.0–36.0)
MCV: 95 fl (ref 78.0–100.0)
Platelets: 255 K/uL (ref 150.0–400.0)
RBC: 4.08 Mil/uL (ref 3.87–5.11)
RDW: 14 % (ref 11.5–15.5)
WBC: 4.4 K/uL (ref 4.0–10.5)

## 2024-02-18 LAB — COMPREHENSIVE METABOLIC PANEL WITH GFR
ALT: 11 U/L (ref 0–35)
AST: 16 U/L (ref 0–37)
Albumin: 4.3 g/dL (ref 3.5–5.2)
Alkaline Phosphatase: 53 U/L (ref 39–117)
BUN: 11 mg/dL (ref 6–23)
CO2: 29 meq/L (ref 19–32)
Calcium: 9.9 mg/dL (ref 8.4–10.5)
Chloride: 104 meq/L (ref 96–112)
Creatinine, Ser: 0.6 mg/dL (ref 0.40–1.20)
GFR: 103.37 mL/min (ref >60.00)
Glucose, Bld: 96 mg/dL (ref 70–99)
Potassium: 4.2 meq/L (ref 3.5–5.1)
Sodium: 140 meq/L (ref 135–145)
Total Bilirubin: 0.4 mg/dL (ref 0.2–1.2)
Total Protein: 7.4 g/dL (ref 6.0–8.3)

## 2024-02-18 LAB — LIPID PANEL
Cholesterol: 140 mg/dL (ref 0–200)
HDL: 60 mg/dL (ref >39.00)
LDL Cholesterol: 72 mg/dL (ref 0–99)
NonHDL: 80.26
Total CHOL/HDL Ratio: 2
Triglycerides: 40 mg/dL (ref 0.0–149.0)
VLDL: 8 mg/dL (ref 0.0–40.0)

## 2024-02-18 LAB — MICROALBUMIN / CREATININE URINE RATIO
Creatinine,U: 227.2 mg/dL
Microalb Creat Ratio: 6.3 mg/g (ref 0.0–30.0)
Microalb, Ur: 1.4 mg/dL (ref 0.0–1.9)

## 2024-02-18 LAB — VITAMIN B12: Vitamin B-12: 986 pg/mL — ABNORMAL HIGH (ref 211–911)

## 2024-02-18 LAB — HEMOGLOBIN A1C: Hgb A1c MFr Bld: 6.4 % (ref 4.6–6.5)

## 2024-02-18 LAB — TSH: TSH: 0.91 u[IU]/mL (ref 0.35–5.50)

## 2024-02-18 MED ORDER — VALACYCLOVIR HCL 1 G PO TABS: ORAL_TABLET | ORAL | 1 refills | Status: AC

## 2024-02-18 MED ORDER — ROSUVASTATIN CALCIUM 20 MG PO TABS: 20.0000 mg | ORAL_TABLET | Freq: Every day | ORAL | 3 refills | Status: AC

## 2024-02-18 NOTE — Patient Instructions (Addendum)
 A few things to remember from today's visit:  Type 2 diabetes mellitus with other specified complication, without long-term current use of insulin (HCC) - Plan: Microalbumin / creatinine urine ratio, Hemoglobin A1c  Recurrent genital herpes simplex type 2 infection - Plan: valACYclovir  (VALTREX ) 1000 MG tablet  Hyperlipidemia associated with type 2 diabetes mellitus (HCC) - Plan: Lipid panel, rosuvastatin  (CRESTOR ) 20 MG tablet, Comprehensive metabolic panel with GFR  Routine general medical examination at a health care facility  Headache, unspecified headache type - Plan: CBC  Tingling of both feet - Plan: CBC, TSH, Vitamin B12  Monitor for new symptoms associated with headache. Try over the counter Excedrin migraine. If more frequent we can consider medication and imaging.  If you need refills for medications you take chronically, please call your pharmacy. Do not use My Chart to request refills or for acute issues that need immediate attention. If you send a my chart message, it may take a few days to be addressed, specially if I am not in the office.  Please be sure medication list is accurate. If a new problem present, please set up appointment sooner than planned today.  Health Maintenance, Female Adopting a healthy lifestyle and getting preventive care are important in promoting health and wellness. Ask your health care provider about: The right schedule for you to have regular tests and exams. Things you can do on your own to prevent diseases and keep yourself healthy. What should I know about diet, weight, and exercise? Eat a healthy diet  Eat a diet that includes plenty of vegetables, fruits, low-fat dairy products, and lean protein. Do not eat a lot of foods that are high in solid fats, added sugars, or sodium. Maintain a healthy weight Body mass index (BMI) is used to identify weight problems. It estimates body fat based on height and weight. Your health care provider  can help determine your BMI and help you achieve or maintain a healthy weight. Get regular exercise Get regular exercise. This is one of the most important things you can do for your health. Most adults should: Exercise for at least 150 minutes each week. The exercise should increase your heart rate and make you sweat (moderate-intensity exercise). Do strengthening exercises at least twice a week. This is in addition to the moderate-intensity exercise. Spend less time sitting. Even light physical activity can be beneficial. Watch cholesterol and blood lipids Have your blood tested for lipids and cholesterol at 52 years of age, then have this test every 5 years. Have your cholesterol levels checked more often if: Your lipid or cholesterol levels are high. You are older than 52 years of age. You are at high risk for heart disease. What should I know about cancer screening? Depending on your health history and family history, you may need to have cancer screening at various ages. This may include screening for: Breast cancer. Cervical cancer. Colorectal cancer. Skin cancer. Lung cancer. What should I know about heart disease, diabetes, and high blood pressure? Blood pressure and heart disease High blood pressure causes heart disease and increases the risk of stroke. This is more likely to develop in people who have high blood pressure readings or are overweight. Have your blood pressure checked: Every 3-5 years if you are 71-22 years of age. Every year if you are 77 years old or older. Diabetes Have regular diabetes screenings. This checks your fasting blood sugar level. Have the screening done: Once every three years after age 34 if you are  at a normal weight and have a low risk for diabetes. More often and at a younger age if you are overweight or have a high risk for diabetes. What should I know about preventing infection? Hepatitis B If you have a higher risk for hepatitis B, you  should be screened for this virus. Talk with your health care provider to find out if you are at risk for hepatitis B infection. Hepatitis C Testing is recommended for: Everyone born from [birth year] through 1964. Anyone with known risk factors for hepatitis C. Sexually transmitted infections (STIs) Get screened for STIs, including gonorrhea and chlamydia, if: You are sexually active and are younger than 52 years of age. You are older than 52 years of age and your health care provider tells you that you are at risk for this type of infection. Your sexual activity has changed since you were last screened, and you are at increased risk for chlamydia or gonorrhea. Ask your health care provider if you are at risk. Ask your health care provider about whether you are at high risk for HIV. Your health care provider may recommend a prescription medicine to help prevent HIV infection. If you choose to take medicine to prevent HIV, you should first get tested for HIV. You should then be tested every 3 months for as long as you are taking the medicine. Pregnancy If you are about to stop having your period (premenopausal) and you may become pregnant, seek counseling before you get pregnant. Take 400 to 800 micrograms (mcg) of folic acid every day if you become pregnant. Ask for birth control (contraception) if you want to prevent pregnancy. Osteoporosis and menopause Osteoporosis is a disease in which the bones lose minerals and strength with aging. This can result in bone fractures. If you are 30 years old or older, or if you are at risk for osteoporosis and fractures, ask your health care provider if you should: Be screened for bone loss. Take a calcium  or vitamin D supplement to lower your risk of fractures. Be given hormone replacement therapy (HRT) to treat symptoms of menopause. Follow these instructions at home: Alcohol use Do not drink alcohol if: Your health care provider tells you not to drink. You  are pregnant, may be pregnant, or are planning to become pregnant. If you drink alcohol: Limit how much you have to: 0-1 drink a day. Know how much alcohol is in your drink. In the [place] one drink equals one 12 oz bottle of beer (355 mL), one 5 oz glass of wine (148 mL), or one 1 oz glass of hard liquor (44 mL). Lifestyle Do not use any products that contain nicotine or tobacco. These products include cigarettes, chewing tobacco, and vaping devices, such as e-cigarettes. If you need help quitting, ask your health care provider. Do not use street drugs. Do not share needles. Ask your health care provider for help if you need support or information about quitting drugs. General instructions Schedule regular health, dental, and eye exams. Stay current with your vaccines. Tell your health care provider if: You often feel depressed. You have ever been abused or do not feel safe at home. Summary Adopting a healthy lifestyle and getting preventive care are important in promoting health and wellness. Follow your health care provider's instructions about healthy diet, exercising, and getting tested or screened for diseases. Follow your health care provider's instructions on monitoring your cholesterol and blood pressure. This information is not intended to replace advice given to you by  your health care provider. Make sure you discuss any questions you have with your health care provider. Document Revised: 09/26/2020 Document Reviewed: 09/26/2020 Elsevier Patient Education  2024 [employer].

## 2024-02-18 NOTE — Assessment & Plan Note (Signed)
 Co morbilities: HLD,obesity,GERD. Problem is adequately controlled with non pharmacologist treatment. Last HgA1C 6.1 in 03/2023. Annual eye exam, periodic dental and foot care recommended. F/U in 5-6 months.

## 2024-02-18 NOTE — Assessment & Plan Note (Signed)
 Stable. Continue Valtrex  at 1000 mg daily x 5 days as needed.

## 2024-02-18 NOTE — Progress Notes (Signed)
 Chief Complaint  Patient presents with   Annual Exam   grief    Husband passed away on 02-24-2024   Follow-up   Headache    Discussed the use of AI scribe software for clinical note transcription with the patient, who gave verbal consent to proceed.  History of Present Illness Beverly Townsend is a 53 year old female with a PMHx significant for GERD, HLD, DM II, chronic headache,back pain, and heart palpitation here today for CPE and follow up. Her husband recently passed away from a heart attack at the age of 86. Family staying with her.  Walking 2 times per week. Cooking more at home, eating vegetables several times per week. In general she sleeps well. She does not drink alcohol and no hx of tobacco use. She sees an eye care provider and dentis regularly.  Health Maintenance  Topic Date Due   Pneumococcal Vaccine for age over 50 (1 of 2 - PCV) Never done   Hepatitis B Vaccine (1 of 3 - 19+ 3-dose series) Never done   Zoster (Shingles) Vaccine (1 of 2) Never done   Breast Cancer Screening  09/24/2023   Eye exam for diabetics  11/02/2023   COVID-19 Vaccine (1 - 2024-25 season) Never done   Pap with HPV screening  04/14/2024*   Flu Shot  08/18/2024*   Hemoglobin A1C  08/17/2024   Yearly kidney function blood test for diabetes  02/17/2025   Yearly kidney health urinalysis for diabetes  02/17/2025   Complete foot exam   02/17/2025   Colon Cancer Screening  11/27/2027   DTaP/Tdap/Td vaccine (3 - Td or Tdap) 04/20/2032   Hepatitis C Screening  Completed   HIV Screening  Completed   HPV Vaccine  Aged Out   Meningitis B Vaccine  Aged Out  *Topic was postponed. The date shown is not the original due date.   Immunization History  Administered Date(s) Administered   Influenza Whole 08/14/2004, 03/14/2010   Influenza-Unspecified 02/19/2015   Td 04/08/2006   Tdap 04/20/2022   -She has been experiencing new headaches for approximately one month, localized to the right side of  her head, affecting the forehead, back of the head, ear, and nose, described as pressure-like. Each episode lasts for two to three days and has occurred two to three times in total. Tylenol  does not alleviate the pain. No nausea, vomiting, or nasal congestion is associated with these headaches. + Photophobia.   She has a history of migraines but notes that these current headaches differ from her typical migraines.   She is currently taking omeprazole  40 mg for peptic ulcer disease.  She also takes Valtrex  as needed for genital herpes, experiencing outbreaks three to four times a year. Hyperlipidemia: Currently she is on rosuvastatin  20 mg daily. Lab Results  Component Value Date   CHOL 184 04/15/2023   HDL 61.10 04/15/2023   LDLCALC 113 (H) 04/15/2023   TRIG 50.0 04/15/2023   CHOLHDL 3 04/15/2023   Lab Results  Component Value Date   ALT 12 04/15/2023   AST 16 04/15/2023   ALKPHOS 61 04/15/2023   BILITOT 0.4 04/15/2023   DM2: Dx'ed in 2019.  She is on nonpharmacologic treatment. She experiences tingling in her feet, which she associates with prolonged standing.  Her blood sugar levels range from 98 to 106.   Lab Results  Component Value Date   HGBA1C 6.1 04/15/2023   Review of Systems  Constitutional:  Positive for fatigue. Negative for  activity change, appetite change and fever.  HENT:  Positive for hearing loss (chronic). Negative for mouth sores, sore throat and trouble swallowing.   Eyes:  Negative for redness and visual disturbance.  Respiratory:  Negative for cough, shortness of breath and wheezing.   Cardiovascular:  Negative for chest pain and leg swelling.  Gastrointestinal:  Negative for abdominal pain, nausea and vomiting.  Endocrine: Negative for cold intolerance, heat intolerance, polydipsia, polyphagia and polyuria.  Genitourinary:  Negative for decreased urine volume, dysuria, hematuria, vaginal bleeding and vaginal discharge.  Musculoskeletal:  Negative for  gait problem and myalgias.  Skin:  Negative for color change and rash.  Allergic/Immunologic: Positive for environmental allergies.  Neurological:  Positive for headaches. Negative for syncope, facial asymmetry and weakness.  Hematological:  Negative for adenopathy. Does not bruise/bleed easily.  Psychiatric/Behavioral:  Negative for confusion and hallucinations.   All other systems reviewed and are negative.  Current Outpatient Medications on File Prior to Visit  Medication Sig Dispense Refill   acetaminophen  (TYLENOL ) 500 MG tablet Take 1,000 mg by mouth every 6 (six) hours as needed for mild pain or fever.     fluticasone  (FLONASE ) 50 MCG/ACT nasal spray Place 1 spray into both nostrils 2 (two) times daily. 16 g 3   omeprazole  (PRILOSEC) 40 MG capsule Take 1 capsule (40 mg total) by mouth daily. 90 capsule 2   SUMAtriptan  (IMITREX ) 50 MG tablet Take 1 tablet (50 mg total) by mouth daily as needed for migraine. May repeat in 2 hours if headache persists or recurs. 10 tablet 0   No current facility-administered medications on file prior to visit.   Past Medical History:  Diagnosis Date   Atypical chest pain    secondary to GERD   Chronic kidney disease    Diabetes mellitus without complication (HCC)    GERD (gastroesophageal reflux disease)    Head ache    Right sided    Obesity     Past Surgical History:  Procedure Laterality Date   ABDOMINAL HYSTERECTOMY     with BSO   BREAST CYST ASPIRATION     COLONOSCOPY  2016    No Known Allergies  Family History  Problem Relation Age of Onset   Cancer Mother        breast   Hypertension Mother    Breast cancer Mother    Cancer Father        colon in early 47's   Colon cancer Father        early 32's   Breast cancer Maternal Aunt    Stomach cancer Maternal Grandmother    Stroke Paternal Grandmother    Diabetes Paternal Grandmother    Diabetes Other    Colon polyps Neg Hx    Esophageal cancer Neg Hx    Rectal cancer Neg  Hx     Social History   Socioeconomic History   Marital status: Married    Spouse name: Not on file   Number of children: 1   Years of education: Not on file   Highest education level: Bachelor's degree (e.g., BA, AB, BS)  Occupational History   Occupation: CNA  Tobacco Use   Smoking status: Never   Smokeless tobacco: Never  Vaping Use   Vaping status: Never Used  Substance and Sexual Activity   Alcohol use: No    Alcohol/week: 0.0 standard drinks of alcohol   Drug use: No   Sexual activity: Yes    Birth control/protection: Surgical  Other Topics  Concern   Not on file  Social History Narrative   She is a CNA at [employer]    Married for 58 years    52 year old boy    She likes to cook and entertain.    Social Drivers of Health   Financial Resource Strain: Patient Declined (04/15/2023)   Overall Financial Resource Strain (CARDIA)    Difficulty of Paying Living Expenses: Patient declined  Food Insecurity: Patient Declined (04/15/2023)   Hunger Vital Sign    Worried About Running Out of Food in the Last Year: Patient declined    Ran Out of Food in the Last Year: Patient declined  Transportation Needs: Patient Declined (04/15/2023)   PRAPARE - Administrator, Civil Service (Medical): Patient declined    Lack of Transportation (Non-Medical): Patient declined  Physical Activity: Unknown (04/15/2023)   Exercise Vital Sign    Days of Exercise per Week: Patient declined    Minutes of Exercise per Session: Not on file  Stress: Patient Declined (04/15/2023)   [employer] of Occupational Health - Occupational Stress Questionnaire    Feeling of Stress : Patient declined  Social Connections: Unknown (04/15/2023)   Social Connection and Isolation Panel    Frequency of Communication with Friends and Family: Patient declined    Frequency of Social Gatherings with Friends and Family: Patient declined    Attends Religious Services: Patient declined    Automotive engineer or Organizations: Patient declined    Attends Banker Meetings: Not on file    Marital Status: Patient declined    Vitals:   02/18/24 1017  BP: 102/68  Pulse: 80  Resp: 16  Temp: 98.3 F (36.8 C)  SpO2: 100%   Body mass index is 32.3 kg/m.  Wt Readings from Last 3 Encounters:  02/18/24 176 lb 9.6 oz (80.1 kg)  04/15/23 185 lb (83.9 kg)  01/09/23 184 lb 6.4 oz (83.6 kg)   Physical Exam Vitals and nursing note reviewed.  Constitutional:      General: She is not in acute distress.    Appearance: She is well-developed.  HENT:     Head: Normocephalic and atraumatic.     Right Ear: Hearing, tympanic membrane, ear canal and external ear normal.     Left Ear: Hearing, tympanic membrane, ear canal and external ear normal.     Nose:     Right Turbinates: Enlarged.     Left Turbinates: Enlarged.     Right Sinus: No maxillary sinus tenderness or frontal sinus tenderness.     Left Sinus: No maxillary sinus tenderness or frontal sinus tenderness.     Mouth/Throat:     Mouth: Mucous membranes are moist.     Pharynx: Oropharynx is clear. Uvula midline.  Eyes:     Extraocular Movements: Extraocular movements intact.     Conjunctiva/sclera: Conjunctivae normal.     Pupils: Pupils are equal, round, and reactive to light.  Neck:     Thyroid : No thyroid  mass or thyromegaly.  Cardiovascular:     Rate and Rhythm: Normal rate and regular rhythm.     Pulses:          Dorsalis pedis pulses are 2+ on the right side and 2+ on the left side.     Heart sounds: No murmur heard. Pulmonary:     Effort: Pulmonary effort is normal. No respiratory distress.     Breath sounds: Normal breath sounds.  Abdominal:  Palpations: Abdomen is soft. There is no hepatomegaly or mass.     Tenderness: There is no abdominal tenderness.  Genitourinary:    Comments: No concerns today. Musculoskeletal:     Comments: No major deformity or signs of synovitis appreciated.   Lymphadenopathy:     Cervical: No cervical adenopathy.     Upper Body:     Right upper body: No supraclavicular adenopathy.     Left upper body: No supraclavicular adenopathy.  Skin:    General: Skin is warm.     Findings: No erythema or rash.  Neurological:     General: No focal deficit present.     Mental Status: She is alert and oriented to person, place, and time.     Cranial Nerves: No cranial nerve deficit.     Coordination: Coordination normal.     Gait: Gait normal.     Deep Tendon Reflexes:     Reflex Scores:      Bicep reflexes are 2+ on the right side and 2+ on the left side.      Patellar reflexes are 2+ on the right side and 2+ on the left side. Psychiatric:        Mood and Affect: Mood is not anxious. Affect is tearful.    ASSESSMENT AND PLAN: Ms. AFNAN CADIENTE was here today annual physical examination, follow up,and c/o headaches.  Orders Placed This Encounter  Procedures   Lipid panel   Comprehensive metabolic panel with GFR   Microalbumin / creatinine urine ratio   Hemoglobin A1c   CBC   TSH   Vitamin B12   Lab Results  Component Value Date   VITAMINB12 986 (H) 02/18/2024   Lab Results  Component Value Date   HGBA1C 6.4 02/18/2024   Lab Results  Component Value Date   NA 140 02/18/2024   CL 104 02/18/2024   K 4.2 02/18/2024   CO2 29 02/18/2024   BUN 11 02/18/2024   CREATININE 0.60 02/18/2024   GFR 103.37 02/18/2024   CALCIUM  9.9 02/18/2024   PHOS 2.2 (L) 02/04/2020   ALBUMIN 4.3 02/18/2024   GLUCOSE 96 02/18/2024   Lab Results  Component Value Date   ALT 11 02/18/2024   AST 16 02/18/2024   ALKPHOS 53 02/18/2024   BILITOT 0.4 02/18/2024   Lab Results  Component Value Date   CHOL 140 02/18/2024   HDL 60.00 02/18/2024   LDLCALC 72 02/18/2024   TRIG 40.0 02/18/2024   CHOLHDL 2 02/18/2024   Lab Results  Component Value Date   TSH 0.91 02/18/2024   Lab Results  Component Value Date   WBC 4.4 02/18/2024   HGB 12.6  02/18/2024   HCT 38.8 02/18/2024   MCV 95.0 02/18/2024   PLT 255.0 02/18/2024   Lab Results  Component Value Date   MICROALBUR 1.4 02/18/2024   Routine general medical examination at a health care facility Assessment & Plan: We discussed the importance of regular physical activity and healthy diet for prevention of chronic illness and/or complications. Preventive guidelines reviewed. Vaccination: Declines. Status post hysterectomy. Planning on arranging appointment for mammogram. Next CPE in a year.  Type 2 diabetes mellitus with other specified complication, without long-term current use of insulin (HCC) Assessment & Plan: Co morbilities: HLD,obesity,GERD. Problem is adequately controlled with non pharmacologist treatment. Last HgA1C 6.1 in 03/2023. Annual eye exam, periodic dental and foot care recommended. F/U in 5-6 months.  Orders: -     Microalbumin / creatinine urine ratio;  Future -     Hemoglobin A1c; Future  Hyperlipidemia associated with type 2 diabetes mellitus (HCC) Assessment & Plan: Currently on rosuvastatin  20 mg daily. Continue low-fat diet. Further recommendation will be given according to lipid panel result.  Orders: -     Lipid panel; Future -     Rosuvastatin  Calcium ; Take 1 tablet (20 mg total) by mouth daily.  Dispense: 90 tablet; Refill: 3 -     Comprehensive metabolic panel with GFR; Future  Headache, unspecified headache type Hx and examination today do not suggest a serious process. She has hx of chronic headache. I think we can hold on head imaging for now. Recommend trying Excedrin migraine. Instructed about warning signs.  -     CBC; Future  Tingling of both feet Chronic. Continue appropriate foot care. Further recommendations according to lab results.  -     CBC; Future -     TSH; Future -     Vitamin B12; Future  Recurrent genital herpes Assessment & Plan: Stable. Continue Valtrex  at 1000 mg daily x 5 days as  needed.  Orders: -     valACYclovir  HCl; TAKE 1 TABLET BY MOUTH DAILY FOR 5 DAYS to take during acute episodes and WITHIN 48 HOURS.  Dispense: 20 tablet; Refill: 1   Return in 6 months (on 08/17/2024) for chronic problems, before if headache gets worse.SABRA  Josip Merolla G. [place], MD  [facility]. [place] office.

## 2024-02-18 NOTE — Assessment & Plan Note (Signed)
 We discussed the importance of regular physical activity and healthy diet for prevention of chronic illness and/or complications. Preventive guidelines reviewed. Vaccination: Declines. Status post hysterectomy. Planning on arranging appointment for mammogram. Next CPE in a year.

## 2024-02-18 NOTE — Assessment & Plan Note (Signed)
 Currently on rosuvastatin 20 mg daily. Continue low-fat diet. Further recommendation will be given according to lipid panel result.

## 2024-03-04 ENCOUNTER — Telehealth: Payer: Self-pay

## 2024-03-04 NOTE — Telephone Encounter (Signed)
 Patient telephoned and requested a mammogram scholarship application. Confirmed patient's address. [facility]

## 2024-03-05 ENCOUNTER — Other Ambulatory Visit: Payer: Self-pay

## 2024-03-05 ENCOUNTER — Emergency Department (HOSPITAL_COMMUNITY)
Admission: EM | Admit: 2024-03-05 | Discharge: 2024-03-05 | Disposition: A | Discharge status: Home / Self Care | Payer: MEDICAID | Attending: Emergency Medicine | Admitting: Emergency Medicine

## 2024-03-05 ENCOUNTER — Emergency Department (HOSPITAL_COMMUNITY): Payer: MEDICAID

## 2024-03-05 ENCOUNTER — Encounter (HOSPITAL_COMMUNITY): Payer: Self-pay

## 2024-03-05 DIAGNOSIS — R079 Chest pain, unspecified: Secondary | ICD-10-CM

## 2024-03-05 DIAGNOSIS — N189 Chronic kidney disease, unspecified: Secondary | ICD-10-CM | POA: Insufficient documentation

## 2024-03-05 DIAGNOSIS — R072 Precordial pain: Secondary | ICD-10-CM | POA: Insufficient documentation

## 2024-03-05 LAB — COMPREHENSIVE METABOLIC PANEL WITH GFR
ALT: 14 U/L (ref 0–44)
AST: 24 U/L (ref 15–41)
Albumin: 4.4 g/dL (ref 3.5–5.0)
Alkaline Phosphatase: 61 U/L (ref 38–126)
Anion gap: 11 (ref 5–15)
BUN: 8 mg/dL (ref 6–20)
CO2: 27 mmol/L (ref 22–32)
Calcium: 10 mg/dL (ref 8.9–10.3)
Chloride: 101 mmol/L (ref 98–111)
Creatinine, Ser: 0.67 mg/dL (ref 0.44–1.00)
GFR, Estimated: >60 mL/min (ref >60)
Glucose, Bld: 94 mg/dL (ref 70–99)
Potassium: 4.8 mmol/L (ref 3.5–5.1)
Sodium: 139 mmol/L (ref 135–145)
Total Bilirubin: 0.4 mg/dL (ref 0.0–1.2)
Total Protein: 7.5 g/dL (ref 6.5–8.1)

## 2024-03-05 LAB — CBC WITH DIFFERENTIAL/PLATELET
Abs Immature Granulocytes: 0.01 K/uL (ref 0.00–0.07)
Basophils Absolute: 0.1 K/uL (ref 0.0–0.1)
Basophils Relative: 2 %
Eosinophils Absolute: 0.1 K/uL (ref 0.0–0.5)
Eosinophils Relative: 1 %
HCT: 41.1 % (ref 36.0–46.0)
Hemoglobin: 13.2 g/dL (ref 12.0–15.0)
Immature Granulocytes: 0 %
Lymphocytes Relative: 37 %
Lymphs Abs: 2 K/uL (ref 0.7–4.0)
MCH: 31.1 pg (ref 26.0–34.0)
MCHC: 32.1 g/dL (ref 30.0–36.0)
MCV: 96.7 fL (ref 80.0–100.0)
Monocytes Absolute: 0.6 K/uL (ref 0.1–1.0)
Monocytes Relative: 10 %
Neutro Abs: 2.6 K/uL (ref 1.7–7.7)
Neutrophils Relative %: 50 %
Platelets: 256 K/uL (ref 150–400)
RBC: 4.25 MIL/uL (ref 3.87–5.11)
RDW: 13.7 % (ref 11.5–15.5)
WBC: 5.3 K/uL (ref 4.0–10.5)
nRBC: 0 % (ref 0.0–0.2)

## 2024-03-05 LAB — TROPONIN T, HIGH SENSITIVITY: Troponin T High Sensitivity: <15 ng/L (ref 0–19)

## 2024-03-05 NOTE — Discharge Instructions (Signed)
 You were seen in the emergency department for chest pain and elevated blood pressure.  You had lab work EKG chest x-ray that did not show any significant findings.  Please follow-up with your primary care doctor for further evaluation.  Return to the emergency department if any worsening or concerning symptoms

## 2024-03-05 NOTE — ED Provider Notes (Signed)
 [place] [facility] AT [facility] Provider Note   CSN: [id number] Arrival date & time: 03/05/24  1503     Patient presents with: Chest Pain   Beverly Townsend is a 53 y.o. female.  She has a history of CKD, prediabetes.  She has had some central and left-sided chest pain radiating into her left shoulder that is been going on for 3 days.  Does not seem to be provoked with exertion.  She said she lost her husband about a month ago and it has been a big stress on her.  She is tearful at times during our interview.  She said she just wants to make sure she is not having a heart attack or stroke.  She denies any focal weakness.  Sometimes her vision is blurry.  Sometimes has tingling in her feet. no double vision.  No headache.  No history of cardiac disease.  Non-smoker no cocaine.   The history is provided by the patient.  Chest Pain Pain location:  Substernal area and L chest Pain quality: aching   Pain radiates to:  L shoulder Pain severity:  Moderate Onset quality:  Gradual Duration:  3 days Timing:  Intermittent Progression:  Unchanged Chronicity:  New Context: stress   Relieved by:  None tried Worsened by:  Nothing Ineffective treatments:  None tried Associated symptoms: no abdominal pain, no cough, no diaphoresis, no dizziness, no fever, no nausea, no shortness of breath, no syncope, no vomiting and no weakness   Risk factors: diabetes mellitus        Prior to Admission medications   Medication Sig Start Date End Date Taking? Authorizing Provider  acetaminophen  (TYLENOL ) 500 MG tablet Take 1,000 mg by mouth every 6 (six) hours as needed for mild pain or fever.    [provider]  fluticasone  (FLONASE ) 50 MCG/ACT nasal spray Place 1 spray into both nostrils 2 (two) times daily. 11/30/20   [place], Betty G, MD  omeprazole  (PRILOSEC) 40 MG capsule Take 1 capsule (40 mg total) by mouth daily. 11/09/22   [place], Betty G, MD  rosuvastatin  (CRESTOR )  20 MG tablet Take 1 tablet (20 mg total) by mouth daily. 02/18/24   [place], Betty G, MD  valACYclovir  (VALTREX ) 1000 MG tablet TAKE 1 TABLET BY MOUTH DAILY FOR 5 DAYS to take during acute episodes and WITHIN 48 HOURS. 02/18/24   [place], Betty G, MD    Allergies: Patient has no known allergies.    Review of Systems  Constitutional:  Negative for diaphoresis and fever.  Respiratory:  Negative for cough and shortness of breath.   Cardiovascular:  Positive for chest pain. Negative for syncope.  Gastrointestinal:  Negative for abdominal pain, nausea and vomiting.  Neurological:  Negative for dizziness and weakness.    Updated Vital Signs BP 111/78 (BP Location: Right Arm)   Pulse 75   Temp 98.7 F (37.1 C) (Oral)   Resp 16   Ht 5' 2 (1.575 m)   Wt 80.1 kg   SpO2 99%   BMI 32.30 kg/m   Physical Exam Vitals and nursing note reviewed.  Constitutional:      General: She is not in acute distress.    Appearance: She is well-developed.  HENT:     Head: Normocephalic and atraumatic.  Eyes:     Conjunctiva/sclera: Conjunctivae normal.  Cardiovascular:     Rate and Rhythm: Normal rate and regular rhythm.     Heart sounds: Normal heart sounds. No murmur heard.  Pulmonary:     Effort: Pulmonary effort is normal. No respiratory distress.     Breath sounds: Normal breath sounds.  Abdominal:     Palpations: Abdomen is soft.     Tenderness: There is no abdominal tenderness.  Musculoskeletal:        General: No swelling.     Cervical back: Neck supple.     Right lower leg: No tenderness. No edema.     Left lower leg: No tenderness. No edema.  Skin:    General: Skin is warm and dry.     Capillary Refill: Capillary refill takes less than 2 seconds.  Neurological:     General: No focal deficit present.     Mental Status: She is alert.     (all labs ordered are listed, but only abnormal results are displayed) Labs Reviewed  CBC WITH DIFFERENTIAL/PLATELET  COMPREHENSIVE METABOLIC  PANEL WITH GFR  TROPONIN T, HIGH SENSITIVITY    EKG: EKG Interpretation Date/Time:  Thursday March 05 2024 15:58:13 EDT Ventricular Rate:  73 PR Interval:  132 QRS Duration:  68 QT Interval:  372 QTC Calculation: 409 R Axis:   52  Text Interpretation: Normal sinus rhythm Normal ECG When compared with ECG of 23-Jul-2022 12:29, No significant change was found Confirmed by Towana Sharper [phone]) on 03/05/2024 4:02:20 PM  Radiology: DG Chest 2 View Result Date: 03/05/2024 EXAM: 2 VIEW(S) XRAY OF THE CHEST 03/05/2024 04:07:00 PM COMPARISON: 07/23/2022 CLINICAL HISTORY: chest pain. Table formatting from the original note was not included.; Images from the original note were not included.; Per triage; Pt arrived via [facility] c/o left side chest pain that feels tight. Pt reports she was at home cooking when pain came on suddenly, and pt reports pain radiates to her left arm. FINDINGS: LUNGS AND PLEURA: No focal pulmonary opacity. No pulmonary edema. No pleural effusion. No pneumothorax. HEART AND MEDIASTINUM: No acute abnormality of the cardiac and mediastinal silhouettes. BONES AND SOFT TISSUES: No acute osseous abnormality. IMPRESSION: 1. No acute cardiopulmonary process. Electronically signed by: Lynwood Seip MD 03/05/2024 04:28 PM EDT RP Workstation: [id number]     Procedures   Medications Ordered in the ED - No data to display                                  Medical Decision Making Amount and/or Complexity of Data Reviewed Labs: ordered. Radiology: ordered.   This patient complains of chest pain; this involves an extensive number of treatment Options and is a complaint that carries with it a high risk of complications and morbidity. The differential includes ACS, pneumonia, pneumothorax, PE, anxiety, reflux  I ordered, reviewed and interpreted labs, which included CBC normal chemistries LFTs normal troponins flat I ordered imaging studies which included chest x-ray and I  independently    visualized and interpreted imaging which showed no acute findings Previous records obtained and reviewed in epic including recent PCP notes Cardiac monitoring reviewed, normal sinus rhythm Social determinants considered, no significant barriers Critical Interventions: None  After the interventions stated above, I reevaluated the patient and found patient to be symptomatically feeling better and less anxious, vitals normalizing Admission and further testing considered, she is comfortable plan for discharge and outpatient follow-up with her PCP.  Return instructions discussed.      Final diagnoses:  Nonspecific chest pain    ED Discharge Orders     None  Towana Ozell BROCKS, MD 03/06/24 [phone]

## 2024-03-05 NOTE — ED Triage Notes (Signed)
 Pt arrived via [facility] c/o left side chest pain that feels tight. Pt reports she was at home cooking when pain came on suddenly, and pt reports pain radiates to her left arm. Pt also reports her husband recently passed away.

## 2024-03-13 ENCOUNTER — Other Ambulatory Visit: Payer: Self-pay | Admitting: Family Medicine

## 2024-03-13 DIAGNOSIS — Z1231 Encounter for screening mammogram for malignant neoplasm of breast: Secondary | ICD-10-CM

## 2024-04-01 ENCOUNTER — Ambulatory Visit (HOSPITAL_COMMUNITY): Payer: Self-pay

## 2024-04-27 ENCOUNTER — Ambulatory Visit (HOSPITAL_COMMUNITY): Payer: Self-pay

## 2024-05-04 ENCOUNTER — Inpatient Hospital Stay (HOSPITAL_COMMUNITY)
Admission: RE | Admit: 2024-05-04 | Discharge: 2024-05-04 | Payer: Self-pay | Attending: Family Medicine | Admitting: Family Medicine

## 2024-05-04 DIAGNOSIS — Z1231 Encounter for screening mammogram for malignant neoplasm of breast: Secondary | ICD-10-CM

## 2024-06-09 ENCOUNTER — Ambulatory Visit: Payer: Self-pay | Admitting: *Deleted

## 2024-06-09 NOTE — Telephone Encounter (Signed)
 FYI Only or Action Required?: FYI only for provider: appointment scheduled on 06/10/24.  Patient was last seen in primary care on 02/18/2024 by Jordan, Betty G, MD.  Called Nurse Triage reporting Cough.  Symptoms began a week ago.  Interventions attempted: OTC medications: mucinex  and Rest, hydration, or home remedies.  Symptoms are: gradually worsening.  Triage Disposition: See Physician Within 24 Hours  Patient/caregiver understands and will follow disposition?: Yes                Reason for Disposition  [1] Continuous (nonstop) coughing interferes with work or school AND [2] no improvement using cough treatment per Care Advice    No improvement with cough over weekend and now pressure in chest from cough.  Answer Assessment - Initial Assessment Questions No available appt with PCP tomorrow patient could make due to work. Scheduled appt tomorrow afternoon with other provider. Recommended if worsening sx go to ED.      1. ONSET: When did the cough begin?      1 week  2. SEVERITY: How bad is the cough today?      worsening 3. SPUTUM: Describe the color of your sputum (e.g., none, dry cough; clear, white, yellow, green)     Dry most of time but has had clear mucus at times 4. HEMOPTYSIS: Are you coughing up any blood? If Yes, ask: How much? (e.g., flecks, streaks, tablespoons, etc.)     Na  5. DIFFICULTY BREATHING: Are you having difficulty breathing? If Yes, ask: How bad is it? (e.g., mild, moderate, severe)      Denies  6. FEVER: Do you have a fever? If Yes, ask: What is your temperature, how was it measured, and when did it start?     No  7. CARDIAC HISTORY: Do you have any history of heart disease? (e.g., heart attack, congestive heart failure)      Na  8. LUNG HISTORY: Do you have any history of lung disease?  (e.g., pulmonary embolus, asthma, emphysema)     Na  9. PE RISK FACTORS: Do you have a history of blood clots? (or: recent  major surgery, recent prolonged travel, bedridden)     Na  10. OTHER SYMPTOMS: Do you have any other symptoms? (e.g., runny nose, wheezing, chest pain)       Dry cough , productive clear at times. Runny nose, chest pressure with coughing. No chest pain at rest, no difficulty breathing no fever no dizziness, no sweating no nausea.  11. PREGNANCY: Is there any chance you are pregnant? When was your last menstrual period?       na 12. TRAVEL: Have you traveled out of the country in the last month? (e.g., travel history, exposures)       na  Protocols used: Cough - Acute Productive-A-AH

## 2024-06-10 ENCOUNTER — Ambulatory Visit: Admitting: Family Medicine

## 2024-06-10 VITALS — BP 102/62 | HR 78 | Temp 98.7°F | Wt 169.8 lb

## 2024-06-10 DIAGNOSIS — J069 Acute upper respiratory infection, unspecified: Secondary | ICD-10-CM

## 2024-06-10 MED ORDER — BENZONATATE 100 MG PO CAPS: 100.0000 mg | ORAL_CAPSULE | Freq: Three times a day (TID) | ORAL | 0 refills | Status: AC | PRN

## 2024-06-10 NOTE — Progress Notes (Signed)
 "  Established Patient Office Visit  Subjective   Patient ID: Beverly Townsend, female    DOB: 1970-10-10  Age: 54 y.o. MRN: [id number]  Chief Complaint  Patient presents with   Cough        Nasal Congestion    HPI   Beverly Townsend is seen today as a work in with 1 week history of chest congestion, cough productive of clear sputum, nasal congestion.  Denies any fever.  No body aches.  No dyspnea.  Non-smoker.  She states several coworkers have had similar illness.  She has taken some over-the-counter Mucinex  DM.  Cough still quite bothersome at times.  She would like further cough medicine if possible.  Not aware of any wheezing.  Denies any nausea, vomiting, or diarrhea.  Past Medical History:  Diagnosis Date   Atypical chest pain    secondary to GERD   Chronic kidney disease    Diabetes mellitus without complication (HCC)    GERD (gastroesophageal reflux disease)    Head ache    Right sided    Obesity    Past Surgical History:  Procedure Laterality Date   ABDOMINAL HYSTERECTOMY     with BSO   BREAST CYST ASPIRATION     COLONOSCOPY  2016    reports that she has never smoked. She has never been exposed to tobacco smoke. She has never used smokeless tobacco. She reports that she does not drink alcohol and does not use drugs. family history includes Breast cancer in her maternal aunt and mother; Cancer in her father and mother; Colon cancer in her father; Diabetes in her paternal grandmother and another family member; Hypertension in her mother; Stomach cancer in her maternal grandmother; Stroke in her paternal grandmother. Allergies[1]  Review of Systems  Constitutional:  Negative for chills and fever.  HENT:  Positive for congestion. Negative for sinus pain.   Respiratory:  Positive for cough and sputum production. Negative for hemoptysis, shortness of breath and wheezing.   Cardiovascular:  Negative for chest pain.      Objective:     BP 102/62   Pulse 78   Temp 98.7  F (37.1 C) (Oral)   Wt 169 lb 12.8 oz (77 kg)   SpO2 96%   BMI 31.06 kg/m  BP Readings from Last 3 Encounters:  06/10/24 102/62  03/05/24 117/77  02/18/24 102/68   Wt Readings from Last 3 Encounters:  06/10/24 169 lb 12.8 oz (77 kg)  03/05/24 176 lb 9.6 oz (80.1 kg)  02/18/24 176 lb 9.6 oz (80.1 kg)      Physical Exam Vitals reviewed.  Constitutional:      General: She is not in acute distress.    Appearance: She is not ill-appearing.  HENT:     Right Ear: Tympanic membrane normal.     Left Ear: Tympanic membrane normal.     Mouth/Throat:     Mouth: Mucous membranes are moist.     Pharynx: Oropharynx is clear. No oropharyngeal exudate or posterior oropharyngeal erythema.  Cardiovascular:     Rate and Rhythm: Normal rate and regular rhythm.  Pulmonary:     Effort: Pulmonary effort is normal.     Breath sounds: Normal breath sounds. No wheezing or rales.  Musculoskeletal:     Cervical back: Neck supple.      No results found for any visits on 06/10/24.    The 10-year ASCVD risk score (Arnett DK, et al., 2019) is: 1.6%    Assessment &  Plan:   Problem List Items Addressed This Visit   None Visit Diagnoses       Viral URI with cough    -  Primary     Nonfocal exam.  Suspect viral URI.  Tessalon  Perles 100 mg every 8 hours as needed for cough.  Follow-up promptly for any fever or increasing shortness of breath.  No follow-ups on file.    Wolm Scarlet, MD     [1] No Known Allergies  "

## 2024-08-17 ENCOUNTER — Ambulatory Visit: Payer: Self-pay | Admitting: Family Medicine
